# Patient Record
Sex: Male | Born: 1945
Health system: Southern US, Community
[De-identification: ages and names within clinical notes are randomized; demographics above are authoritative.]

## PROBLEM LIST (undated history)

## (undated) DIAGNOSIS — M109 Gout, unspecified: Secondary | ICD-10-CM

## (undated) DIAGNOSIS — E079 Disorder of thyroid, unspecified: Secondary | ICD-10-CM

## (undated) DIAGNOSIS — E785 Hyperlipidemia, unspecified: Secondary | ICD-10-CM

## (undated) DIAGNOSIS — I10 Essential (primary) hypertension: Secondary | ICD-10-CM

## (undated) HISTORY — DX: Gout, unspecified: M10.9

## (undated) HISTORY — DX: Hyperlipidemia, unspecified: E78.5

## (undated) HISTORY — DX: Essential (primary) hypertension: I10

## (undated) HISTORY — DX: Disorder of thyroid, unspecified: E07.9

## (undated) HISTORY — PX: JOINT REPLACEMENT: SHX530

## (undated) HISTORY — PX: BACK SURGERY: SHX140

---

## 1999-07-30 ENCOUNTER — Encounter: Payer: Self-pay | Admitting: Neurosurgery

## 1999-07-30 ENCOUNTER — Ambulatory Visit (HOSPITAL_COMMUNITY): Admission: RE | Admit: 1999-07-30 | Discharge: 1999-07-30 | Payer: Self-pay | Admitting: Neurosurgery

## 1999-08-13 ENCOUNTER — Encounter: Payer: Self-pay | Admitting: Neurosurgery

## 1999-08-13 ENCOUNTER — Ambulatory Visit (HOSPITAL_COMMUNITY): Admission: RE | Admit: 1999-08-13 | Discharge: 1999-08-13 | Payer: Self-pay | Admitting: Neurosurgery

## 1999-08-27 ENCOUNTER — Encounter: Payer: Self-pay | Admitting: Neurosurgery

## 1999-08-27 ENCOUNTER — Ambulatory Visit (HOSPITAL_COMMUNITY): Admission: RE | Admit: 1999-08-27 | Discharge: 1999-08-27 | Payer: Self-pay | Admitting: Neurosurgery

## 1999-10-11 ENCOUNTER — Encounter: Payer: Self-pay | Admitting: Neurosurgery

## 1999-10-15 ENCOUNTER — Inpatient Hospital Stay (HOSPITAL_COMMUNITY): Admission: RE | Admit: 1999-10-15 | Discharge: 1999-10-20 | Payer: Self-pay | Admitting: Neurosurgery

## 1999-10-15 ENCOUNTER — Encounter: Payer: Self-pay | Admitting: Neurosurgery

## 1999-12-19 ENCOUNTER — Encounter: Admission: RE | Admit: 1999-12-19 | Discharge: 1999-12-19 | Payer: Self-pay | Admitting: Neurosurgery

## 1999-12-19 ENCOUNTER — Encounter: Payer: Self-pay | Admitting: Neurosurgery

## 2000-02-27 ENCOUNTER — Encounter: Admission: RE | Admit: 2000-02-27 | Discharge: 2000-02-27 | Payer: Self-pay | Admitting: Neurosurgery

## 2000-02-27 ENCOUNTER — Encounter: Payer: Self-pay | Admitting: Neurosurgery

## 2000-05-28 ENCOUNTER — Encounter: Payer: Self-pay | Admitting: Neurosurgery

## 2000-05-28 ENCOUNTER — Encounter: Admission: RE | Admit: 2000-05-28 | Discharge: 2000-05-28 | Payer: Self-pay | Admitting: Neurosurgery

## 2004-09-12 ENCOUNTER — Ambulatory Visit: Payer: Self-pay

## 2007-06-18 DIAGNOSIS — E782 Mixed hyperlipidemia: Secondary | ICD-10-CM | POA: Insufficient documentation

## 2007-10-20 DIAGNOSIS — R6882 Decreased libido: Secondary | ICD-10-CM | POA: Insufficient documentation

## 2007-10-20 HISTORY — DX: Decreased libido: R68.82

## 2008-06-12 ENCOUNTER — Ambulatory Visit: Payer: Self-pay | Admitting: Gastroenterology

## 2008-07-10 DIAGNOSIS — F1010 Alcohol abuse, uncomplicated: Secondary | ICD-10-CM | POA: Insufficient documentation

## 2009-10-12 HISTORY — PX: LUMBAR FUSION: SHX111

## 2009-10-12 HISTORY — PX: LUMBAR LAMINECTOMY: SHX95

## 2011-12-02 ENCOUNTER — Emergency Department: Payer: Self-pay | Admitting: Emergency Medicine

## 2013-08-10 ENCOUNTER — Ambulatory Visit: Payer: Self-pay | Admitting: Orthopedic Surgery

## 2013-08-10 DIAGNOSIS — I10 Essential (primary) hypertension: Secondary | ICD-10-CM

## 2013-08-10 LAB — PROTIME-INR
INR: 1
PROTHROMBIN TIME: 12.8 s (ref 11.5–14.7)

## 2013-08-10 LAB — BASIC METABOLIC PANEL
ANION GAP: 4 — AB (ref 7–16)
BUN: 11 mg/dL (ref 7–18)
CALCIUM: 9.8 mg/dL (ref 8.5–10.1)
CHLORIDE: 95 mmol/L — AB (ref 98–107)
Co2: 31 mmol/L (ref 21–32)
Creatinine: 1.3 mg/dL (ref 0.60–1.30)
EGFR (Non-African Amer.): 57 — ABNORMAL LOW
Glucose: 103 mg/dL — ABNORMAL HIGH (ref 65–99)
Osmolality: 260 (ref 275–301)
Potassium: 4 mmol/L (ref 3.5–5.1)
Sodium: 130 mmol/L — ABNORMAL LOW (ref 136–145)

## 2013-08-10 LAB — APTT: ACTIVATED PTT: 24.8 s (ref 23.6–35.9)

## 2013-08-10 LAB — CBC
HCT: 42.7 % (ref 40.0–52.0)
HGB: 14.1 g/dL (ref 13.0–18.0)
MCH: 31.3 pg (ref 26.0–34.0)
MCHC: 33 g/dL (ref 32.0–36.0)
MCV: 95 fL (ref 80–100)
PLATELETS: 182 10*3/uL (ref 150–440)
RBC: 4.51 10*6/uL (ref 4.40–5.90)
RDW: 16.3 % — ABNORMAL HIGH (ref 11.5–14.5)
WBC: 4.8 10*3/uL (ref 3.8–10.6)

## 2013-08-10 LAB — URINALYSIS, COMPLETE
BILIRUBIN, UR: NEGATIVE
Bacteria: NONE SEEN
Blood: NEGATIVE
Glucose,UR: NEGATIVE mg/dL (ref 0–75)
Ketone: NEGATIVE
LEUKOCYTE ESTERASE: NEGATIVE
NITRITE: NEGATIVE
PH: 7 (ref 4.5–8.0)
PROTEIN: NEGATIVE
Specific Gravity: 1.015 (ref 1.003–1.030)
Squamous Epithelial: NONE SEEN
WBC UR: <1 /HPF (ref 0–5)

## 2013-08-10 LAB — MRSA PCR SCREENING

## 2013-08-12 HISTORY — PX: REPLACEMENT TOTAL KNEE: SUR1224

## 2013-08-25 ENCOUNTER — Inpatient Hospital Stay: Payer: Self-pay | Admitting: Orthopedic Surgery

## 2013-08-26 LAB — CBC WITH DIFFERENTIAL/PLATELET
Basophil #: 0 10*3/uL (ref 0.0–0.1)
Basophil %: 0.2 %
Eosinophil #: 0 10*3/uL (ref 0.0–0.7)
Eosinophil %: 0.2 %
HCT: 34.7 % — ABNORMAL LOW (ref 40.0–52.0)
HGB: 11.4 g/dL — ABNORMAL LOW (ref 13.0–18.0)
Lymphocyte #: 0.5 10*3/uL — ABNORMAL LOW (ref 1.0–3.6)
Lymphocyte %: 7.4 %
MCH: 30.9 pg (ref 26.0–34.0)
MCHC: 32.9 g/dL (ref 32.0–36.0)
MCV: 94 fL (ref 80–100)
MONO ABS: 0.7 x10 3/mm (ref 0.2–1.0)
Monocyte %: 10.4 %
NEUTROS PCT: 81.8 %
Neutrophil #: 5.3 10*3/uL (ref 1.4–6.5)
PLATELETS: 140 10*3/uL — AB (ref 150–440)
RBC: 3.69 10*6/uL — AB (ref 4.40–5.90)
RDW: 15.4 % — ABNORMAL HIGH (ref 11.5–14.5)
WBC: 6.5 10*3/uL (ref 3.8–10.6)

## 2013-08-26 LAB — BASIC METABOLIC PANEL
ANION GAP: 7 (ref 7–16)
BUN: 11 mg/dL (ref 7–18)
CHLORIDE: 98 mmol/L (ref 98–107)
CO2: 27 mmol/L (ref 21–32)
Calcium, Total: 8.2 mg/dL — ABNORMAL LOW (ref 8.5–10.1)
Creatinine: 1.61 mg/dL — ABNORMAL HIGH (ref 0.60–1.30)
GFR CALC AF AMER: 51 — AB
GFR CALC NON AF AMER: 44 — AB
Glucose: 110 mg/dL — ABNORMAL HIGH (ref 65–99)
OSMOLALITY: 265 (ref 275–301)
Potassium: 3.8 mmol/L (ref 3.5–5.1)
Sodium: 132 mmol/L — ABNORMAL LOW (ref 136–145)

## 2013-08-29 LAB — BASIC METABOLIC PANEL
ANION GAP: 6 — AB (ref 7–16)
BUN: 30 mg/dL — ABNORMAL HIGH (ref 7–18)
CALCIUM: 8.6 mg/dL (ref 8.5–10.1)
Chloride: 97 mmol/L — ABNORMAL LOW (ref 98–107)
Co2: 28 mmol/L (ref 21–32)
Creatinine: 1.97 mg/dL — ABNORMAL HIGH (ref 0.60–1.30)
EGFR (Non-African Amer.): 34 — ABNORMAL LOW
GFR CALC AF AMER: 40 — AB
GLUCOSE: 115 mg/dL — AB (ref 65–99)
OSMOLALITY: 270 (ref 275–301)
POTASSIUM: 2.9 mmol/L — AB (ref 3.5–5.1)
Sodium: 131 mmol/L — ABNORMAL LOW (ref 136–145)

## 2013-08-29 LAB — CBC WITH DIFFERENTIAL/PLATELET
Basophil #: 0 10*3/uL (ref 0.0–0.1)
Basophil %: 0.3 %
EOS PCT: 1.1 %
Eosinophil #: 0.1 10*3/uL (ref 0.0–0.7)
HCT: 28.7 % — AB (ref 40.0–52.0)
HGB: 9.6 g/dL — AB (ref 13.0–18.0)
LYMPHS ABS: 0.7 10*3/uL — AB (ref 1.0–3.6)
Lymphocyte %: 11.9 %
MCH: 31.2 pg (ref 26.0–34.0)
MCHC: 33.5 g/dL (ref 32.0–36.0)
MCV: 93 fL (ref 80–100)
Monocyte #: 0.7 x10 3/mm (ref 0.2–1.0)
Monocyte %: 12.6 %
NEUTROS ABS: 4.1 10*3/uL (ref 1.4–6.5)
Neutrophil %: 74.1 %
Platelet: 184 10*3/uL (ref 150–440)
RBC: 3.08 10*6/uL — ABNORMAL LOW (ref 4.40–5.90)
RDW: 15.1 % — ABNORMAL HIGH (ref 11.5–14.5)
WBC: 5.5 10*3/uL (ref 3.8–10.6)

## 2013-08-29 LAB — CLOSTRIDIUM DIFFICILE(ARMC)

## 2013-08-30 LAB — CREATININE, SERUM
CREATININE: 2.18 mg/dL — AB (ref 0.60–1.30)
EGFR (African American): 35 — ABNORMAL LOW
EGFR (Non-African Amer.): 30 — ABNORMAL LOW

## 2013-08-30 LAB — BASIC METABOLIC PANEL
Anion Gap: 7 (ref 7–16)
BUN: 32 mg/dL — ABNORMAL HIGH (ref 7–18)
CHLORIDE: 101 mmol/L (ref 98–107)
CO2: 25 mmol/L (ref 21–32)
Calcium, Total: 8.5 mg/dL (ref 8.5–10.1)
Creatinine: 2.3 mg/dL — ABNORMAL HIGH (ref 0.60–1.30)
EGFR (African American): 33 — ABNORMAL LOW
EGFR (Non-African Amer.): 29 — ABNORMAL LOW
Glucose: 99 mg/dL (ref 65–99)
Osmolality: 273 (ref 275–301)
POTASSIUM: 3.5 mmol/L (ref 3.5–5.1)
SODIUM: 133 mmol/L — AB (ref 136–145)

## 2013-08-30 LAB — MAGNESIUM: MAGNESIUM: 1.7 mg/dL — AB

## 2013-08-31 LAB — CBC WITH DIFFERENTIAL/PLATELET
Basophil #: 0 10*3/uL (ref 0.0–0.1)
Basophil %: 0.4 %
Eosinophil #: 0.1 10*3/uL (ref 0.0–0.7)
Eosinophil %: 1.7 %
HCT: 28 % — ABNORMAL LOW (ref 40.0–52.0)
HGB: 9.4 g/dL — ABNORMAL LOW (ref 13.0–18.0)
Lymphocyte #: 0.6 10*3/uL — ABNORMAL LOW (ref 1.0–3.6)
Lymphocyte %: 9.5 %
MCH: 31.2 pg (ref 26.0–34.0)
MCHC: 33.6 g/dL (ref 32.0–36.0)
MCV: 93 fL (ref 80–100)
MONOS PCT: 9 %
Monocyte #: 0.6 x10 3/mm (ref 0.2–1.0)
Neutrophil #: 5 10*3/uL (ref 1.4–6.5)
Neutrophil %: 79.4 %
PLATELETS: 216 10*3/uL (ref 150–440)
RBC: 3.02 10*6/uL — ABNORMAL LOW (ref 4.40–5.90)
RDW: 15.2 % — AB (ref 11.5–14.5)
WBC: 6.2 10*3/uL (ref 3.8–10.6)

## 2013-08-31 LAB — BASIC METABOLIC PANEL
ANION GAP: 6 — AB (ref 7–16)
BUN: 23 mg/dL — ABNORMAL HIGH (ref 7–18)
Calcium, Total: 8.9 mg/dL (ref 8.5–10.1)
Chloride: 100 mmol/L (ref 98–107)
Co2: 26 mmol/L (ref 21–32)
Creatinine: 1.56 mg/dL — ABNORMAL HIGH (ref 0.60–1.30)
EGFR (African American): 53 — ABNORMAL LOW
EGFR (Non-African Amer.): 46 — ABNORMAL LOW
Glucose: 110 mg/dL — ABNORMAL HIGH (ref 65–99)
OSMOLALITY: 269 (ref 275–301)
POTASSIUM: 3.5 mmol/L (ref 3.5–5.1)
Sodium: 132 mmol/L — ABNORMAL LOW (ref 136–145)

## 2014-05-24 DIAGNOSIS — N182 Chronic kidney disease, stage 2 (mild): Secondary | ICD-10-CM | POA: Diagnosis not present

## 2014-05-24 DIAGNOSIS — I10 Essential (primary) hypertension: Secondary | ICD-10-CM | POA: Diagnosis not present

## 2014-08-05 NOTE — Op Note (Signed)
PATIENT NAME:  Lonnie Huber, Lonnie Huber MR#:  [id number] DATE OF BIRTH:  1945/12/15  DATE OF PROCEDURE:  08/25/2013  PREOPERATIVE DIAGNOSIS: Right knee tricompartmental osteoarthritis.   POSTOPERATIVE DIAGNOSIS:  Right knee tricompartmental osteoarthritis.   PROCEDURE: Right total knee arthroplasty.   SURGEON: Thornton Park, MD   ASSISTANT: Earnestine Leys, MD  ESTIMATED BLOOD LOSS: 50 mL.   COMPLICATIONS: None.   ANESTHESIA: Spinal.   TOURNIQUET TIME: 125 minutes.   IMPLANTS: DePuy Sigma size 4 posterior stabilized femoral component, DePuy size 5 rotating tibial platform and a DePuy tibial posterior articulated size 4 polyethylene insert, a DePuy 41 mm polyethylene patella component.   INDICATIONS FOR SURGERY: The patient is healthy 69 year old male who has had persistent right knee pain. He has advanced degenerative changes on x-ray including severe joint space narrowing, particularly in the lateral compartment, with marginal osteophyte formation, subchondral sclerosis and subchondral cyst formation. There are degenerative changes in the tricompartmental in all 3 compartments of the knee. I reviewed the risks and benefits of surgery with the patient prior to the date of surgery in my office. He understands the risks include infection requiring the removal of the prosthesis, nerve or blood vessel injury, knee stiffness, bleeding requiring blood transfusion, fracture, dislocation, failure of the hardware, persistent pain or instability and the need for further surgery. Medical risks include deep vein thrombosis and pulmonary embolism, myocardial infarction, stroke, pneumonia, respiratory failure and death. The patient understood these risks and signed consent form in my office.   PROCEDURE NOTE: The patient was met in the preoperative area on the morning of surgery. I marked the right knee with the word "yes" within the operative field according to the hospital's right site protocol. His history and  physical was updated. The patient was then brought to the Operating Room where he underwent a spinal anesthetic by the anesthesia service. He was then positioned supine on the operative table. A Foley catheter was inserted. He was prepped and draped in a sterile fashion. A tourniquet was applied to the right upper thigh. He was prepped and draped in sterile fashion.   A timeout was performed to verify the patient's name, date of birth, medical record number, correct site of surgery and correct procedure to be performed. It was used to confirm the patient had received antibiotics and that all appropriate instruments, implants and radiographic studies were available in the room. Once all in attendance were in agreement, the case began.   A midline incision was made with a #10 blade. Full-thickness skin flaps were created overlying the patient's right knee. A medial arthrotomy was then created using a deep #10 blade. The right patella was then reflected and the knee was flexed approximately 90 degrees. This allowed for debridement of the Hoffa's fat pad, the medial and lateral meniscus and the cruciate ligaments. Osteophytes were removed using a rongeur from the femur. A starting drill was then applied to the intercondylar notch of the femur and inserted into the femoral canal. A long intramedullary distal femoral cutting guide was then positioned in place. The patient had had approximately 5 to 10 degrees of flexion contracture. A distal femoral cut of 10 cm was then planned. Once the distal femoral cutting block was pinned into position, the intramedullary rod was removed. An oscillating saw was then used to create the distal femoral cut.   Attention was turned to the tibia. The external tibial alignment guide was then applied to the lower leg. This was set up to be in  alignment with the second ray of the right foot. The slope of the cutting guide was matched to the anterior tibia. Approximately 8 mm off the  medial high side was measured using a stylus. The proximal tibial cutting block was then pinned into position. A drop-down alignment guide was used to confirm a neutral alignment with the mechanical axis of the tibia. A cross pin was then placed. An oscillating saw was used to create the proximal tibial cut. The knee was then put into full extension and the extension block was used to measure ligament balancing. The patient was slightly tight medially and further osteophytes were removed from the medial tibia and an increased medial soft tissue sleeve was created to allow for partial release the medial collateral ligament.   The attention was then turned back to the distal femur. A posterior referencing guide was then applied to the distal femur and pinned into position. This allowed for 3 degrees of external rotation. The distal femur was then measured to be a size 4. The size 4 cutting block was then applied to the distal femur and pinned into position. The anterior, posterior and chamfer cuts were then made with this cutting block. The cutting block was then removed. The box cutting guide was then pinned into position on the distal femur. The intercondylar notch box was then created with an oscillating saw. This box was then rasped. The cutting block was then removed. The flexion block was then placed into position and had excellent ligament balancing.   The attention was turned to the tibia. A size 5 tibial tray had the best coverage of the tibial plateau. This was pinned into position. The reamer guide was then gently malleted into position on top of the tibial tray. The size 5 reamer was then used to create the entry hole into the proximal tibia and the flanges were created using a tibial punch. The pins were then removed. A size 10 trial was then applied. The knee was brought into full extension and found to have excellent range of motion and ligamentous stability. This occurred in both flexion and  extension in the midrange of flexion.   The attention was then turned to the patella. The patella was measured to be 26 mm in width. The best coverage was had with a size 41 patella. The patella cutting guide was then applied and approximately 11-1/2 to 12 mm was resected from the undersurface of the patella. Three peg holes were then drilled and a 41 mm patella button trial was applied. The knee was then taken through a full range of motion. There was excellent tracking of the patella.   All trial components were then removed. The knee was copiously irrigated using pulse lavage impregnated with GU. The center drill hole of the femur used for the intramedullary guide was then applied with bone grafting from the patient. After a secondary pulse lavage, all bony surfaces were dried. Methylmethacrylate was mixed on the back table and applied to all exposed bony surfaces. The tibial tray was then put into position first along with a posterior stabilized DePuy size 4 PFC Sigma component. A size 10 polyethylene trial was then placed and the knee was brought into full extension while the methylmethacrylate cured. The size 41 patella button was then applied to the undersurface of the patella using methylmethacrylate.   After the methylmethacrylate had cured, the knee was copiously irrigated. The patient received a dose of tranexamic acid to help with postop bleeding. The  actual 10 mm size 4 polyethylene insert was then placed.  Two limbs of Autovac drain were placed into the knee joint. The patient then had the medial arthrotomy closed with interrupted #1 Ethibond. Again, the wound was copiously irrigated using pulse lavage impregnated with GU antibiotics. The subcutaneous tissue was closed in 2 layers with 0 Vicryl and 2-0 Vicryl. The skin was approximated with staples. A dry sterile dressing was applied along with a Polar Care sleeve and then a knee immobilizer. The patient was then transferred to a hospital bed in  stable condition.   I was scrubbed and present for the entire case. I spoke with the patient's wife in the postoperative consultation room following the procedure to let her know the case had gone without complication and the patient was stable in the recovery room.   ____________________________ Timoteo Gaul, MD klk:cs D: 08/30/2013 14:12:28 ET T: 08/30/2013 14:53:48 ET JOB#: [id number]  cc: Timoteo Gaul, MD, <Dictator> Timoteo Gaul MD ELECTRONICALLY SIGNED 09/13/2013 23:16

## 2014-08-05 NOTE — Consult Note (Signed)
Brief Consult Note: Diagnosis: 69 yr old male with right knee arthoplasty with acute renal failure and hypokalemia.   Patient was seen by consultant.   Consult note dictated.   Comments: 69 yr old male with acute renal failure with hypokaemia:likley  Atn with hypotension and diarrhea related: now  recommend holding hctz and cozzar ,did not get  this am also;continue fluids,replace k ,recommned holding hctz at discharge for 2-3 days and can resume after that.may have  ckd as well.  Electronic Signatures: Katha HammingKonidena, Jerrold Haskell (MD)  (Signed 18-May-15 14:05)  Authored: Brief Consult Note   Last Updated: 18-May-15 14:05 by Katha HammingKonidena, Jari Dipasquale (MD)

## 2014-08-05 NOTE — Consult Note (Signed)
PATIENT NAME:  Lonnie Huber, Lonnie Huber MR#:  [id number] DATE OF BIRTH:  1945/10/01  DATE OF CONSULTATION:  08/29/2013  REFERRING PHYSICIAN:  Juanell FairlyKevin Krasinski, MD CONSULTING PHYSICIAN:  Katha HammingSnehalatha Ladonne Sharples, MD  PRIMARY CARE PHYSICIAN: Dennison MascotLemont Morrisey, MD  REASON FOR CONSULTATION: Acute renal failure, hypokalemia.   HISTORY OF PRESENT ILLNESS: The patient is a 69 year old male admitted to ortho service for osteoarthritis of the right knee and had a right total knee arthroplasty on May 14th. The patient's routine lab work today showed hyponatremia and hypokalemia with potassium of 2.9 and sodium 131. Dr. Martha ClanKrasinski called me for acute renal failure and hyperkalemia evaluation. The patient has been on Benicar and HCTZ since admission. The patient's blood pressure was low this morning, at around 90/60, so nurse did not give the medications for the blood pressure. the patient did not have any nausea, but has been having frequent stools since yesterday. The patient denies any other complaints. No abdominal pain and no fever here. The patient is progressing well with physical therapy.   PAST MEDICAL HISTORY: Significant for hypertension.   ALLERGIES: No known drug allergies.   SOCIAL HISTORY: No smoking. Occasional drinking. No drugs.   HOME MEDICATIONS: He takes Benicar/HCTZ 12.5/40 mg p.o. daily, Lipitor 40 mg daily, Enalapril 300 mg once a day.   CURRENT MEDICATIONS: He is on Ambien 5 mg p.r.n. for sleep. He is on olmesartan 40 mg daily, atorvastatin 40 mg daily,  HCTZ 12.5 mg p.o. daily.   REVIEW OF SYSTEMS:  CONSTITUTIONAL: Denies any fever. HEENT: The patient has no ear pain. No epistaxis. No difficulty swallowing.  GASTROINTESTINAL: No nausea. No vomiting. No abdominal pain.  GENITOURINARY: No dysuria. PULMONARY: Denies any trouble breathing.  CARDIOVASCULAR: No chest pain.  NEUROLOGIC: No history of strokes or TIAs.  PSYCH: The patient has no history of depression.  PHYSICAL  EXAMINATION: VITAL SIGNS: Temperature 97.9, heart rate 90, blood pressure this morning was 90/60 and repeat was 101/60.  GENERAL: The patient is alert, awake and oriented, an elderly male not in distress, answering questions appropriately.  HEAD: Atraumatic, normocephalic.  EYES: Pupils equal and reacting to light. Extraocular movements are intact.  EARS, NOSE, THROAT: No tympanic membrane congestion. No turbinate hypertrophy. No oropharyngeal erythema.  NECK: Supple. No JVD or carotid bruits.  HEART: S1 and S2 regular. No murmurs. Peripheral pulses intact, in pedal and dorsalis pedis. LUNGS: Clear to auscultation. No wheeze. No rales.  ABDOMEN: Soft, nontender, nondistended. Bowel sounds present. EXTREMITIES: Right knee covered with cold wrap. The patient has no clubbing or edema in the legs.  NEUROLOGIC: Alert, awake, oriented. No focal neurologic deficit. Cranial nerves II through XII intact. Power 5/5 in upper and lower extremities. Sensory intact. DTRs 2+ bilateral.  PSYCH: Mood and affect within normal limits.   DIAGNOSTIC DATA: Sodium is 131, potassium 2.9, chloride 97, bicarb 28, BUN 30, creatinine 1.97, glucose 115.  WBC 5.5, hemoglobin 9.6, hematocrit 28.7, platelets 184,000.  The patient's C. diff was sent this morning. It was negative.   Most recent labs on May 15th showed creatinine 1.61.   ASSESSMENT AND PLAN: The patient is a 69 year old male with right knee surgery found to have acute renal failure and hypokalemia, likely secondary to combination of diarrhea and taking HCTZ. I would recommend holding the HCTZ and Benicar at this time and start gentle hydration with potassium at 50 mL/h. Check BMP in the morning. If the patient's renal function improves, he will probably go home. Advised him to stop HCTZ for  2 days and restart it after. The patient may have acute renal failure and chronic kidney disease stage III. This patient needs followup with primary doctor once his knee  problem resolves to follow upon his kidney disease.   TIME SPENT: About 55 minutes.  ____________________________ Katha Hamming, MD sk:sb D: 08/29/2013 14:02:18 ET T: 08/29/2013 14:43:56 ET JOB#: [id number]  cc: Katha Hamming, MD, <Dictator> Katha Hamming MD ELECTRONICALLY SIGNED 09/08/2013 12:34

## 2014-08-05 NOTE — Discharge Summary (Signed)
PATIENT NAME:  Lonnie Huber, CONSALVO MR#:  [id number] DATE OF BIRTH:  05-25-1945  DATE OF ADMISSION:  08/25/2013 DATE OF DISCHARGE:  08/31/2013  ADMITTING DIAGNOSIS: Status post right total knee arthroplasty.   DISCHARGE DIAGNOSES:   1.  Status post right total knee arthroplasty.  2.  Acute on chronic kidney disease.  3.  Hypokalemia, hypomagnesemia.  4.  Lower extremity edema postoperative.   HOSPITAL COURSE: Mr. Clemons is a 69 year old male who was admitted on May 14th after an uncomplicated right total knee arthroplasty. He is admitted to the orthopedic floor. On postoperative day #1, he was already out of bed to a chair and doing well. His pain was well controlled. He denied any shortness breath, chest pain or abdominal pain. His wife is at the bedside. His labs showed no evidence of acute blood loss anemia. His vital signs were stable. His Hemovac drain was removed. He had begun Lovenox for deep vein thrombosis prophylaxis. He had TED stockings as well as AV ones in place for also for deep vein thrombosis prophylaxis. His Foley catheter was draining urine. He had no calf tenderness. He had begun physical and occupational therapy. On postoperative day #2, the patient was alert feeling well. He continued with physical therapy. On postoperative day #3, again he was doing well. His dressing had been changed on postoperative day #2 and his Foley catheter had been removed as well. He was improving with physical therapy. On postoperative day #4, the patient was feeling well. His pain was controlled and he was making progress with physical therapy. He had increased stool noted by the nurse and he was sent for a C. difficile test which was negative. He was also noted to have an elevated creatinine and was hyponatremic and hypokalemic. I consulted the hospitalists. They held his hypertensive medication and supported him with fluids and repleted his potassium. They recommended holding his hydrochlorothiazide after  discharge for 2 to 3 days.   On postoperative day #5, the patient was in a new room on the floor. He was doing well. He had no complaints of knee pain and he was up out of bed to a chair. The nurses noted that he had mild nausea, but no vomiting. The nurse had also noted right lower extremity edema. An ultrasound was ordered and it was negative for deep vein thrombosis. The patient had a history of deep vein thrombosis after a previous spine surgery. His creatinine was elevated. His potassium had normalized. His sodium had improved but his magnesium was found to be low. The hospitalist consulted the nephrologist given the patient's persistent elevated creatinine. They recommended supportive care and avoid hypotension and nonsteroidal anti-inflammatories. By postoperative day #6, the patient's creatinine had improved. The patient again was doing well. He had no significant knee pain. The patient was cleared for discharge by nephrology and the hospitalist service as well. He was ready for discharge home.   DISCHARGE INSTRUCTIONS: The patient was discharged home with instructions to continue Lovenox for deep vein thrombosis prophylaxis. Given his kidney disease, enteric-coated aspirin was not a good choice, especially in the setting of a previous deep vein thrombosis. He will be partial weight-bearing on his right lower extremity using his walker. He will continue to elevate the lower extremity whenever possible and use his Polar Care to reduce swelling. He was also encouraged to continue using his incentive spirometry. He will be set up with home physical therapy and follow up with me in 10 days. The patient will  need to follow up with his primary care physician in the next couple weeks to re-evaluate his kidney function and to adjust and potentially restart his hypertensive medications.   ____________________________ Kathreen DevoidKevin L. Donne Baley, MD klk:cs D: 09/14/2013 18:17:45 ET T: 09/14/2013 18:41:56  ET JOB#: [id number]  cc: Kathreen DevoidKevin L. Linwood Gullikson, MD, <Dictator> Kathreen DevoidKEVIN L Delmar Arriaga MD ELECTRONICALLY SIGNED 09/15/2013 12:38

## 2014-09-20 ENCOUNTER — Ambulatory Visit (INDEPENDENT_AMBULATORY_CARE_PROVIDER_SITE_OTHER): Payer: Commercial Managed Care - HMO | Admitting: Family Medicine

## 2014-09-20 ENCOUNTER — Encounter: Payer: Self-pay | Admitting: Family Medicine

## 2014-09-20 VITALS — BP 124/82 | HR 110 | Temp 97.8°F | Resp 18 | Wt 205.8 lb

## 2014-09-20 DIAGNOSIS — G8929 Other chronic pain: Secondary | ICD-10-CM | POA: Diagnosis not present

## 2014-09-20 DIAGNOSIS — M109 Gout, unspecified: Secondary | ICD-10-CM | POA: Insufficient documentation

## 2014-09-20 DIAGNOSIS — M545 Low back pain, unspecified: Secondary | ICD-10-CM | POA: Insufficient documentation

## 2014-09-20 DIAGNOSIS — N182 Chronic kidney disease, stage 2 (mild): Secondary | ICD-10-CM | POA: Insufficient documentation

## 2014-09-20 DIAGNOSIS — M25561 Pain in right knee: Secondary | ICD-10-CM

## 2014-09-20 DIAGNOSIS — I10 Essential (primary) hypertension: Secondary | ICD-10-CM

## 2014-09-20 DIAGNOSIS — M1000 Idiopathic gout, unspecified site: Secondary | ICD-10-CM

## 2014-09-20 DIAGNOSIS — F1010 Alcohol abuse, uncomplicated: Secondary | ICD-10-CM | POA: Diagnosis not present

## 2014-09-20 DIAGNOSIS — E039 Hypothyroidism, unspecified: Secondary | ICD-10-CM | POA: Insufficient documentation

## 2014-09-20 DIAGNOSIS — K5730 Diverticulosis of large intestine without perforation or abscess without bleeding: Secondary | ICD-10-CM | POA: Insufficient documentation

## 2014-09-20 DIAGNOSIS — N529 Male erectile dysfunction, unspecified: Secondary | ICD-10-CM

## 2014-09-20 HISTORY — DX: Alcohol abuse, uncomplicated: F10.10

## 2014-09-20 HISTORY — DX: Male erectile dysfunction, unspecified: N52.9

## 2014-09-20 MED ORDER — LOSARTAN POTASSIUM-HCTZ 100-25 MG PO TABS: 1.0000 | ORAL_TABLET | Freq: Every day | ORAL | Status: DC

## 2014-09-20 NOTE — Progress Notes (Signed)
Name: Lonnie Huber   MRN: [id number]    DOB: Aug 27, 1945   Date:09/20/2014       Progress Note  Subjective  Chief Complaint  Chief Complaint  Patient presents with  . Hypertension  . Hyperlipidemia  . Chronic Kidney Disease    Hypertension This is a chronic problem. The current episode started more than 1 month ago. The problem is unchanged. The problem is controlled. Associated symptoms include anxiety. Pertinent negatives include no blurred vision, chest pain, headaches, neck pain, orthopnea, palpitations or shortness of breath. There are no associated agents to hypertension. Risk factors for coronary artery disease include dyslipidemia, male gender, sedentary lifestyle and smoking/tobacco exposure. Past treatments include angiotensin blockers and diuretics. The current treatment provides moderate improvement. There are no compliance problems.  There is no history of angina or CAD/MI.  Hyperlipidemia This is a chronic problem. The current episode started more than 1 year ago. The problem is controlled. Recent lipid tests were reviewed and are normal. Factors aggravating his hyperlipidemia include fatty foods and thiazides. Pertinent negatives include no chest pain, focal weakness, myalgias or shortness of breath. Current antihyperlipidemic treatment includes statins. The current treatment provides moderate improvement of lipids. Compliance problems include medication cost and adherence to exercise.  Risk factors for coronary artery disease include dyslipidemia, hypertension, male sex and a sedentary lifestyle.  Arthritis Presents for follow-up visit. The disease course has been stable. He complains of pain, stiffness and joint swelling. The symptoms have been improving. Affected locations include the left knee. Pertinent negatives include no diarrhea, dysuria, fever or weight loss. His past medical history is significant for osteoarthritis. His pertinent risk factors include overuse. Past  treatments include surgery and acetaminophen. The treatment provided moderate relief. Compliance with prior treatments has been variable. Prior compliance problems include insurance issues and medication issues.   CKD FOLLOWED BY NEPHROLOGIST AND DOING WELL  NOW WELL CONTROLLED   ALCOHOL  HAS CONTINUED TO DRINK MOST DAYS OF WK  GOUT No recent flare up.      Past Medical History  Diagnosis Date  . Thyroid disease   . Hypertension   . Hyperlipidemia     History  Substance Use Topics  . Smoking status: Former Smoker    Quit date: 09/20/1978  . Smokeless tobacco: Not on file  . Alcohol Use: 0.0 oz/week    0 Standard drinks or equivalent per week     Current outpatient prescriptions:  .  allopurinol (ZYLOPRIM) 300 MG tablet, Take 300 mg by mouth daily., Disp: , Rfl:  .  atorvastatin (LIPITOR) 40 MG tablet, Take 40 mg by mouth daily., Disp: , Rfl:  .  olmesartan-hydrochlorothiazide (BENICAR HCT) 40-12.5 MG per tablet, Take 1 tablet by mouth daily., Disp: , Rfl:   No Known Allergies  Review of Systems  Constitutional: Negative for fever, chills and weight loss.  HENT: Negative for congestion, hearing loss, sore throat and tinnitus.   Eyes: Negative for blurred vision, double vision and redness.  Respiratory: Negative for cough, hemoptysis and shortness of breath.   Cardiovascular: Negative for chest pain, palpitations, orthopnea, claudication and leg swelling.  Gastrointestinal: Negative for heartburn, nausea, vomiting, diarrhea, constipation and blood in stool.  Genitourinary: Negative for dysuria, urgency, frequency and hematuria.  Musculoskeletal: Positive for joint swelling, arthritis and stiffness. Negative for myalgias, back pain, joint pain, falls and neck pain.  Skin: Negative for itching.  Neurological: Negative for dizziness, tingling, tremors, focal weakness, seizures, loss of consciousness, weakness and headaches.  Endo/Heme/Allergies:  Does not bruise/bleed  easily.  Psychiatric/Behavioral: Negative for depression and substance abuse. The patient is not nervous/anxious and does not have insomnia.      Objective  Filed Vitals:   09/20/14 0912  BP: 124/82  Pulse: 110  Temp: 97.8 F (36.6 C)  TempSrc: Oral  Resp: 18  Weight: 205 lb 12.8 oz (93.35 kg)  SpO2: 98%     Physical Exam  Constitutional: He is oriented to person, place, and time and well-developed, well-nourished, and in no distress.  HENT:  Head: Normocephalic.  Eyes: EOM are normal. Pupils are equal, round, and reactive to light.  Neck: Normal range of motion. Neck supple. No thyromegaly present.  Cardiovascular: Normal rate, regular rhythm and normal heart sounds.   No murmur heard. Pulmonary/Chest: Effort normal and breath sounds normal. No respiratory distress. He has no wheezes.  Abdominal: Soft. Bowel sounds are normal.  Musculoskeletal: Normal range of motion. He exhibits no edema.       Right knee: Normal.       Legs: Lymphadenopathy:    He has no cervical adenopathy.  Neurological: He is alert and oriented to person, place, and time. No cranial nerve deficit. Gait normal. Coordination normal.  Skin: Skin is warm and dry. No rash noted.  Psychiatric: Affect and judgment normal.      Assessment & Plan  1. Benign essential HTN stable - Lipid Profile; Future - Comp Met (CMET); Future - Lipid Profile - Comp Met (CMET) - losartan-hydrochlorothiazide (HYZAAR) 100-25 MG per tablet; Take 1 tablet by mouth daily.  Dispense: 90 tablet; Refill: 1  2. Chronic LBP stable  3. Idiopathic gout, unspecified chronicity, unspecified site stable - TSH; Future - Uric acid; Future   4. Alcohol abuse recurrent

## 2014-09-20 NOTE — Patient Instructions (Signed)
F/U 4 MO 

## 2014-09-21 LAB — COMPREHENSIVE METABOLIC PANEL
A/G RATIO: 1.3 (ref 1.1–2.5)
ALBUMIN: 3.9 g/dL (ref 3.6–4.8)
ALK PHOS: 93 IU/L (ref 39–117)
ALT: 16 IU/L (ref 0–44)
AST: 30 IU/L (ref 0–40)
BILIRUBIN TOTAL: 1 mg/dL (ref 0.0–1.2)
BUN / CREAT RATIO: 10 (ref 10–22)
BUN: 12 mg/dL (ref 8–27)
CHLORIDE: 96 mmol/L — AB (ref 97–108)
CO2: 20 mmol/L (ref 18–29)
Calcium: 9.5 mg/dL (ref 8.6–10.2)
Creatinine, Ser: 1.23 mg/dL (ref 0.76–1.27)
GFR calc non Af Amer: 60 mL/min/{1.73_m2} (ref >59)
GFR, EST AFRICAN AMERICAN: 70 mL/min/{1.73_m2} (ref >59)
GLOBULIN, TOTAL: 3.1 g/dL (ref 1.5–4.5)
GLUCOSE: 91 mg/dL (ref 65–99)
POTASSIUM: 5 mmol/L (ref 3.5–5.2)
Sodium: 140 mmol/L (ref 134–144)
TOTAL PROTEIN: 7 g/dL (ref 6.0–8.5)

## 2014-09-21 LAB — LIPID PANEL
CHOL/HDL RATIO: 2.5 ratio (ref 0.0–5.0)
CHOLESTEROL TOTAL: 141 mg/dL (ref 100–199)
HDL: 56 mg/dL (ref >39)
LDL Calculated: 68 mg/dL (ref 0–99)
Triglycerides: 86 mg/dL (ref 0–149)
VLDL CHOLESTEROL CAL: 17 mg/dL (ref 5–40)

## 2014-09-21 LAB — TSH: TSH: 1.81 u[IU]/mL (ref 0.450–4.500)

## 2014-09-21 LAB — URIC ACID: URIC ACID: 3.2 mg/dL — AB (ref 3.7–8.6)

## 2014-09-22 NOTE — Progress Notes (Signed)
Pt aware of results 

## 2014-12-21 ENCOUNTER — Encounter: Payer: Self-pay | Admitting: Family Medicine

## 2014-12-21 ENCOUNTER — Telehealth: Payer: Self-pay | Admitting: Family Medicine

## 2014-12-21 ENCOUNTER — Ambulatory Visit (INDEPENDENT_AMBULATORY_CARE_PROVIDER_SITE_OTHER): Payer: Commercial Managed Care - HMO | Admitting: Family Medicine

## 2014-12-21 VITALS — BP 130/70 | HR 90 | Temp 98.6°F | Resp 18 | Ht 71.0 in | Wt 201.2 lb

## 2014-12-21 DIAGNOSIS — Z23 Encounter for immunization: Secondary | ICD-10-CM

## 2014-12-21 DIAGNOSIS — E038 Other specified hypothyroidism: Secondary | ICD-10-CM | POA: Diagnosis not present

## 2014-12-21 DIAGNOSIS — M10019 Idiopathic gout, unspecified shoulder: Secondary | ICD-10-CM | POA: Diagnosis not present

## 2014-12-21 DIAGNOSIS — N182 Chronic kidney disease, stage 2 (mild): Secondary | ICD-10-CM | POA: Diagnosis not present

## 2014-12-21 DIAGNOSIS — M1000 Idiopathic gout, unspecified site: Secondary | ICD-10-CM

## 2014-12-21 DIAGNOSIS — I10 Essential (primary) hypertension: Secondary | ICD-10-CM | POA: Diagnosis not present

## 2014-12-21 MED ORDER — ATORVASTATIN CALCIUM 40 MG PO TABS: 40.0000 mg | ORAL_TABLET | Freq: Every day | ORAL | Status: DC

## 2014-12-21 MED ORDER — LOSARTAN POTASSIUM-HCTZ 100-25 MG PO TABS: 1.0000 | ORAL_TABLET | Freq: Every day | ORAL | Status: DC

## 2014-12-21 MED ORDER — ALLOPURINOL 300 MG PO TABS: 300.0000 mg | ORAL_TABLET | Freq: Every day | ORAL | Status: DC

## 2014-12-21 NOTE — Telephone Encounter (Signed)
Pt would like to a call back.

## 2014-12-21 NOTE — Progress Notes (Signed)
Name: Lonnie Huber   MRN: [id number]    DOB: 1945/09/06   Date:12/21/2014       Progress Note  Subjective  Chief Complaint  Chief Complaint  Patient presents with  . Hypertension  . Hyperlipidemia  . Gout    HPI   Hypertension   Patient presents for follow-up of hypertension. It has been present for over greater than 10 years years.  Patient states that there is compliance with medical regimen which consists of losartan HCT C1 100-25 once daily . There is no end organ disease. Cardiac risk factors include hypertension hyperlipidemia .  Exercise regimen consist of  walking regularly.  Diet consist of low sodium usually.  Hyperlipidemia  Patient has a history of hyperlipidemia for 10+ years.  Current medical regimen consist of losartan HCT .  Compliance is good .  Diet and exercise are currently followed atorvastatin 40 mg daily at bedtime .  Risk factors for cardiovascular disease include hyperlipidemia , hypertension L sex and age over 61 .   There have been no side effects from the medication.    Gout  There's been a recent flareup of gout. He continues on his allopurinol.  Intermittent alcohol abuse  Patient states that he did drink some extra alcohol during the holiday season but has not been drinking on a regular basis anymore. There've been no domestic or relational issues as far as any alcohol usage. He is currently retired  Osteoarthritis  Patient is doing better after his knee replacement. He walks significantly during his recent [place] no significant problems.  Past Medical History  Diagnosis Date  . Thyroid disease   . Hypertension   . Hyperlipidemia   . Gout     Social History  Substance Use Topics  . Smoking status: Former Smoker    Quit date: 09/20/1978  . Smokeless tobacco: Not on file  . Alcohol Use: 0.0 oz/week    0 Standard drinks or equivalent per week     Current outpatient prescriptions:  .  Omega 3 1000 MG CAPS, Take 1 capsule  by mouth 2 (two) times daily., Disp: , Rfl:  .  allopurinol (ZYLOPRIM) 300 MG tablet, Take 1 tablet (300 mg total) by mouth daily., Disp: 30 tablet, Rfl: 2 .  atorvastatin (LIPITOR) 40 MG tablet, Take 1 tablet (40 mg total) by mouth daily., Disp: 90 tablet, Rfl: 1 .  losartan-hydrochlorothiazide (HYZAAR) 100-25 MG per tablet, Take 1 tablet by mouth daily., Disp: 90 tablet, Rfl: 1  No Known Allergies  Review of Systems  Constitutional: Negative for fever, chills and weight loss.  HENT: Negative for congestion, hearing loss, sore throat and tinnitus.   Eyes: Negative for blurred vision, double vision and redness.  Respiratory: Negative for cough, hemoptysis and shortness of breath.   Cardiovascular: Negative for chest pain, palpitations, orthopnea, claudication and leg swelling.  Gastrointestinal: Negative for heartburn, nausea, vomiting, diarrhea, constipation and blood in stool.  Genitourinary: Negative for dysuria, urgency, frequency and hematuria.       Erectile dysfunction  Musculoskeletal: Positive for joint pain. Negative for myalgias, back pain, falls and neck pain.  Skin: Negative for itching.  Neurological: Negative for dizziness, tingling, tremors, focal weakness, seizures, loss of consciousness, weakness and headaches.  Endo/Heme/Allergies: Does not bruise/bleed easily.  Psychiatric/Behavioral: Negative for depression and substance abuse. The patient is not nervous/anxious and does not have insomnia.      Objective  Filed Vitals:   12/21/14 0741  BP: 130/70  Pulse: 90  Temp: 98.6 F (37 C)  TempSrc: Oral  Resp: 18  Height: 5\' 11"  (1.803 m)  Weight: 201 lb 3.2 oz (91.264 kg)  SpO2: 94%     Physical Exam  Constitutional: He is oriented to person, place, and time and well-developed, well-nourished, and in no distress.  HENT:  Head: Normocephalic.  Eyes: EOM are normal. Pupils are equal, round, and reactive to light.  Neck: Normal range of motion. Neck supple. No  thyromegaly present.  Cardiovascular: Normal rate, regular rhythm and normal heart sounds.   No murmur heard. Pulmonary/Chest: Effort normal and breath sounds normal. No respiratory distress. He has no wheezes.  Abdominal: Soft. Bowel sounds are normal.  Musculoskeletal: He exhibits no edema.  Old surgical scars over the knees  Lymphadenopathy:    He has no cervical adenopathy.  Neurological: He is alert and oriented to person, place, and time. No cranial nerve deficit. Gait normal. Coordination normal.  Skin: Skin is warm and dry. No rash noted.  Psychiatric: Affect and judgment normal.      Assessment & Plan  1. Benign essential HTN Well-controlled - losartan-hydrochlorothiazide (HYZAAR) 100-25 MG per tablet; Take 1 tablet by mouth daily.  Dispense: 90 tablet; Refill: 1  2. Other specified hypothyroidism Stable  3. Chronic kidney disease (CKD), stage II (mild) Resolved  4. Acute idiopathic gout of shoulder, unspecified laterality Stable  5. Need for pneumococcal vaccination Given today - Pneumococcal polysaccharide vaccine 23-valent greater than or equal to 2yo subcutaneous/IM  6. Idiopathic gout, unspecified chronicity, unspecified site Stable

## 2014-12-27 ENCOUNTER — Telehealth: Payer: Self-pay | Admitting: Emergency Medicine

## 2014-12-29 MED ORDER — TADALAFIL 20 MG PO TABS: 10.0000 mg | ORAL_TABLET | ORAL | Status: DC | PRN

## 2014-12-29 NOTE — Telephone Encounter (Signed)
Script for cialis sent to pharmacy

## 2015-03-21 ENCOUNTER — Telehealth: Payer: Self-pay | Admitting: Family Medicine

## 2015-03-21 NOTE — Telephone Encounter (Signed)
ERRENOUS °

## 2015-03-21 NOTE — Telephone Encounter (Signed)
Pt needs refill on Cialis [facility]

## 2015-03-22 NOTE — Telephone Encounter (Signed)
Cialis sent over to pharmacy on 12/29/14 with 11 refills. Pt should not need any refills this soon

## 2015-04-20 ENCOUNTER — Ambulatory Visit: Payer: Commercial Managed Care - HMO | Admitting: Family Medicine

## 2015-04-23 ENCOUNTER — Ambulatory Visit: Payer: Commercial Managed Care - HMO | Admitting: Family Medicine

## 2015-04-24 ENCOUNTER — Ambulatory Visit (INDEPENDENT_AMBULATORY_CARE_PROVIDER_SITE_OTHER): Payer: Commercial Managed Care - HMO | Admitting: Family Medicine

## 2015-04-24 ENCOUNTER — Encounter: Payer: Self-pay | Admitting: Family Medicine

## 2015-04-24 VITALS — BP 130/82 | HR 110 | Temp 98.0°F | Resp 16 | Ht 71.0 in | Wt 202.4 lb

## 2015-04-24 DIAGNOSIS — G8929 Other chronic pain: Secondary | ICD-10-CM | POA: Diagnosis not present

## 2015-04-24 DIAGNOSIS — M545 Low back pain: Secondary | ICD-10-CM

## 2015-04-24 DIAGNOSIS — M1000 Idiopathic gout, unspecified site: Secondary | ICD-10-CM | POA: Diagnosis not present

## 2015-04-24 DIAGNOSIS — E039 Hypothyroidism, unspecified: Secondary | ICD-10-CM

## 2015-04-24 DIAGNOSIS — I10 Essential (primary) hypertension: Secondary | ICD-10-CM | POA: Diagnosis not present

## 2015-04-24 DIAGNOSIS — F1010 Alcohol abuse, uncomplicated: Secondary | ICD-10-CM | POA: Diagnosis not present

## 2015-04-24 DIAGNOSIS — E785 Hyperlipidemia, unspecified: Secondary | ICD-10-CM | POA: Insufficient documentation

## 2015-04-24 DIAGNOSIS — N529 Male erectile dysfunction, unspecified: Secondary | ICD-10-CM | POA: Diagnosis not present

## 2015-04-24 MED ORDER — ALLOPURINOL 300 MG PO TABS: 300.0000 mg | ORAL_TABLET | Freq: Every day | ORAL | Status: DC

## 2015-04-24 MED ORDER — ATORVASTATIN CALCIUM 40 MG PO TABS: 40.0000 mg | ORAL_TABLET | Freq: Every day | ORAL | Status: DC

## 2015-04-24 MED ORDER — LOSARTAN POTASSIUM-HCTZ 100-25 MG PO TABS: 1.0000 | ORAL_TABLET | Freq: Every day | ORAL | Status: DC

## 2015-04-24 NOTE — Progress Notes (Signed)
Name: Lonnie Huber   MRN: [id number]    DOB: 1946/01/28   Date:04/24/2015       Progress Note  Subjective  Chief Complaint  Chief Complaint  Patient presents with  . Hypertension    4 month follow up  . Hyperlipidemia  . Gout    HPI  Hypertension )  Patient presents for follow-up of hypertension. It has been present for over 5 years.  Patient states that there is compliance with medical regimen which consists of losartan HCT 100-25 . There is no end organ disease. Cardiac risk factors include hypertension hyperlipidemia and diabetes.  Exercise regimen consist of minimal .  Diet consist of some salt restriction .  Hyperlipidemia  Patient has a history of hyperlipidemia for over 5 years.  Current medical regimen consist of omega-3 fatty acids and atorvastatin 40 daily at bedtime .  Compliance is  good.  Diet and exercise are currently followed poorly .  Risk factors for cardiovascular disease include hyperlipidemia alcohol abuse and tobacco abuse in the past .   There have been no side effects from the medication.    Gout history of present illness  No recent flare of the gout. He usually has affected his large toes. Is currently on allopurinol 300 mg daily.  Patient has history of gout for over 5 years.  Chronic alcohol usage.  Patient states she still decreased his intake of alcohol. He however during celebrations and imbibes excessively having drunk several beers last p.m. while watching a football game and the birth of his latest grandchild. There's been no alcohol related problems with driving or domestic violence by his report.    Past Medical History  Diagnosis Date  . Thyroid disease   . Hypertension   . Hyperlipidemia   . Gout     Social History  Substance Use Topics  . Smoking status: Former Smoker    Quit date: 09/20/1978  . Smokeless tobacco: Not on file  . Alcohol Use: 0.0 oz/week    0 Standard drinks or equivalent per week     Current outpatient  prescriptions:  .  allopurinol (ZYLOPRIM) 300 MG tablet, Take 1 tablet (300 mg total) by mouth daily., Disp: 30 tablet, Rfl: 2 .  atorvastatin (LIPITOR) 40 MG tablet, Take 1 tablet (40 mg total) by mouth daily., Disp: 90 tablet, Rfl: 1 .  losartan-hydrochlorothiazide (HYZAAR) 100-25 MG tablet, Take 1 tablet by mouth daily., Disp: 90 tablet, Rfl: 1 .  Omega 3 1000 MG CAPS, Take 1 capsule by mouth 2 (two) times daily., Disp: , Rfl:  .  tadalafil (CIALIS) 20 MG tablet, Take 0.5-1 tablets (10-20 mg total) by mouth every other day as needed for erectile dysfunction., Disp: 6 tablet, Rfl: 11  Allergies  Allergen Reactions  . Fluzone [Flu Virus Vaccine]     Review of Systems  Constitutional: Negative for fever, chills and weight loss.  HENT: Negative for congestion, hearing loss, sore throat and tinnitus.   Eyes: Negative for blurred vision, double vision and redness.  Respiratory: Negative for cough, hemoptysis and shortness of breath.   Cardiovascular: Negative for chest pain, palpitations, orthopnea, claudication and leg swelling.  Gastrointestinal: Negative for heartburn, nausea, vomiting, diarrhea, constipation and blood in stool.  Genitourinary: Negative for dysuria, urgency, frequency and hematuria.  Musculoskeletal: Positive for joint pain. Negative for myalgias, back pain, falls and neck pain.  Skin: Negative for itching.  Neurological: Negative for dizziness, tingling, tremors, focal weakness, seizures, loss of consciousness, weakness and headaches.  Endo/Heme/Allergies: Does not bruise/bleed easily.  Psychiatric/Behavioral: Positive for substance abuse. Negative for depression. The patient is not nervous/anxious and does not have insomnia.      Objective  Filed Vitals:   04/24/15 1052  BP: 130/82  Pulse: 110  Temp: 98 F (36.7 C)  TempSrc: Oral  Resp: 16  Height: 5\' 11"  (1.803 m)  Weight: 202 lb 6.4 oz (91.808 kg)  SpO2: 98%     Physical Exam  Constitutional: He is  oriented to person, place, and time and well-developed, well-nourished, and in no distress.  HENT:  Head: Normocephalic.  Eyes: EOM are normal. Pupils are equal, round, and reactive to light.  Neck: Normal range of motion. Neck supple. No thyromegaly present.  Cardiovascular: Normal rate, regular rhythm and normal heart sounds.   No murmur heard. Pulmonary/Chest: Effort normal and breath sounds normal. No respiratory distress. He has no wheezes.  Abdominal: Soft. Bowel sounds are normal.  Musculoskeletal: He exhibits no edema.  Old surgical scar from knee replacement on the right and there is marked arthritic changes on the left.  Lymphadenopathy:    He has no cervical adenopathy.  Neurological: He is alert and oriented to person, place, and time. No cranial nerve deficit. Gait normal. Coordination normal.  Skin: Skin is warm and dry. No rash noted.  Psychiatric: Affect and judgment normal.      Assessment & Plan  1. Benign essential HTN Well-controlled - losartan-hydrochlorothiazide (HYZAAR) 100-25 MG tablet; Take 1 tablet by mouth daily.  Dispense: 90 tablet; Refill: 1 - EKG 12-Lead - Comprehensive Metabolic Panel (CMET) - TSH  2. Hypothyroidism, unspecified hypothyroidism type Labs today - TSH  3. Nondependent alcohol abuse, episodic drinking behavior Encourage continued attempted discontinuation  4. Idiopathic gout, unspecified chronicity, unspecified site Uric acid - allopurinol (ZYLOPRIM) 300 MG tablet; Take 1 tablet (300 mg total) by mouth daily.  Dispense: 30 tablet; Refill: 2  5. Alcohol abuse As above  6. Failure of erection Continue Cialis when necessary  7. Chronic LBP Symptomatically treatment  8. Hyperlipidemia Labs - atorvastatin (LIPITOR) 40 MG tablet; Take 1 tablet (40 mg total) by mouth daily.  Dispense: 90 tablet; Refill: 1 - Comprehensive Metabolic Panel (CMET) - Lipid panel

## 2015-04-25 ENCOUNTER — Other Ambulatory Visit: Payer: Self-pay | Admitting: Family Medicine

## 2015-04-25 ENCOUNTER — Telehealth: Payer: Self-pay | Admitting: Emergency Medicine

## 2015-04-25 LAB — LIPID PANEL
CHOLESTEROL TOTAL: 182 mg/dL (ref 100–199)
Chol/HDL Ratio: 3.1 ratio units (ref 0.0–5.0)
HDL: 58 mg/dL (ref >39)
LDL Calculated: 111 mg/dL — ABNORMAL HIGH (ref 0–99)
TRIGLYCERIDES: 65 mg/dL (ref 0–149)
VLDL Cholesterol Cal: 13 mg/dL (ref 5–40)

## 2015-04-25 LAB — COMPREHENSIVE METABOLIC PANEL
A/G RATIO: 1.3 (ref 1.1–2.5)
ALK PHOS: 66 IU/L (ref 39–117)
ALT: 58 IU/L — ABNORMAL HIGH (ref 0–44)
AST: 82 IU/L — AB (ref 0–40)
Albumin: 4 g/dL (ref 3.6–4.8)
BILIRUBIN TOTAL: 0.8 mg/dL (ref 0.0–1.2)
BUN/Creatinine Ratio: 7 — ABNORMAL LOW (ref 10–22)
BUN: 8 mg/dL (ref 8–27)
CO2: 22 mmol/L (ref 18–29)
Calcium: 9.4 mg/dL (ref 8.6–10.2)
Chloride: 89 mmol/L — ABNORMAL LOW (ref 96–106)
Creatinine, Ser: 1.18 mg/dL (ref 0.76–1.27)
GFR calc Af Amer: 73 mL/min/{1.73_m2} (ref >59)
GFR calc non Af Amer: 63 mL/min/{1.73_m2} (ref >59)
GLOBULIN, TOTAL: 3 g/dL (ref 1.5–4.5)
Glucose: 102 mg/dL — ABNORMAL HIGH (ref 65–99)
POTASSIUM: 3.9 mmol/L (ref 3.5–5.2)
SODIUM: 132 mmol/L — AB (ref 134–144)
Total Protein: 7 g/dL (ref 6.0–8.5)

## 2015-04-25 LAB — URIC ACID: Uric Acid: 3.1 mg/dL — ABNORMAL LOW (ref 3.7–8.6)

## 2015-04-25 LAB — TSH: TSH: 2.29 u[IU]/mL (ref 0.450–4.500)

## 2015-04-25 NOTE — Telephone Encounter (Signed)
Patient notified of labs.   

## 2015-04-29 ENCOUNTER — Other Ambulatory Visit: Payer: Self-pay | Admitting: Family Medicine

## 2015-06-04 DIAGNOSIS — H524 Presbyopia: Secondary | ICD-10-CM | POA: Diagnosis not present

## 2015-06-04 DIAGNOSIS — H5210 Myopia, unspecified eye: Secondary | ICD-10-CM | POA: Diagnosis not present

## 2015-06-04 DIAGNOSIS — H31002 Unspecified chorioretinal scars, left eye: Secondary | ICD-10-CM | POA: Diagnosis not present

## 2015-08-22 ENCOUNTER — Ambulatory Visit: Payer: Commercial Managed Care - HMO | Admitting: Family Medicine

## 2015-09-24 DIAGNOSIS — Z0100 Encounter for examination of eyes and vision without abnormal findings: Secondary | ICD-10-CM | POA: Diagnosis not present

## 2015-10-29 ENCOUNTER — Telehealth: Payer: Self-pay | Admitting: Family Medicine

## 2015-10-29 NOTE — Telephone Encounter (Signed)
Rx request received; denied all; appear to be generated from the pharmacy end; not things in his med list; chart reviewed Please ask patient to schedule an appt to see me in the next several weeks; he had several lab abnormalities that we need to follow-up on I look forward to meeting him

## 2015-10-30 NOTE — Telephone Encounter (Signed)
Left message with wife to return call.

## 2015-11-18 ENCOUNTER — Other Ambulatory Visit: Payer: Self-pay | Admitting: Family Medicine

## 2015-11-28 ENCOUNTER — Encounter: Payer: Self-pay | Admitting: Family Medicine

## 2015-11-28 ENCOUNTER — Ambulatory Visit (INDEPENDENT_AMBULATORY_CARE_PROVIDER_SITE_OTHER): Payer: Commercial Managed Care - HMO | Admitting: Family Medicine

## 2015-11-28 VITALS — BP 128/82 | HR 87 | Temp 98.7°F | Resp 16 | Ht 71.0 in | Wt 192.8 lb

## 2015-11-28 DIAGNOSIS — E785 Hyperlipidemia, unspecified: Secondary | ICD-10-CM | POA: Diagnosis not present

## 2015-11-28 DIAGNOSIS — R739 Hyperglycemia, unspecified: Secondary | ICD-10-CM | POA: Diagnosis not present

## 2015-11-28 DIAGNOSIS — I10 Essential (primary) hypertension: Secondary | ICD-10-CM | POA: Diagnosis not present

## 2015-11-28 DIAGNOSIS — M10071 Idiopathic gout, right ankle and foot: Secondary | ICD-10-CM | POA: Diagnosis not present

## 2015-11-28 LAB — POCT GLYCOSYLATED HEMOGLOBIN (HGB A1C): HEMOGLOBIN A1C: 5.7

## 2015-11-28 MED ORDER — LOSARTAN POTASSIUM-HCTZ 100-25 MG PO TABS: 1.0000 | ORAL_TABLET | Freq: Every day | ORAL | 1 refills | Status: DC

## 2015-11-28 MED ORDER — ALLOPURINOL 300 MG PO TABS: 300.0000 mg | ORAL_TABLET | Freq: Every day | ORAL | 1 refills | Status: DC

## 2015-11-28 MED ORDER — ATORVASTATIN CALCIUM 40 MG PO TABS: 40.0000 mg | ORAL_TABLET | Freq: Every day | ORAL | 1 refills | Status: DC

## 2015-11-28 NOTE — Progress Notes (Signed)
Name: Lonnie Huber   MRN: [id number]    DOB: 03/09/1946   Date:11/28/2015       Progress Note  Subjective  Chief Complaint  Chief Complaint  Patient presents with  . Hyperlipidemia    follow up, medication refills  . Hypertension  . Gout  This patient is usually followed by Dr. Thana AtesMorrisey, new to me  Hyperlipidemia  This is a chronic problem. The problem is controlled. Recent lipid tests were reviewed and are high. Pertinent negatives include no chest pain, leg pain, myalgias or shortness of breath. Current antihyperlipidemic treatment includes statins.  Hypertension  This is a chronic problem. The problem is unchanged. The problem is controlled. Pertinent negatives include no blurred vision, chest pain, headaches, palpitations or shortness of breath. Past treatments include angiotensin blockers and diuretics. There is no history of kidney disease, CAD/MI or CVA.   Gout: Last gout attack was over 2 years ago, affected the right toe. He has since been on Allopurinol to prevent another gout attack.   Past Medical History:  Diagnosis Date  . Gout   . Hyperlipidemia   . Hypertension   . Thyroid disease     Past Surgical History:  Procedure Laterality Date  . LUMBAR FUSION  10/12/2009  . LUMBAR LAMINECTOMY  10/12/2009  . REPLACEMENT TOTAL KNEE Right 08/2013    Family History  Problem Relation Age of Onset  . Coronary artery disease Mother   . Congestive Heart Failure Father     Social History   Social History  . Marital status: Married    Spouse name: N/A  . Number of children: N/A  . Years of education: N/A   Occupational History  . Not on file.   Social History Main Topics  . Smoking status: Former Smoker    Quit date: 09/20/1978  . Smokeless tobacco: Never Used  . Alcohol use 0.0 oz/week  . Drug use: No  . Sexual activity: Not on file   Other Topics Concern  . Not on file   Social History Narrative  . No narrative on file     Current Outpatient  Prescriptions:  .  allopurinol (ZYLOPRIM) 300 MG tablet, Take 1 tablet (300 mg total) by mouth daily., Disp: 30 tablet, Rfl: 2 .  atorvastatin (LIPITOR) 40 MG tablet, Take 1 tablet (40 mg total) by mouth daily., Disp: 90 tablet, Rfl: 1 .  losartan-hydrochlorothiazide (HYZAAR) 100-25 MG tablet, TAKE 1 TABLET BY MOUTH ONCE A DAY, Disp: 90 tablet, Rfl: 1 .  Omega 3 1000 MG CAPS, Take 1 capsule by mouth 2 (two) times daily., Disp: , Rfl:  .  tadalafil (CIALIS) 20 MG tablet, Take 0.5-1 tablets (10-20 mg total) by mouth every other day as needed for erectile dysfunction., Disp: 6 tablet, Rfl: 11  Allergies  Allergen Reactions  . Fluzone [Flu Virus Vaccine]      Review of Systems  Eyes: Negative for blurred vision.  Respiratory: Negative for shortness of breath.   Cardiovascular: Negative for chest pain and palpitations.  Musculoskeletal: Negative for myalgias.  Neurological: Negative for headaches.    Objective  Vitals:   11/28/15 0942  BP: 128/82  Pulse: 87  Resp: 16  Temp: 98.7 F (37.1 C)  TempSrc: Oral  SpO2: 97%  Weight: 192 lb 12.8 oz (87.5 kg)  Height: 5\' 11"  (1.803 m)    Physical Exam  Constitutional: He is oriented to person, place, and time and well-developed, well-nourished, and in no distress.  HENT:  Head: Normocephalic  and atraumatic.  Cardiovascular: Normal rate, regular rhythm, S1 normal, S2 normal and normal heart sounds.   No murmur heard. Pulmonary/Chest: Effort normal and breath sounds normal. He has no wheezes.  Abdominal: Soft. Bowel sounds are normal. There is no tenderness.  Musculoskeletal: He exhibits no edema.  Neurological: He is alert and oriented to person, place, and time.  Psychiatric: Mood, memory, affect and judgment normal.     Assessment & Plan  1. Benign essential HTN BP at goal, continue on antihypertensive therapy - losartan-hydrochlorothiazide (HYZAAR) 100-25 MG tablet; Take 1 tablet by mouth daily.  Dispense: 90 tablet; Refill:  1  2. Idiopathic gout of right foot, unspecified chronicity Obtain uric acid levels, if elevated, consider discontinuing diuretic therapy. - Uric acid - allopurinol (ZYLOPRIM) 300 MG tablet; Take 1 tablet (300 mg total) by mouth daily.  Dispense: 90 tablet; Refill: 1  3. Hyperlipidemia Elevated LDL cholesterol, continue on statin, recheck - Lipid Profile - COMPLETE METABOLIC PANEL WITH GFR - atorvastatin (LIPITOR) 40 MG tablet; Take 1 tablet (40 mg total) by mouth daily.  Dispense: 90 tablet; Refill: 1  4. Hyperglycemia  - POCT HgB A1C   Lonnie Huber Asad A. Faylene KurtzShah [facility] 11/28/2015 9:50 AM

## 2016-02-29 ENCOUNTER — Ambulatory Visit: Payer: Commercial Managed Care - HMO | Admitting: Family Medicine

## 2016-03-25 ENCOUNTER — Ambulatory Visit: Payer: Commercial Managed Care - HMO | Admitting: Family Medicine

## 2016-03-28 ENCOUNTER — Telehealth: Payer: Self-pay | Admitting: Family Medicine

## 2016-03-28 NOTE — Telephone Encounter (Signed)
Patient wanted to inform you that he has switched to [facility] and that they will be faxing refill requests for all his medications. [facility] that [facility]

## 2016-04-09 NOTE — Telephone Encounter (Signed)
Okay thank you, will refill medications when the request is received

## 2016-04-16 ENCOUNTER — Encounter: Payer: Self-pay | Admitting: Family Medicine

## 2016-04-16 ENCOUNTER — Ambulatory Visit (INDEPENDENT_AMBULATORY_CARE_PROVIDER_SITE_OTHER): Payer: Commercial Managed Care - HMO | Admitting: Family Medicine

## 2016-04-16 DIAGNOSIS — E785 Hyperlipidemia, unspecified: Secondary | ICD-10-CM | POA: Diagnosis not present

## 2016-04-16 DIAGNOSIS — M10071 Idiopathic gout, right ankle and foot: Secondary | ICD-10-CM

## 2016-04-16 DIAGNOSIS — I10 Essential (primary) hypertension: Secondary | ICD-10-CM

## 2016-04-16 MED ORDER — ALLOPURINOL 300 MG PO TABS: 300.0000 mg | ORAL_TABLET | Freq: Every day | ORAL | 1 refills | Status: DC

## 2016-04-16 MED ORDER — LOSARTAN POTASSIUM-HCTZ 100-25 MG PO TABS: 1.0000 | ORAL_TABLET | Freq: Every day | ORAL | 1 refills | Status: DC

## 2016-04-16 MED ORDER — ATORVASTATIN CALCIUM 40 MG PO TABS: 40.0000 mg | ORAL_TABLET | Freq: Every day | ORAL | 1 refills | Status: DC

## 2016-04-16 NOTE — Progress Notes (Signed)
Name: Lonnie Huber   MRN: [id number]    DOB: [birth year]   Date:04/16/2016       Progress Note  Subjective  Chief Complaint  Chief Complaint  Patient presents with  . Hypertension    3 month follow up  . Hyperlipidemia  . Gout    Hypertension  This is a chronic problem. The problem is unchanged. The problem is controlled. Pertinent negatives include no blurred vision, chest pain, headaches, palpitations or shortness of breath. Past treatments include angiotensin blockers and diuretics. There is no history of kidney disease, CAD/MI or CVA.  Hyperlipidemia  This is a chronic problem. The problem is controlled. Recent lipid tests were reviewed and are high. Pertinent negatives include no chest pain, leg pain, myalgias or shortness of breath. Current antihyperlipidemic treatment includes statins.    Past Medical History:  Diagnosis Date  . Gout   . Hyperlipidemia   . Hypertension   . Thyroid disease     Past Surgical History:  Procedure Laterality Date  . LUMBAR FUSION  10/12/2009  . LUMBAR LAMINECTOMY  10/12/2009  . REPLACEMENT TOTAL KNEE Right 08/2013    Family History  Problem Relation Age of Onset  . Coronary artery disease Mother   . Congestive Heart Failure Father     Social History   Social History  . Marital status: Married    Spouse name: N/A  . Number of children: N/A  . Years of education: N/A   Occupational History  . Not on file.   Social History Main Topics  . Smoking status: Former Smoker    Quit date: 09/20/1978  . Smokeless tobacco: Never Used  . Alcohol use 0.0 oz/week  . Drug use: No  . Sexual activity: Not on file   Other Topics Concern  . Not on file   Social History Narrative  . No narrative on file     Current Outpatient Prescriptions:  .  allopurinol (ZYLOPRIM) 300 MG tablet, Take 1 tablet (300 mg total) by mouth daily., Disp: 90 tablet, Rfl: 1 .  atorvastatin (LIPITOR) 40 MG tablet, Take 1 tablet (40 mg total) by mouth daily.,  Disp: 90 tablet, Rfl: 1 .  losartan-hydrochlorothiazide (HYZAAR) 100-25 MG tablet, Take 1 tablet by mouth daily., Disp: 90 tablet, Rfl: 1 .  Omega 3 1000 MG CAPS, Take 1 capsule by mouth 2 (two) times daily., Disp: , Rfl:  .  tadalafil (CIALIS) 20 MG tablet, Take 0.5-1 tablets (10-20 mg total) by mouth every other day as needed for erectile dysfunction., Disp: 6 tablet, Rfl: 11  Allergies  Allergen Reactions  . Fluzone [Flu Virus Vaccine]      Review of Systems  Eyes: Negative for blurred vision.  Respiratory: Negative for shortness of breath.   Cardiovascular: Negative for chest pain and palpitations.  Musculoskeletal: Negative for myalgias.  Neurological: Negative for headaches.      Objective  Vitals:   04/16/16 1012  BP: 126/82  Pulse: (!) 104  Resp: 18  Temp: 97.8 F (36.6 C)  TempSrc: Oral  SpO2: 99%  Weight: 201 lb 6.4 oz (91.4 kg)  Height: 5\' 11"  (1.803 m)    Physical Exam  Constitutional: He is oriented to person, place, and time and well-developed, well-nourished, and in no distress.  HENT:  Head: Normocephalic and atraumatic.  Cardiovascular: Normal rate, regular rhythm, S1 normal, S2 normal and normal heart sounds.   No murmur heard. Pulmonary/Chest: Effort normal and breath sounds normal. He has no wheezes.  Abdominal:  Soft. Bowel sounds are normal. There is no tenderness.  Musculoskeletal: He exhibits no edema.  Neurological: He is alert and oriented to person, place, and time.  Psychiatric: Mood, memory, affect and judgment normal.      Assessment & Plan  1. Benign essential HTN  - losartan-hydrochlorothiazide (HYZAAR) 100-25 MG tablet; Take 1 tablet by mouth daily.  Dispense: 90 tablet; Refill: 1  2. Hyperlipidemia, unspecified hyperlipidemia type  - atorvastatin (LIPITOR) 40 MG tablet; Take 1 tablet (40 mg total) by mouth daily.  Dispense: 90 tablet; Refill: 1 - Lipid Profile - COMPLETE METABOLIC PANEL WITH GFR  3. Idiopathic gout of  right foot, unspecified chronicity  - allopurinol (ZYLOPRIM) 300 MG tablet; Take 1 tablet (300 mg total) by mouth daily.  Dispense: 90 tablet; Refill: 1 - Uric acid   Neyland Pettengill Asad A. Faylene KurtzShah [facility] 04/16/2016 10:43 AM

## 2016-05-12 ENCOUNTER — Other Ambulatory Visit: Payer: Self-pay | Admitting: Family Medicine

## 2016-05-12 DIAGNOSIS — E785 Hyperlipidemia, unspecified: Secondary | ICD-10-CM | POA: Diagnosis not present

## 2016-05-12 DIAGNOSIS — M10071 Idiopathic gout, right ankle and foot: Secondary | ICD-10-CM | POA: Diagnosis not present

## 2016-05-12 LAB — LIPID PANEL
Cholesterol: 110 mg/dL (ref <200)
HDL: 34 mg/dL — ABNORMAL LOW (ref >40)
LDL CALC: 65 mg/dL (ref <100)
TRIGLYCERIDES: 57 mg/dL (ref <150)
Total CHOL/HDL Ratio: 3.2 Ratio (ref <5.0)
VLDL: 11 mg/dL (ref <30)

## 2016-05-12 LAB — COMPLETE METABOLIC PANEL WITH GFR
ALT: 16 U/L (ref 9–46)
AST: 28 U/L (ref 10–35)
Albumin: 3.8 g/dL (ref 3.6–5.1)
Alkaline Phosphatase: 48 U/L (ref 40–115)
BUN: 8 mg/dL (ref 7–25)
CHLORIDE: 97 mmol/L — AB (ref 98–110)
CO2: 30 mmol/L (ref 20–31)
CREATININE: 1.24 mg/dL (ref 0.70–1.25)
Calcium: 9.8 mg/dL (ref 8.6–10.3)
GFR, Est African American: 69 mL/min (ref >=60)
GFR, Est Non African American: 59 mL/min — ABNORMAL LOW (ref >=60)
GLUCOSE: 101 mg/dL — AB (ref 65–99)
Potassium: 3.6 mmol/L (ref 3.5–5.3)
SODIUM: 134 mmol/L — AB (ref 135–146)
Total Bilirubin: 1.1 mg/dL (ref 0.2–1.2)
Total Protein: 7 g/dL (ref 6.1–8.1)

## 2016-05-13 LAB — URIC ACID: URIC ACID, SERUM: 4.4 mg/dL (ref 4.0–8.0)

## 2016-05-19 ENCOUNTER — Ambulatory Visit (INDEPENDENT_AMBULATORY_CARE_PROVIDER_SITE_OTHER): Payer: Commercial Managed Care - HMO

## 2016-05-19 VITALS — BP 122/56 | HR 96 | Temp 97.0°F | Ht 71.0 in | Wt 207.2 lb

## 2016-05-19 DIAGNOSIS — Z0000 Encounter for general adult medical examination without abnormal findings: Secondary | ICD-10-CM

## 2016-05-19 DIAGNOSIS — Z1159 Encounter for screening for other viral diseases: Secondary | ICD-10-CM | POA: Diagnosis not present

## 2016-05-19 DIAGNOSIS — Z23 Encounter for immunization: Secondary | ICD-10-CM

## 2016-05-19 NOTE — Progress Notes (Signed)
Subjective:   Lonnie Huber is a 71 y.o. male who presents for Medicare Annual/Subsequent preventive examination.  Review of Systems:  N/A  Cardiac Risk Factors include: advanced age (>79men, >24 women);dyslipidemia;hypertension;male gender     Objective:    Vitals: BP (!) 122/56 (BP Location: Left Arm)   Pulse 96   Temp 97 F (36.1 C) (Oral)   Ht 5\' 11"  (1.803 m)   Wt 207 lb 3.2 oz (94 kg)   BMI 28.90 kg/m   Body mass index is 28.9 kg/m.  Tobacco History  Smoking Status  . Former Smoker  . Types: Cigarettes  . Quit date: 09/20/1978  Smokeless Tobacco  . Never Used     Counseling given: Not Answered   Past Medical History:  Diagnosis Date  . Gout   . Hyperlipidemia   . Hypertension   . Thyroid disease    Past Surgical History:  Procedure Laterality Date  . LUMBAR FUSION  10/12/2009  . LUMBAR LAMINECTOMY  10/12/2009  . REPLACEMENT TOTAL KNEE Right 08/2013   Family History  Problem Relation Age of Onset  . Coronary artery disease Mother   . Congestive Heart Failure Father    History  Sexual Activity  . Sexual activity: Not on file    Outpatient Encounter Prescriptions as of 05/19/2016  Medication Sig  . allopurinol (ZYLOPRIM) 300 MG tablet Take 1 tablet (300 mg total) by mouth daily.  Marland Kitchen atorvastatin (LIPITOR) 40 MG tablet Take 1 tablet (40 mg total) by mouth daily.  Marland Kitchen losartan-hydrochlorothiazide (HYZAAR) 100-25 MG tablet Take 1 tablet by mouth daily.  . Omega 3 1000 MG CAPS Take 1 capsule by mouth 2 (two) times daily.  . tadalafil (CIALIS) 20 MG tablet Take 0.5-1 tablets (10-20 mg total) by mouth every other day as needed for erectile dysfunction.   No facility-administered encounter medications on file as of 05/19/2016.     Activities of Daily Living In your present state of health, do you have any difficulty performing the following activities: 05/19/2016  Hearing? N  Vision? N  Difficulty concentrating or making decisions? N  Walking or  climbing stairs? Y  Dressing or bathing? N  Doing errands, shopping? N  Preparing Food and eating ? N  Using the Toilet? N  In the past six months, have you accidently leaked urine? Y  Do you have problems with loss of bowel control? N  Managing your Medications? N  Managing your Finances? N  Housekeeping or managing your Housekeeping? N  Some recent data might be hidden    Patient Care Team: Ellyn Hack, MD as PCP - General (Family Medicine)   Assessment:     Exercise Activities and Dietary recommendations Current Exercise Habits: Structured exercise class, Type of exercise: walking;strength training/weights;stretching, Time (Minutes): 60, Frequency (Times/Week): 7, Weekly Exercise (Minutes/Week): 420, Intensity: Mild, Exercise limited by: None identified  Goals    . Increase water intake          Starting 05/19/16, I will increase my water intake to 3-4 glasses a day.      Fall Risk Fall Risk  05/19/2016 04/24/2015 12/21/2014 12/21/2014 09/20/2014  Falls in the past year? No No No No No   Depression Screen PHQ 2/9 Scores 05/19/2016 04/24/2015  PHQ - 2 Score 0 0    Cognitive Function     6CIT Screen 05/19/2016  What Year? 0 points  What month? 0 points  What time? 0 points  Count back from 20 0  points  Months in reverse 4 points  Repeat phrase 8 points  Total Score 12    Immunization History  Administered Date(s) Administered  . Pneumococcal Conjugate-13 05/19/2016  . Pneumococcal Polysaccharide-23 12/21/2014  . Tdap 04/26/2012  . Zoster 04/26/2012   Screening Tests Health Maintenance  Topic Date Due  . COLONOSCOPY  11/12/2020  . TETANUS/TDAP  04/26/2022  . INFLUENZA VACCINE  Completed  . ZOSTAVAX  Completed  . Hepatitis C Screening  Completed  . PNA vac Low Risk Adult  Completed      Plan:  I have personally reviewed and addressed the Medicare Annual Wellness questionnaire and have noted the following in the patient's chart:  A. Medical and social  history B. Use of alcohol, tobacco or illicit drugs  C. Current medications and supplements D. Functional ability and status E.  Nutritional status F.  Physical activity G. Advance directives H. List of other physicians I.  Hospitalizations, surgeries, and ER visits in previous 12 months J.  Vitals K. Screenings such as hearing and vision if needed, cognitive and depression L. Referrals and appointments - none  In addition, I have reviewed and discussed with patient certain preventive protocols, quality metrics, and best practice recommendations. A written personalized care plan for preventive services as well as general preventive health recommendations were provided to patient.  See attached scanned questionnaire for additional information.   Signed,  Hyacinth MeekerMckenzie Soua Lenk, LPN Nurse Health Advisor   MD Recommendations: None. I, as supervising physician, have reviewed the nurse health advisor's Medicare Wellness Visit note for this patient and concur with the findings and recommendations listed above.  Signed Syed Asad A. Sherryll BurgerShah MD Attending Physician.

## 2016-05-19 NOTE — Patient Instructions (Signed)

## 2016-06-04 ENCOUNTER — Ambulatory Visit (INDEPENDENT_AMBULATORY_CARE_PROVIDER_SITE_OTHER): Payer: Medicare HMO | Admitting: Family Medicine

## 2016-06-04 ENCOUNTER — Encounter: Payer: Self-pay | Admitting: Family Medicine

## 2016-06-04 VITALS — BP 120/70 | HR 100 | Temp 97.9°F | Resp 16 | Ht 71.0 in | Wt 203.4 lb

## 2016-06-04 DIAGNOSIS — M25561 Pain in right knee: Secondary | ICD-10-CM | POA: Diagnosis not present

## 2016-06-04 DIAGNOSIS — G8929 Other chronic pain: Secondary | ICD-10-CM | POA: Diagnosis not present

## 2016-06-04 DIAGNOSIS — Z0000 Encounter for general adult medical examination without abnormal findings: Secondary | ICD-10-CM

## 2016-06-04 NOTE — Progress Notes (Signed)
Name: Lonnie Huber   MRN: [id number]    DOB: 02/23/1946   Date:06/04/2016       Progress Note  Subjective  Chief Complaint  Chief Complaint  Patient presents with  . Annual Exam    HPI  Pt. Presents for [employer] [employer] Wellness visit part 2.  He has concerns about chronic intermittent right knee pain, has history of right total knee replacement but reports that occasionally it hurts and swells up. Has not taken any medication for the knee pain  Past Medical History:  Diagnosis Date  . Gout   . Hyperlipidemia   . Hypertension   . Thyroid disease     Past Surgical History:  Procedure Laterality Date  . LUMBAR FUSION  10/12/2009  . LUMBAR LAMINECTOMY  10/12/2009  . REPLACEMENT TOTAL KNEE Right 08/2013    Family History  Problem Relation Age of Onset  . Coronary artery disease Mother   . Congestive Heart Failure Father     Social History   Social History  . Marital status: Married    Spouse name: N/A  . Number of children: N/A  . Years of education: N/A   Occupational History  . Not on file.   Social History Main Topics  . Smoking status: Former Smoker    Types: Cigarettes    Quit date: 09/20/1978  . Smokeless tobacco: Never Used  . Alcohol use 0.0 - 12.6 oz/week  . Drug use: No  . Sexual activity: Not on file   Other Topics Concern  . Not on file   Social History Narrative  . No narrative on file     Current Outpatient Prescriptions:  .  allopurinol (ZYLOPRIM) 300 MG tablet, Take 1 tablet (300 mg total) by mouth daily., Disp: 90 tablet, Rfl: 1 .  atorvastatin (LIPITOR) 40 MG tablet, Take 1 tablet (40 mg total) by mouth daily., Disp: 90 tablet, Rfl: 1 .  losartan-hydrochlorothiazide (HYZAAR) 100-25 MG tablet, Take 1 tablet by mouth daily., Disp: 90 tablet, Rfl: 1 .  Omega 3 1000 MG CAPS, Take 1 capsule by mouth 2 (two) times daily., Disp: , Rfl:  .  tadalafil (CIALIS) 20 MG tablet, Take 0.5-1 tablets (10-20 mg total) by mouth every other day as needed for  erectile dysfunction., Disp: 6 tablet, Rfl: 11  Allergies  Allergen Reactions  . Fluzone [Flu Virus Vaccine]      Review of Systems  Constitutional: Negative for chills, fever and malaise/fatigue.  HENT: Negative for congestion, ear pain and sore throat.   Eyes: Negative for blurred vision and double vision.  Respiratory: Negative for cough and shortness of breath.   Cardiovascular: Positive for leg swelling (right knee pain and occasional swelling). Negative for chest pain.  Gastrointestinal: Negative for abdominal pain, blood in stool, nausea and vomiting.  Genitourinary: Negative for dysuria and hematuria.  Musculoskeletal: Positive for joint pain. Negative for back pain (had back surgery 7 years ago) and neck pain.  Skin: Negative for rash.  Neurological: Negative for dizziness and headaches.  Psychiatric/Behavioral: Negative for depression. The patient is not nervous/anxious and does not have insomnia.     Objective  Vitals:   06/04/16 1033  BP: 120/70  Pulse: 100  Resp: 16  Temp: 97.9 F (36.6 C)  TempSrc: Oral  SpO2: 97%  Weight: 203 lb 6.4 oz (92.3 kg)  Height: 5\' 11"  (1.803 m)    Physical Exam  Constitutional: He is oriented to person, place, and time and well-developed, well-nourished, and in no distress.  HENT:  Head: Normocephalic and atraumatic.  Right Ear: External ear normal.  Left Ear: External ear normal.  Eyes: Pupils are equal, round, and reactive to light.  Cardiovascular: Normal rate and regular rhythm.   No murmur heard. Pulmonary/Chest: Effort normal and breath sounds normal. He has no wheezes.  Abdominal: Soft. Bowel sounds are normal. There is no tenderness.  Genitourinary: Rectum normal and prostate normal. Rectal exam shows no tenderness. Prostate is not tender.  Musculoskeletal: Normal range of motion. He exhibits no edema.       Right knee: He exhibits no swelling. No tenderness found.       Legs: Surgical scar on right knee from TKR    Neurological: He is alert and oriented to person, place, and time.  Psychiatric: Mood, memory, affect and judgment normal.  Nursing note and vitals reviewed.      Assessment & Plan  1. Medicare annual wellness visit, subsequent Obtain PSA for screening. Other lab work already completed - PSA  2. Chronic pain of right knee Unclear etiology, recommended that he follow up with orthopedics, he may need an x-ray and anti-inflammatory medication for pain relief.   Lonnie Huber Asad A. [facility] 06/04/2016 10:45 AM

## 2016-08-01 ENCOUNTER — Ambulatory Visit: Payer: Medicare HMO | Admitting: Family Medicine

## 2016-08-19 ENCOUNTER — Ambulatory Visit (INDEPENDENT_AMBULATORY_CARE_PROVIDER_SITE_OTHER): Payer: Medicare HMO | Admitting: Family Medicine

## 2016-08-19 ENCOUNTER — Encounter: Payer: Self-pay | Admitting: Family Medicine

## 2016-08-19 VITALS — BP 120/73 | HR 91 | Temp 97.6°F | Resp 16 | Ht 71.0 in | Wt 199.6 lb

## 2016-08-19 DIAGNOSIS — E7800 Pure hypercholesterolemia, unspecified: Secondary | ICD-10-CM

## 2016-08-19 DIAGNOSIS — Z1211 Encounter for screening for malignant neoplasm of colon: Secondary | ICD-10-CM

## 2016-08-19 DIAGNOSIS — I10 Essential (primary) hypertension: Secondary | ICD-10-CM

## 2016-08-19 LAB — LIPID PANEL
CHOL/HDL RATIO: 1.8 ratio (ref <5.0)
CHOLESTEROL: 102 mg/dL (ref <200)
HDL: 57 mg/dL (ref >40)
LDL Cholesterol: 36 mg/dL (ref <100)
TRIGLYCERIDES: 47 mg/dL (ref <150)
VLDL: 9 mg/dL (ref <30)

## 2016-08-19 MED ORDER — ATORVASTATIN CALCIUM 40 MG PO TABS: 40.0000 mg | ORAL_TABLET | Freq: Every day | ORAL | 1 refills | Status: DC

## 2016-08-19 MED ORDER — LOSARTAN POTASSIUM-HCTZ 100-25 MG PO TABS: 1.0000 | ORAL_TABLET | Freq: Every day | ORAL | 1 refills | Status: DC

## 2016-08-19 NOTE — Progress Notes (Signed)
Name: Lonnie Huber   MRN: [id number]    DOB: [birth year]   Date:08/19/2016       Progress Note  Subjective  Chief Complaint  Chief Complaint  Patient presents with  . Follow-up    Hypertension  This is a chronic problem. The problem is unchanged. The problem is controlled. Pertinent negatives include no blurred vision, chest pain, headaches, palpitations or shortness of breath. Past treatments include angiotensin blockers and diuretics. There is no history of kidney disease, CAD/MI or CVA.  Hyperlipidemia  This is a chronic problem. The problem is controlled. Recent lipid tests were reviewed and are high. Pertinent negatives include no chest pain, leg pain, myalgias or shortness of breath. Current antihyperlipidemic treatment includes statins.  Patient's last colonoscopy was in March 2010, no records available, will refer to get repeat colonoscopy. No FHx of colorectal cancer.    Past Medical History:  Diagnosis Date  . Gout   . Hyperlipidemia   . Hypertension   . Thyroid disease     Past Surgical History:  Procedure Laterality Date  . LUMBAR FUSION  10/12/2009  . LUMBAR LAMINECTOMY  10/12/2009  . REPLACEMENT TOTAL KNEE Right 08/2013    Family History  Problem Relation Age of Onset  . Coronary artery disease Mother   . Congestive Heart Failure Father     Social History   Social History  . Marital status: Married    Spouse name: N/A  . Number of children: N/A  . Years of education: N/A   Occupational History  . Not on file.   Social History Main Topics  . Smoking status: Former Smoker    Types: Cigarettes    Quit date: 09/20/1978  . Smokeless tobacco: Never Used  . Alcohol use 0.0 - 12.6 oz/week  . Drug use: No  . Sexual activity: Not on file   Other Topics Concern  . Not on file   Social History Narrative  . No narrative on file     Current Outpatient Prescriptions:  .  allopurinol (ZYLOPRIM) 300 MG tablet, Take 1 tablet (300 mg total) by mouth  daily., Disp: 90 tablet, Rfl: 1 .  atorvastatin (LIPITOR) 40 MG tablet, Take 1 tablet (40 mg total) by mouth daily., Disp: 90 tablet, Rfl: 1 .  losartan-hydrochlorothiazide (HYZAAR) 100-25 MG tablet, Take 1 tablet by mouth daily., Disp: 90 tablet, Rfl: 1 .  Omega 3 1000 MG CAPS, Take 1 capsule by mouth 2 (two) times daily., Disp: , Rfl:  .  tadalafil (CIALIS) 20 MG tablet, Take 0.5-1 tablets (10-20 mg total) by mouth every other day as needed for erectile dysfunction., Disp: 6 tablet, Rfl: 11  Allergies  Allergen Reactions  . Fluzone [Flu Virus Vaccine]      Review of Systems  Eyes: Negative for blurred vision.  Respiratory: Negative for shortness of breath.   Cardiovascular: Negative for chest pain and palpitations.  Musculoskeletal: Negative for myalgias.  Neurological: Negative for headaches.    Objective  Vitals:   08/19/16 0840  BP: 120/73  Pulse: 91  Resp: 16  Temp: 97.6 F (36.4 C)  TempSrc: Oral  SpO2: 98%  Weight: 199 lb 9.6 oz (90.5 kg)  Height: 5\' 11"  (1.803 m)    Physical Exam  Constitutional: He is oriented to person, place, and time and well-developed, well-nourished, and in no distress.  HENT:  Head: Normocephalic and atraumatic.  Cardiovascular: Normal rate, regular rhythm, S1 normal, S2 normal and normal heart sounds.   No murmur heard.  Pulmonary/Chest: Effort normal and breath sounds normal. He has no wheezes.  Abdominal: Soft. Bowel sounds are normal. There is no tenderness.  Musculoskeletal: He exhibits no edema.  Neurological: He is alert and oriented to person, place, and time.  Psychiatric: Mood, memory, affect and judgment normal.        Assessment & Plan  1. Benign essential HTN BP stable on present antihypertensive therapy - losartan-hydrochlorothiazide (HYZAAR) 100-25 MG tablet; Take 1 tablet by mouth daily.  Dispense: 90 tablet; Refill: 1  2. Pure hypercholesterolemia  - Lipid panel - atorvastatin (LIPITOR) 40 MG tablet; Take 1  tablet (40 mg total) by mouth daily.  Dispense: 90 tablet; Refill: 1  3. Screening for colon cancer  - Ambulatory referral to Gastroenterology   Kathi LudwigSyed Asad A. Faylene KurtzShah [facility] 08/19/2016 8:46 AM

## 2016-09-04 ENCOUNTER — Other Ambulatory Visit: Payer: Self-pay

## 2016-09-04 ENCOUNTER — Telehealth: Payer: Self-pay

## 2016-09-04 DIAGNOSIS — Z1211 Encounter for screening for malignant neoplasm of colon: Secondary | ICD-10-CM

## 2016-09-04 NOTE — Telephone Encounter (Signed)
Gastroenterology Pre-Procedure Review  Request Date: 10/10/16 Requesting Physician: Dr. Tobi BastosAnna  PATIENT REVIEW QUESTIONS: The patient responded to the following health history questions as indicated:    1. Are you having any GI issues? no 2. Do you have a personal history of Polyps? yes (self) 3. Do you have a family history of Colon Cancer or Polyps? self polyps 4. Diabetes Mellitus? no 5. Joint replacements in the past 12 months?no 6. Major health problems in the past 3 months?no 7. Any artificial heart valves, MVP, or defibrillator?no    MEDICATIONS & ALLERGIES:    Patient reports the following regarding taking any anticoagulation/antiplatelet therapy:   Plavix, Coumadin, Eliquis, Xarelto, Lovenox, Pradaxa, Brilinta, or Effient? no Aspirin? no  Patient confirms/reports the following medications:  Current Outpatient Prescriptions  Medication Sig Dispense Refill  . allopurinol (ZYLOPRIM) 300 MG tablet Take 1 tablet (300 mg total) by mouth daily. 90 tablet 1  . atorvastatin (LIPITOR) 40 MG tablet Take 1 tablet (40 mg total) by mouth daily. 90 tablet 1  . losartan-hydrochlorothiazide (HYZAAR) 100-25 MG tablet Take 1 tablet by mouth daily. 90 tablet 1  . Omega 3 1000 MG CAPS Take 1 capsule by mouth 2 (two) times daily.    . tadalafil (CIALIS) 20 MG tablet Take 0.5-1 tablets (10-20 mg total) by mouth every other day as needed for erectile dysfunction. 6 tablet 11   No current facility-administered medications for this visit.     Patient confirms/reports the following allergies:  Allergies  Allergen Reactions  . Fluzone [Flu Virus Vaccine]     No orders of the defined types were placed in this encounter.   AUTHORIZATION INFORMATION Primary Insurance: 1D#: Group #:  Secondary Insurance: 1D#: Group #:  SCHEDULE INFORMATION: Date:  10/10/16 Time: Location:[facility] Dr. Tobi BastosAnna

## 2016-10-10 ENCOUNTER — Ambulatory Visit
Admission: RE | Admit: 2016-10-10 | Discharge: 2016-10-10 | Disposition: A | Discharge status: Home / Self Care | Payer: Medicare HMO | Source: Ambulatory Visit | Attending: Gastroenterology | Admitting: Gastroenterology

## 2016-10-10 ENCOUNTER — Ambulatory Visit: Payer: Medicare HMO | Admitting: Anesthesiology

## 2016-10-10 ENCOUNTER — Encounter: Payer: Self-pay | Admitting: Anesthesiology

## 2016-10-10 ENCOUNTER — Encounter
Admission: RE | Disposition: A | Discharge status: Home / Self Care | Payer: Self-pay | Source: Ambulatory Visit | Attending: Gastroenterology

## 2016-10-10 DIAGNOSIS — K5790 Diverticulosis of intestine, part unspecified, without perforation or abscess without bleeding: Secondary | ICD-10-CM | POA: Diagnosis not present

## 2016-10-10 DIAGNOSIS — Z87891 Personal history of nicotine dependence: Secondary | ICD-10-CM | POA: Insufficient documentation

## 2016-10-10 DIAGNOSIS — Z96651 Presence of right artificial knee joint: Secondary | ICD-10-CM | POA: Insufficient documentation

## 2016-10-10 DIAGNOSIS — K640 First degree hemorrhoids: Secondary | ICD-10-CM | POA: Diagnosis not present

## 2016-10-10 DIAGNOSIS — I10 Essential (primary) hypertension: Secondary | ICD-10-CM | POA: Diagnosis not present

## 2016-10-10 DIAGNOSIS — Z887 Allergy status to serum and vaccine status: Secondary | ICD-10-CM | POA: Insufficient documentation

## 2016-10-10 DIAGNOSIS — Z79899 Other long term (current) drug therapy: Secondary | ICD-10-CM | POA: Insufficient documentation

## 2016-10-10 DIAGNOSIS — Z86010 Personal history of colonic polyps: Secondary | ICD-10-CM | POA: Insufficient documentation

## 2016-10-10 DIAGNOSIS — E785 Hyperlipidemia, unspecified: Secondary | ICD-10-CM | POA: Insufficient documentation

## 2016-10-10 DIAGNOSIS — Z8249 Family history of ischemic heart disease and other diseases of the circulatory system: Secondary | ICD-10-CM | POA: Insufficient documentation

## 2016-10-10 DIAGNOSIS — M109 Gout, unspecified: Secondary | ICD-10-CM | POA: Diagnosis not present

## 2016-10-10 DIAGNOSIS — Z1211 Encounter for screening for malignant neoplasm of colon: Secondary | ICD-10-CM | POA: Diagnosis not present

## 2016-10-10 DIAGNOSIS — F419 Anxiety disorder, unspecified: Secondary | ICD-10-CM | POA: Diagnosis not present

## 2016-10-10 DIAGNOSIS — Z981 Arthrodesis status: Secondary | ICD-10-CM | POA: Diagnosis not present

## 2016-10-10 DIAGNOSIS — E079 Disorder of thyroid, unspecified: Secondary | ICD-10-CM | POA: Diagnosis not present

## 2016-10-10 DIAGNOSIS — K5730 Diverticulosis of large intestine without perforation or abscess without bleeding: Secondary | ICD-10-CM | POA: Diagnosis not present

## 2016-10-10 DIAGNOSIS — K648 Other hemorrhoids: Secondary | ICD-10-CM | POA: Diagnosis not present

## 2016-10-10 HISTORY — PX: COLONOSCOPY WITH PROPOFOL: SHX5780

## 2016-10-10 SURGERY — COLONOSCOPY WITH PROPOFOL
Anesthesia: General

## 2016-10-10 MED ORDER — LIDOCAINE 2% (20 MG/ML) 5 ML SYRINGE
INTRAMUSCULAR | Status: DC | PRN
  Administered 2016-10-10: 50 mg via INTRAVENOUS

## 2016-10-10 MED ORDER — PROPOFOL 500 MG/50ML IV EMUL
INTRAVENOUS | Status: DC | PRN
  Administered 2016-10-10: 200 ug/kg/min via INTRAVENOUS

## 2016-10-10 MED ORDER — PROPOFOL 500 MG/50ML IV EMUL
INTRAVENOUS | Status: AC
  Filled 2016-10-10: qty 50

## 2016-10-10 MED ORDER — LIDOCAINE HCL (PF) 2 % IJ SOLN
INTRAMUSCULAR | Status: AC
  Filled 2016-10-10: qty 2

## 2016-10-10 MED ORDER — PROPOFOL 10 MG/ML IV BOLUS
INTRAVENOUS | Status: DC | PRN
  Administered 2016-10-10: 60 mg via INTRAVENOUS
  Administered 2016-10-10: 40 mg via INTRAVENOUS

## 2016-10-10 MED ORDER — SODIUM CHLORIDE 0.9 % IV SOLN
INTRAVENOUS | Status: DC
  Administered 2016-10-10: 1000 mL via INTRAVENOUS

## 2016-10-10 NOTE — Anesthesia Postprocedure Evaluation (Signed)
Anesthesia Post Note  Patient: Tiny L Gilkes  Procedure(s) Performed: Procedure(s) (LRB): COLONOSCOPY WITH PROPOFOL (N/A)  Patient location during evaluation: Endoscopy Anesthesia Type: General Level of consciousness: awake and alert Pain management: pain level controlled Vital Signs Assessment: post-procedure vital signs reviewed and stable Respiratory status: spontaneous breathing, nonlabored ventilation, respiratory function stable and patient connected to nasal cannula oxygen Cardiovascular status: blood pressure returned to baseline and stable Postop Assessment: no signs of nausea or vomiting Anesthetic complications: no     Last Vitals:  Vitals:   10/10/16 0930 10/10/16 0940  BP: 113/77 117/75  Pulse: 63 (!) 58  Resp: 17 20  Temp:      Last Pain:  Vitals:   10/10/16 0807  TempSrc: Tympanic                 Orie Cuttino S

## 2016-10-10 NOTE — Anesthesia Post-op Follow-up Note (Cosign Needed)
Anesthesia QCDR form completed.        

## 2016-10-10 NOTE — Anesthesia Preprocedure Evaluation (Addendum)
Anesthesia Evaluation  Patient identified by MRN, date of birth, ID band Patient awake    Reviewed: Allergy & Precautions, NPO status , Patient's Chart, lab work & pertinent test results, reviewed documented beta blocker date and time   Airway Mallampati: II  TM Distance: >3 FB     Dental  (+) Upper Dentures, Lower Dentures   Pulmonary former smoker,           Cardiovascular hypertension, Pt. on medications      Neuro/Psych Anxiety    GI/Hepatic   Endo/Other    Renal/GU Renal disease     Musculoskeletal   Abdominal   Peds  Hematology   Anesthesia Other Findings Gout. ETOH hx.  Reproductive/Obstetrics                            Anesthesia Physical Anesthesia Plan  ASA: III  Anesthesia Plan: General   Post-op Pain Management:    Induction: Intravenous  PONV Risk Score and Plan:   Airway Management Planned:   Additional Equipment:   Intra-op Plan:   Post-operative Plan:   Informed Consent: I have reviewed the patients History and Physical, chart, labs and discussed the procedure including the risks, benefits and alternatives for the proposed anesthesia with the patient or authorized representative who has indicated his/her understanding and acceptance.     Plan Discussed with: CRNA  Anesthesia Plan Comments:         Anesthesia Quick Evaluation

## 2016-10-10 NOTE — Transfer of Care (Signed)
Immediate Anesthesia Transfer of Care Note  Patient: Lonnie Huber  Procedure(s) Performed: Procedure(s): COLONOSCOPY WITH PROPOFOL (N/A)  Patient Location: Endoscopy Unit  Anesthesia Type:General  Level of Consciousness: awake  Airway & Oxygen Therapy: Patient connected to nasal cannula oxygen  Post-op Assessment: Post -op Vital signs reviewed and stable  Post vital signs: stable  Last Vitals:  Vitals:   10/10/16 0807 10/10/16 0910  BP: 123/82 (!) 72/60  Pulse:  84  Resp: (!) 78 18  Temp: (!) 36 C 36.1 C    Last Pain:  Vitals:   10/10/16 0807  TempSrc: Tympanic         Complications: No apparent anesthesia complications

## 2016-10-10 NOTE — Op Note (Signed)
[facility] Gastroenterology Patient Name: Jamespaul Secrist Procedure Date: 10/10/2016 8:45 AM MRN: [id number] Account #: [id number] Date of Birth: 1945-07-04 Admit Type: Outpatient Age: 71 Room: [facility] ENDO ROOM 4 Gender: Male Note Status: Finalized Procedure:            Colonoscopy Indications:          High risk colon cancer surveillance: Personal history                        of colonic polyps Providers:            Wyline Mood MD, MD Referring MD:         Shelbie Ammons. Sherryll Burger (Referring MD) Medicines:            Monitored Anesthesia Care Complications:        No immediate complications. Procedure:            Pre-Anesthesia Assessment:                       - Prior to the procedure, a History and Physical was                        performed, and patient medications, allergies and                        sensitivities were reviewed. The patient's tolerance of                        previous anesthesia was reviewed.                       - The risks and benefits of the procedure and the                        sedation options and risks were discussed with the                        patient. All questions were answered and informed                        consent was obtained.                       - ASA Grade Assessment: III - A patient with severe                        systemic disease.                       After obtaining informed consent, the colonoscope was                        passed under direct vision. Throughout the procedure,                        the patient's blood pressure, pulse, and oxygen                        saturations were monitored continuously. The                        Colonoscope  was introduced through the anus and                        advanced to the the cecum, identified by the                        appendiceal orifice, IC valve and transillumination.                        The colonoscopy was performed with ease. The patient                         tolerated the procedure well. The quality of the bowel                        preparation was good. Findings:      The perianal and digital rectal examinations were normal.      Multiple medium-mouthed diverticula were found in the entire colon.      Non-bleeding internal hemorrhoids were found during retroflexion. The       hemorrhoids were small and Grade I (internal hemorrhoids that do not       prolapse).      The exam was otherwise without abnormality.      The terminal ileum appeared normal. Impression:           - Diverticulosis in the entire examined colon.                       - Non-bleeding internal hemorrhoids.                       - The examination was otherwise normal.                       - No specimens collected. Recommendation:       - Discharge patient to home (with escort).                       - Resume previous diet.                       - Continue present medications.                       - Repeat colonoscopy in 5 years for surveillance. Procedure Code(s):    --- Professional ---                       Z6109, Colorectal cancer screening; colonoscopy on                        individual at high risk Diagnosis Code(s):    --- Professional ---                       Z86.010, Personal history of colonic polyps                       K64.0, First degree hemorrhoids                       K57.30, Diverticulosis of large intestine without  perforation or abscess without bleeding CPT copyright 2016 [employer] Medical Association. All rights reserved. The codes documented in this report are preliminary and upon coder review may  be revised to meet current compliance requirements. Wyline MoodKiran Michille Mcelrath, MD Wyline MoodKiran Danee Soller MD, MD 10/10/2016 9:07:58 AM This report has been signed electronically. Number of Addenda: 0 Note Initiated On: 10/10/2016 8:45 AM Scope Withdrawal Time: 0 hours 11 minutes 3 seconds  Total Procedure Duration: 0 hours 14 minutes 32 seconds        [facility]

## 2016-10-10 NOTE — OR Nursing (Signed)
Got in Terminal Ileum

## 2016-10-10 NOTE — H&P (Signed)
  Lonnie MoodKiran Alegria Dominique MD [address] [place], [place] [address] Phone: [phone] Fax : [phone]  Primary Care Physician:  Ellyn HackShah, Lonnie Asad A, MD Primary Gastroenterologist:  Dr. Wyline MoodKiran [place] Huber   Pre-Procedure History & Physical: HPI:  Lonnie Huber is Huber 71 y.o. male is here for an colonoscopy.   Past Medical History:  Diagnosis Date  . Gout   . Hyperlipidemia   . Hypertension   . Thyroid disease     Past Surgical History:  Procedure Laterality Date  . LUMBAR FUSION  10/12/2009  . LUMBAR LAMINECTOMY  10/12/2009  . REPLACEMENT TOTAL KNEE Right 08/2013    Prior to Admission medications   Medication Sig Start Date End Date Taking? Authorizing Provider  allopurinol (ZYLOPRIM) 300 MG tablet Take 1 tablet (300 mg total) by mouth daily. 04/16/16  Yes Ellyn HackShah, Lonnie Asad A, MD  atorvastatin (LIPITOR) 40 MG tablet Take 1 tablet (40 mg total) by mouth daily. 08/19/16  Yes Ellyn HackShah, Lonnie Asad A, MD  losartan-hydrochlorothiazide (HYZAAR) 100-25 MG tablet Take 1 tablet by mouth daily. 08/19/16  Yes Ellyn HackShah, Lonnie Asad A, MD  Omega 3 1000 MG CAPS Take 1 capsule by mouth 2 (two) times daily.   Yes [provider]  tadalafil (CIALIS) 20 MG tablet Take 0.5-1 tablets (10-20 mg total) by mouth every other day as needed for erectile dysfunction. 12/29/14   Dennison MascotMorrisey, [place], MD    Allergies as of 09/04/2016 - Review Complete 08/19/2016  Allergen Reaction Noted  . Fluzone [flu virus vaccine]  12/21/2014    Family History  Problem Relation Age of Onset  . Coronary artery disease Mother   . Congestive Heart Failure Father     Social History   Social History  . Marital status: Married    Spouse name: N/Huber  . Number of children: N/Huber  . Years of education: N/Huber   Occupational History  . Not on file.   Social History Main Topics  . Smoking status: Former Smoker    Types: Cigarettes    Quit date: 09/20/1978  . Smokeless tobacco: Never Used  . Alcohol use 0.0 - 12.6 oz/week  . Drug use: No    . Sexual activity: Not on file   Other Topics Concern  . Not on file   Social History Narrative  . No narrative on file    Review of Systems: See HPI, otherwise negative ROS  Physical Exam: BP 123/82   Temp (!) 96.8 F (36 C) (Tympanic)   Resp (!) 78   Ht 5\' 11"  (1.803 m)   Wt 195 lb (88.5 kg)   SpO2 100%   BMI 27.20 kg/m  General:   Alert,  pleasant and cooperative in NAD Head:  Normocephalic and atraumatic. Neck:  Supple; no masses or thyromegaly. Lungs:  Clear throughout to auscultation.    Heart:  Regular rate and rhythm. Abdomen:  Soft, nontender and nondistended. Normal bowel sounds, without guarding, and without rebound.   Neurologic:  Alert and  oriented x4;  grossly normal neurologically.  Impression/Plan: Lonnie Huber is here for an colonoscopy to be performed for surveillance due to prior history of colon polyps   Risks, benefits, limitations, and alternatives regarding  colonoscopy have been reviewed with the patient.  Questions have been answered.  All parties agreeable.   Lonnie MoodKiran Habib Kise, MD  10/10/2016, 8:44 AM

## 2016-10-13 ENCOUNTER — Encounter: Payer: Self-pay | Admitting: Gastroenterology

## 2016-10-30 ENCOUNTER — Telehealth: Payer: Self-pay | Admitting: Family Medicine

## 2016-10-30 DIAGNOSIS — M10071 Idiopathic gout, right ankle and foot: Secondary | ICD-10-CM

## 2016-10-30 NOTE — Telephone Encounter (Signed)
Pt is not scheduled to come back in until 02/19/17. However he has run out of allopurinol. Asking that [facility]. Also, pt is requesting refill on cialis asking that this also go [facility]

## 2016-10-31 MED ORDER — ALLOPURINOL 300 MG PO TABS: 300.0000 mg | ORAL_TABLET | Freq: Every day | ORAL | 0 refills | Status: DC

## 2016-10-31 NOTE — Telephone Encounter (Signed)
Medication has been refilled and [facility] to [facility] 

## 2016-11-02 ENCOUNTER — Other Ambulatory Visit: Payer: Self-pay | Admitting: Family Medicine

## 2017-01-28 ENCOUNTER — Other Ambulatory Visit: Payer: Self-pay | Admitting: Family Medicine

## 2017-01-28 DIAGNOSIS — M10071 Idiopathic gout, right ankle and foot: Secondary | ICD-10-CM

## 2017-02-04 DIAGNOSIS — H524 Presbyopia: Secondary | ICD-10-CM | POA: Diagnosis not present

## 2017-02-10 ENCOUNTER — Telehealth: Payer: Self-pay | Admitting: Family Medicine

## 2017-02-10 NOTE — Telephone Encounter (Signed)
Copied from CRM #[id number]. Topic: Inquiry >> Feb 10, 2017  9:55 AM Yvonna Alanisobinson, Andra M wrote: Reason for CRM: Patient called requesting a refill of his Gout Medication ASAP. Patient is out of this medication. Patient stated that his pharmacy tried to contact the office, but our phones were down.

## 2017-02-10 NOTE — Telephone Encounter (Signed)
[facility]. Spoke with the pt and he is complety out therefore [facility]

## 2017-02-19 ENCOUNTER — Ambulatory Visit: Payer: Medicare HMO | Admitting: Family Medicine

## 2017-02-23 ENCOUNTER — Ambulatory Visit: Payer: Medicare HMO | Admitting: Family Medicine

## 2017-02-25 ENCOUNTER — Ambulatory Visit: Payer: Medicare HMO | Admitting: Family Medicine

## 2017-02-26 ENCOUNTER — Ambulatory Visit: Payer: Medicare HMO | Admitting: Family Medicine

## 2017-02-26 ENCOUNTER — Encounter: Payer: Self-pay | Admitting: Family Medicine

## 2017-02-26 VITALS — BP 126/78 | HR 98 | Temp 97.9°F | Resp 16 | Wt 194.5 lb

## 2017-02-26 DIAGNOSIS — I10 Essential (primary) hypertension: Secondary | ICD-10-CM | POA: Diagnosis not present

## 2017-02-26 DIAGNOSIS — M1A0710 Idiopathic chronic gout, right ankle and foot, without tophus (tophi): Secondary | ICD-10-CM | POA: Diagnosis not present

## 2017-02-26 DIAGNOSIS — E7800 Pure hypercholesterolemia, unspecified: Secondary | ICD-10-CM | POA: Diagnosis not present

## 2017-02-26 MED ORDER — LOSARTAN POTASSIUM-HCTZ 100-25 MG PO TABS: 1.0000 | ORAL_TABLET | Freq: Every day | ORAL | 1 refills | Status: DC

## 2017-02-26 MED ORDER — ATORVASTATIN CALCIUM 40 MG PO TABS: 40.0000 mg | ORAL_TABLET | Freq: Every day | ORAL | 1 refills | Status: DC

## 2017-02-26 NOTE — Progress Notes (Signed)
Name: Lonnie Huber   MRN: [id number]    DOB: 06/23/1945   Date:02/26/2017       Progress Note  Subjective  Chief Complaint  Chief Complaint  Patient presents with  . Medication Refill    BP meds and lipitor  . Hypertension    f/u  . Hyperlipidemia    f/u    Hypertension  This is a chronic problem. The problem is unchanged. The problem is controlled. Pertinent negatives include no blurred vision, chest pain, headaches, palpitations or shortness of breath. Past treatments include angiotensin blockers and diuretics. There is no history of kidney disease, CAD/MI or CVA.  Hyperlipidemia  This is a chronic problem. The problem is controlled. Recent lipid tests were reviewed and are normal. Pertinent negatives include no chest pain, leg pain, myalgias or shortness of breath. Current antihyperlipidemic treatment includes statins.     Past Medical History:  Diagnosis Date  . Gout   . Hyperlipidemia   . Hypertension   . Thyroid disease     Past Surgical History:  Procedure Laterality Date  . COLONOSCOPY WITH PROPOFOL N/A 10/10/2016   Procedure: COLONOSCOPY WITH PROPOFOL;  Surgeon: Wyline MoodAnna, Kiran, MD;  [facility];  Service: Endoscopy;  Laterality: N/A;  . LUMBAR FUSION  10/12/2009  . LUMBAR LAMINECTOMY  10/12/2009  . REPLACEMENT TOTAL KNEE Right 08/2013    Family History  Problem Relation Age of Onset  . Coronary artery disease Mother   . Congestive Heart Failure Father     Social History   Socioeconomic History  . Marital status: Married    Spouse name: Not on file  . Number of children: Not on file  . Years of education: Not on file  . Highest education level: Not on file  Social Needs  . Financial resource strain: Not on file  . Food insecurity - worry: Not on file  . Food insecurity - inability: Not on file  . Transportation needs - medical: Not on file  . Transportation needs - non-medical: Not on file  Occupational History  . Not on file  Tobacco  Use  . Smoking status: Former Smoker    Types: Cigarettes    Last attempt to quit: 09/20/1978    Years since quitting: 38.4  . Smokeless tobacco: Never Used  Substance and Sexual Activity  . Alcohol use: Yes    Alcohol/week: 0.0 - 12.6 oz  . Drug use: No  . Sexual activity: Yes  Other Topics Concern  . Not on file  Social History Narrative  . Not on file     Current Outpatient Medications:  .  allopurinol (ZYLOPRIM) 300 MG tablet, TAKE 1 TABLET BY MOUTH EVERY DAY, Disp: 90 tablet, Rfl: 0 .  atorvastatin (LIPITOR) 40 MG tablet, Take 1 tablet (40 mg total) by mouth daily., Disp: 90 tablet, Rfl: 1 .  CIALIS 20 MG tablet, TAKE 1/2 TO 1 TABLET BY MOUTH EVERY OTHER DAY AS NEEDED FOR ERECTILE DYSFUNCTION., Disp: 6 tablet, Rfl: 0 .  losartan-hydrochlorothiazide (HYZAAR) 100-25 MG tablet, Take 1 tablet by mouth daily., Disp: 90 tablet, Rfl: 1 .  Omega 3 1000 MG CAPS, Take 1 capsule by mouth 2 (two) times daily., Disp: , Rfl:   Allergies  Allergen Reactions  . Fluzone [Flu Virus Vaccine]      Review of Systems  Eyes: Negative for blurred vision.  Respiratory: Negative for shortness of breath.   Cardiovascular: Negative for chest pain and palpitations.  Musculoskeletal: Negative for myalgias.  Neurological:  Negative for headaches.     Objective  Vitals:   02/26/17 1101  BP: 126/78  Pulse: 98  Resp: 16  Temp: 97.9 F (36.6 C)  TempSrc: Oral  SpO2: 98%  Weight: 194 lb 8 oz (88.2 kg)    Physical Exam  Constitutional: He is oriented to person, place, and time and well-developed, well-nourished, and in no distress.  HENT:  Head: Normocephalic and atraumatic.  Cardiovascular: Normal rate, regular rhythm, S1 normal, S2 normal and normal heart sounds.  No murmur heard. Pulmonary/Chest: Effort normal and breath sounds normal. He has no wheezes.  Abdominal: Soft. Bowel sounds are normal. There is no tenderness.  Musculoskeletal: He exhibits no edema.  Neurological: He is alert  and oriented to person, place, and time.  Psychiatric: Mood, memory, affect and judgment normal.       Assessment & Plan  1. Benign essential HTN BP stable on present antihypertensive treatment - losartan-hydrochlorothiazide (HYZAAR) 100-25 MG tablet; Take 1 tablet daily by mouth.  Dispense: 90 tablet; Refill: 1  2. Pure hypercholesterolemia  - atorvastatin (LIPITOR) 40 MG tablet; Take 1 tablet (40 mg total) daily by mouth.  Dispense: 90 tablet; Refill: 1 - Lipid panel  3. Idiopathic chronic gout of right foot without tophus Stable last uric acid level at goal, continue on allopurinol   Lonnie Huber Asad A. Faylene KurtzShah [facility] 02/26/2017 11:26 AM

## 2017-05-22 ENCOUNTER — Other Ambulatory Visit: Payer: Self-pay | Admitting: Family Medicine

## 2017-05-22 DIAGNOSIS — M10071 Idiopathic gout, right ankle and foot: Secondary | ICD-10-CM

## 2017-06-16 ENCOUNTER — Other Ambulatory Visit: Payer: Self-pay | Admitting: Family Medicine

## 2017-06-16 DIAGNOSIS — M10071 Idiopathic gout, right ankle and foot: Secondary | ICD-10-CM

## 2017-06-16 NOTE — Telephone Encounter (Signed)
Copied from CRM [phone]. Topic: Quick Communication - Rx Refill/Question >> Jun 16, 2017  4:19 PM Lonnie Huber, Lonnie Huber, [place] wrote: Medication: allopurinol 300 mg   Has the patient contacted their pharmacy? yes   (Agent: If no, request that the patient contact the pharmacy for the refill.)   Preferred [facility] (with phone number [facility] street [facility]  [address]    Agent: Please be advised that RX refills may take up to 3 business days. We ask that you follow-up with your pharmacy.

## 2017-06-17 NOTE — Telephone Encounter (Signed)
Request for refill for allopurinol please.

## 2017-06-18 MED ORDER — ALLOPURINOL 300 MG PO TABS: 300.0000 mg | ORAL_TABLET | Freq: Every day | ORAL | 0 refills | Status: DC

## 2017-06-18 NOTE — Telephone Encounter (Signed)
voice mailbox full will create CRM

## 2017-06-18 NOTE — Telephone Encounter (Signed)
Please let pt know that I'm sending in a refill, but we'd like to see him in the office The HCTZ that he takes can worsen gout, so we need to talk about stopping that He also needs labs I look forward to meeting him

## 2017-07-03 ENCOUNTER — Ambulatory Visit (INDEPENDENT_AMBULATORY_CARE_PROVIDER_SITE_OTHER): Payer: Medicare HMO | Admitting: Family Medicine

## 2017-07-03 ENCOUNTER — Encounter: Payer: Self-pay | Admitting: Family Medicine

## 2017-07-03 DIAGNOSIS — Z5181 Encounter for therapeutic drug level monitoring: Secondary | ICD-10-CM | POA: Diagnosis not present

## 2017-07-03 DIAGNOSIS — F1010 Alcohol abuse, uncomplicated: Secondary | ICD-10-CM | POA: Diagnosis not present

## 2017-07-03 DIAGNOSIS — E7800 Pure hypercholesterolemia, unspecified: Secondary | ICD-10-CM | POA: Diagnosis not present

## 2017-07-03 DIAGNOSIS — M1A0710 Idiopathic chronic gout, right ankle and foot, without tophus (tophi): Secondary | ICD-10-CM

## 2017-07-03 DIAGNOSIS — I10 Essential (primary) hypertension: Secondary | ICD-10-CM

## 2017-07-03 DIAGNOSIS — N182 Chronic kidney disease, stage 2 (mild): Secondary | ICD-10-CM | POA: Diagnosis not present

## 2017-07-03 DIAGNOSIS — R739 Hyperglycemia, unspecified: Secondary | ICD-10-CM | POA: Diagnosis not present

## 2017-07-03 NOTE — Assessment & Plan Note (Signed)
Check glucose, fasting, along with A1c

## 2017-07-03 NOTE — Assessment & Plan Note (Signed)
Well-controlled; continue medicine; limit salt, try DASH guidelines; see AVS

## 2017-07-03 NOTE — Assessment & Plan Note (Signed)
Check lipids; continue statin 

## 2017-07-03 NOTE — Assessment & Plan Note (Signed)
Check uric acid; try to limit gravy

## 2017-07-03 NOTE — Assessment & Plan Note (Signed)
Check labs today.

## 2017-07-03 NOTE — Assessment & Plan Note (Signed)
Avoid NSAIDs; stay hydrated 

## 2017-07-03 NOTE — Patient Instructions (Addendum)
Try to follow the DASH guidelines (DASH stands for Dietary Approaches to Stop Hypertension). Try to limit the sodium in your diet to no more than 1,500mg  of sodium per day. Certainly try to not exceed 2,000 mg per day at the very most. Do not add salt when cooking or at the table.  Check the sodium amount on labels when shopping, and choose items lower in sodium when given a choice. Avoid or limit foods that already contain a lot of sodium. Eat a diet rich in fruits and vegetables and whole grains, and try to lose weight if overweight or obese  Try to limit saturated fats in your diet (bologna, hot dogs, barbeque, cheeseburgers, hamburgers, steak, bacon, sausage, cheese, etc.) and get more fresh fruits, vegetables, and whole grains   DASH Eating Plan DASH stands for "Dietary Approaches to Stop Hypertension." The DASH eating plan is a healthy eating plan that has been shown to reduce high blood pressure (hypertension). It may also reduce your risk for type 2 diabetes, heart disease, and stroke. The DASH eating plan may also help with weight loss. What are tips for following this plan? General guidelines  Avoid eating more than 2,300 mg (milligrams) of salt (sodium) a day. If you have hypertension, you may need to reduce your sodium intake to 1,500 mg a day.  Limit alcohol intake to no more than 1 drink a day for nonpregnant women and 2 drinks a day for men. One drink equals 12 oz of beer, 5 oz of wine, or 1 oz of hard liquor.  Work with your health care provider to maintain a healthy body weight or to lose weight. Ask what an ideal weight is for you.  Get at least 30 minutes of exercise that causes your heart to beat faster (aerobic exercise) most days of the week. Activities may include walking, swimming, or biking.  Work with your health care provider or diet and nutrition specialist (dietitian) to adjust your eating plan to your individual calorie needs. Reading food labels  Check food labels  for the amount of sodium per serving. Choose foods with less than 5 percent of the Daily Value of sodium. Generally, foods with less than 300 mg of sodium per serving fit into this eating plan.  To find whole grains, look for the word "whole" as the first word in the ingredient list. Shopping  Buy products labeled as "low-sodium" or "no salt added."  Buy fresh foods. Avoid canned foods and premade or frozen meals. Cooking  Avoid adding salt when cooking. Use salt-free seasonings or herbs instead of table salt or sea salt. Check with your health care provider or pharmacist before using salt substitutes.  Do not fry foods. Cook foods using healthy methods such as baking, boiling, grilling, and broiling instead.  Cook with heart-healthy oils, such as olive, canola, soybean, or sunflower oil. Meal planning   Eat a balanced diet that includes: ? 5 or more servings of fruits and vegetables each day. At each meal, try to fill half of your plate with fruits and vegetables. ? Up to 6-8 servings of whole grains each day. ? Less than 6 oz of lean meat, poultry, or fish each day. A 3-oz serving of meat is about the same size as a deck of cards. One egg equals 1 oz. ? 2 servings of low-fat dairy each day. ? A serving of nuts, seeds, or beans 5 times each week. ? Heart-healthy fats. Healthy fats called Omega-3 fatty acids are found  in foods such as flaxseeds and coldwater fish, like sardines, salmon, and mackerel.  Limit how much you eat of the following: ? Canned or prepackaged foods. ? Food that is high in trans fat, such as fried foods. ? Food that is high in saturated fat, such as fatty meat. ? Sweets, desserts, sugary drinks, and other foods with added sugar. ? Full-fat dairy products.  Do not salt foods before eating.  Try to eat at least 2 vegetarian meals each week.  Eat more home-cooked food and less restaurant, buffet, and fast food.  When eating at a restaurant, ask that your food  be prepared with less salt or no salt, if possible. What foods are recommended? The items listed may not be a complete list. Talk with your dietitian about what dietary choices are best for you. Grains Whole-grain or whole-wheat bread. Whole-grain or whole-wheat pasta. Brown rice. [place]. Bulgur. Whole-grain and low-sodium cereals. Pita bread. Low-fat, low-sodium crackers. Whole-wheat flour tortillas. Vegetables Fresh or frozen vegetables (raw, steamed, roasted, or grilled). Low-sodium or reduced-sodium tomato and vegetable juice. Low-sodium or reduced-sodium tomato sauce and tomato paste. Low-sodium or reduced-sodium canned vegetables. Fruits All fresh, dried, or frozen fruit. Canned fruit in natural juice (without added sugar). Meat and other protein foods Skinless chicken or [place]. Ground chicken or [place]. Pork with fat trimmed off. Fish and seafood. Egg whites. Dried beans, peas, or lentils. Unsalted nuts, nut butters, and seeds. Unsalted canned beans. Lean cuts of beef with fat trimmed off. Low-sodium, lean deli meat. Dairy Low-fat (1%) or fat-free (skim) milk. Fat-free, low-fat, or reduced-fat cheeses. Nonfat, low-sodium ricotta or cottage cheese. Low-fat or nonfat yogurt. Low-fat, low-sodium cheese. Fats and oils Soft margarine without trans fats. Vegetable oil. Low-fat, reduced-fat, or light mayonnaise and salad dressings (reduced-sodium). Canola, safflower, olive, soybean, and sunflower oils. Avocado. Seasoning and other foods Herbs. Spices. Seasoning mixes without salt. Unsalted popcorn and pretzels. Fat-free sweets. What foods are not recommended? The items listed may not be a complete list. Talk with your dietitian about what dietary choices are best for you. Grains Baked goods made with fat, such as croissants, muffins, or some breads. Dry pasta or rice meal packs. Vegetables Creamed or fried vegetables. Vegetables in a cheese sauce. Regular canned vegetables (not  low-sodium or reduced-sodium). Regular canned tomato sauce and paste (not low-sodium or reduced-sodium). Regular tomato and vegetable juice (not low-sodium or reduced-sodium). Angie Fava. Olives. Fruits Canned fruit in a light or heavy syrup. Fried fruit. Fruit in cream or butter sauce. Meat and other protein foods Fatty cuts of meat. Ribs. Fried meat. Berniece Salines. Sausage. Bologna and other processed lunch meats. Salami. Fatback. Hotdogs. Bratwurst. Salted nuts and seeds. Canned beans with added salt. Canned or smoked fish. Whole eggs or egg yolks. Chicken or [place] with skin. Dairy Whole or 2% milk, cream, and half-and-half. Whole or full-fat cream cheese. Whole-fat or sweetened yogurt. Full-fat cheese. Nondairy creamers. Whipped toppings. Processed cheese and cheese spreads. Fats and oils Butter. Stick margarine. Lard. Shortening. Ghee. Bacon fat. Tropical oils, such as coconut, palm kernel, or palm oil. Seasoning and other foods Salted popcorn and pretzels. Onion salt, garlic salt, seasoned salt, table salt, and sea salt. Worcestershire sauce. Tartar sauce. Barbecue sauce. Teriyaki sauce. Soy sauce, including reduced-sodium. Steak sauce. Canned and packaged gravies. Fish sauce. Oyster sauce. Cocktail sauce. Horseradish that you find on the shelf. Ketchup. Mustard. Meat flavorings and tenderizers. Bouillon cubes. Hot sauce and Tabasco sauce. Premade or packaged marinades. Premade or packaged taco seasonings. Relishes.  Regular salad dressings. Where to find more information:  National Heart, Lung, and Blood Institute: PopSteam.iswww.nhlbi.nih.gov  American Heart Association: www.heart.org Summary  The DASH eating plan is a healthy eating plan that has been shown to reduce high blood pressure (hypertension). It may also reduce your risk for type 2 diabetes, heart disease, and stroke.  With the DASH eating plan, you should limit salt (sodium) intake to 2,300 mg a day. If you have hypertension, you may need to reduce  your sodium intake to 1,500 mg a day.  When on the DASH eating plan, aim to eat more fresh fruits and vegetables, whole grains, lean proteins, low-fat dairy, and heart-healthy fats.  Work with your health care provider or diet and nutrition specialist (dietitian) to adjust your eating plan to your individual calorie needs. This information is not intended to replace advice given to you by your health care provider. Make sure you discuss any questions you have with your health care provider. Document Released: 03/20/2011 Document Revised: 03/24/2016 Document Reviewed: 03/24/2016 Elsevier Interactive Patient Education  [employer]2018 Elsevier Inc.  If you have not heard anything from my staff in a week about any orders/referrals/studies from today, please contact us here to follow-up [phone]
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
## 2017-07-03 NOTE — Assessment & Plan Note (Signed)
We talked about this; if/when patient thinks he needs help, he says he'll get help

## 2017-07-04 LAB — COMPLETE METABOLIC PANEL WITH GFR
AG Ratio: 1.4 (calc) (ref 1.0–2.5)
ALKALINE PHOSPHATASE (APISO): 51 U/L (ref 40–115)
ALT: 27 U/L (ref 9–46)
AST: 36 U/L — AB (ref 10–35)
Albumin: 4 g/dL (ref 3.6–5.1)
BUN: 12 mg/dL (ref 7–25)
CHLORIDE: 96 mmol/L — AB (ref 98–110)
CO2: 28 mmol/L (ref 20–32)
Calcium: 9.8 mg/dL (ref 8.6–10.3)
Creat: 1.12 mg/dL (ref 0.70–1.18)
GFR, Est African American: 77 mL/min/{1.73_m2} (ref >=60)
GFR, Est Non African American: 66 mL/min/{1.73_m2} (ref >=60)
GLUCOSE: 106 mg/dL — AB (ref 65–99)
Globulin: 2.9 g/dL (calc) (ref 1.9–3.7)
Potassium: 3.3 mmol/L — ABNORMAL LOW (ref 3.5–5.3)
Sodium: 134 mmol/L — ABNORMAL LOW (ref 135–146)
Total Bilirubin: 1.2 mg/dL (ref 0.2–1.2)
Total Protein: 6.9 g/dL (ref 6.1–8.1)

## 2017-07-04 LAB — LIPID PANEL
Cholesterol: 109 mg/dL (ref <200)
HDL: 41 mg/dL (ref >40)
LDL CHOLESTEROL (CALC): 54 mg/dL
NON-HDL CHOLESTEROL (CALC): 68 mg/dL (ref <130)
Total CHOL/HDL Ratio: 2.7 (calc) (ref <5.0)
Triglycerides: 60 mg/dL (ref <150)

## 2017-07-04 LAB — CBC WITH DIFFERENTIAL/PLATELET
Basophils Absolute: 18 cells/uL (ref 0–200)
Basophils Relative: 0.3 %
EOS PCT: 2 %
Eosinophils Absolute: 120 cells/uL (ref 15–500)
HCT: 37.7 % — ABNORMAL LOW (ref 38.5–50.0)
Hemoglobin: 13.2 g/dL (ref 13.2–17.1)
Lymphs Abs: 1440 cells/uL (ref 850–3900)
MCH: 30.8 pg (ref 27.0–33.0)
MCHC: 35 g/dL (ref 32.0–36.0)
MCV: 88.1 fL (ref 80.0–100.0)
MONOS PCT: 8.1 %
MPV: 11.1 fL (ref 7.5–12.5)
NEUTROS PCT: 65.6 %
Neutro Abs: 3936 cells/uL (ref 1500–7800)
PLATELETS: 218 10*3/uL (ref 140–400)
RBC: 4.28 10*6/uL (ref 4.20–5.80)
RDW: 13.6 % (ref 11.0–15.0)
TOTAL LYMPHOCYTE: 24 %
WBC: 6 10*3/uL (ref 3.8–10.8)
WBCMIX: 486 {cells}/uL (ref 200–950)

## 2017-07-04 LAB — HEMOGLOBIN A1C
EAG (MMOL/L): 6.2 (calc)
HEMOGLOBIN A1C: 5.5 %{Hb} (ref <5.7)
MEAN PLASMA GLUCOSE: 111 (calc)

## 2017-07-04 LAB — URIC ACID: Uric Acid, Serum: 3.5 mg/dL — ABNORMAL LOW (ref 4.0–8.0)

## 2017-07-06 ENCOUNTER — Other Ambulatory Visit: Payer: Self-pay | Admitting: Family Medicine

## 2017-07-06 MED ORDER — POTASSIUM CHLORIDE ER 10 MEQ PO TBCR: 10.0000 meq | EXTENDED_RELEASE_TABLET | Freq: Two times a day (BID) | ORAL | 0 refills | Status: DC

## 2017-07-06 NOTE — Progress Notes (Signed)
Potassium Rx for a few days

## 2017-07-07 ENCOUNTER — Telehealth: Payer: Self-pay

## 2017-07-07 NOTE — Telephone Encounter (Signed)
Called pt informed him of lab results. Pt gave verbal understanding.  

## 2017-07-07 NOTE — Telephone Encounter (Signed)
-----   Message from Kerman PasseyMelinda P Lada, MD sent at 07/06/2017  5:37 PM EDT ----- Please let pt know that his uric acid is excellent; cholesterol is fabulous; his potassium is low so I'll send a few days of KCl to his pharmacy; 3 month blood sugar average is excellent; thank you

## 2017-09-21 ENCOUNTER — Other Ambulatory Visit: Payer: Self-pay

## 2017-09-21 DIAGNOSIS — M10071 Idiopathic gout, right ankle and foot: Secondary | ICD-10-CM

## 2017-09-21 MED ORDER — ALLOPURINOL 300 MG PO TABS: 300.0000 mg | ORAL_TABLET | Freq: Every day | ORAL | 1 refills | Status: DC

## 2017-09-21 NOTE — Telephone Encounter (Signed)
Last Cr and uric acid reviewed; he was on thiazide before I met him and is controlled; Rx approved

## 2017-12-29 ENCOUNTER — Ambulatory Visit (INDEPENDENT_AMBULATORY_CARE_PROVIDER_SITE_OTHER): Payer: Medicare HMO | Admitting: Family Medicine

## 2017-12-29 ENCOUNTER — Encounter: Payer: Self-pay | Admitting: Family Medicine

## 2017-12-29 VITALS — BP 122/74 | HR 87 | Temp 98.1°F | Ht 71.0 in | Wt 201.5 lb

## 2017-12-29 DIAGNOSIS — N182 Chronic kidney disease, stage 2 (mild): Secondary | ICD-10-CM

## 2017-12-29 DIAGNOSIS — E7800 Pure hypercholesterolemia, unspecified: Secondary | ICD-10-CM

## 2017-12-29 DIAGNOSIS — R634 Abnormal weight loss: Secondary | ICD-10-CM

## 2017-12-29 DIAGNOSIS — D649 Anemia, unspecified: Secondary | ICD-10-CM | POA: Diagnosis not present

## 2017-12-29 DIAGNOSIS — R739 Hyperglycemia, unspecified: Secondary | ICD-10-CM | POA: Diagnosis not present

## 2017-12-29 DIAGNOSIS — E876 Hypokalemia: Secondary | ICD-10-CM | POA: Diagnosis not present

## 2017-12-29 DIAGNOSIS — R42 Dizziness and giddiness: Secondary | ICD-10-CM

## 2017-12-29 DIAGNOSIS — M1A0710 Idiopathic chronic gout, right ankle and foot, without tophus (tophi): Secondary | ICD-10-CM

## 2017-12-29 DIAGNOSIS — F1010 Alcohol abuse, uncomplicated: Secondary | ICD-10-CM

## 2017-12-29 DIAGNOSIS — R55 Syncope and collapse: Secondary | ICD-10-CM

## 2017-12-29 DIAGNOSIS — I951 Orthostatic hypotension: Secondary | ICD-10-CM

## 2017-12-29 DIAGNOSIS — I10 Essential (primary) hypertension: Secondary | ICD-10-CM

## 2017-12-29 MED ORDER — LOSARTAN POTASSIUM 100 MG PO TABS: 100.0000 mg | ORAL_TABLET | Freq: Every day | ORAL | 0 refills | Status: DC

## 2017-12-29 MED ORDER — LOSARTAN POTASSIUM 50 MG PO TABS: 50.0000 mg | ORAL_TABLET | Freq: Every day | ORAL | 0 refills | Status: DC

## 2017-12-29 NOTE — Assessment & Plan Note (Addendum)
Stop HCTZ as soon as the new medicine (plain losartan) arrives from mail order; he did not want to get locally, but agreed to cut the HCTZ by 50% until new plain ARB comes in and he'll take that and no HCTZ

## 2017-12-29 NOTE — Progress Notes (Signed)
BP 122/74   Pulse 87   Temp 98.1 F (36.7 C) (Oral)   Ht 5\' 11"  (1.803 m)   Wt 201 lb 8 oz (91.4 kg)   SpO2 97%   BMI 28.10 kg/m    Subjective:    Patient ID: Lonnie Huber, male    DOB: 12-09-1945, 72 y.o.   MRN: [id number]  HPI: Lonnie Huber is a 72 y.o. male  Chief Complaint  Patient presents with  . panic attacks    Pt states he feels like he is going to fall out.  Doesnt know how to explain and I asked about several symptoms    HPI Patient is here for an acute visit He is having several symptoms; thinks he is having panic attacks; years ago, he was diagnosed with panic attacks, anxiety; he never took anything for it; the older he gets, the more frustrated he gets Right now, he tries to get up and doesn't know if he is going to pass out or fall; he does not check BP  I asked about weight loss; he had surgery on his back and was walking and doing well, then gained some back  He has gout, but is on HCTZ from previous provider  No alcohol for 4-5 weeks at all; at one point, he was drinking a serving of wine and would drink too much Last labs were reviewed; Hct slightly low at 37.7 in March; normal A1c, low Na+, K+, and Cl-; mildly elevated AST 36  Depression screen [facility] 2/9 12/29/2017 07/03/2017 02/26/2017 08/19/2016 05/19/2016  Decreased Interest 0 0 0 0 0  Down, Depressed, Hopeless 1 0 0 0 0  PHQ - 2 Score 1 0 0 0 0  Altered sleeping 0 - - - -  Tired, decreased energy 1 - - - -  Change in appetite 0 - - - -  Feeling bad or failure about yourself  0 - - - -  Trouble concentrating 0 - - - -  Moving slowly or fidgety/restless 0 - - - -  Suicidal thoughts 0 - - - -  PHQ-9 Score 2 - - - -    Relevant past medical, surgical, family and social history reviewed Past Medical History:  Diagnosis Date  . Gout   . Hyperlipidemia   . Hypertension   . Thyroid disease    Past Surgical History:  Procedure Laterality Date  . COLONOSCOPY WITH PROPOFOL N/A 10/10/2016   Procedure: COLONOSCOPY WITH PROPOFOL;  Surgeon: Wyline Mood, MD;  Location: [facility] ENDOSCOPY;  Service: Endoscopy;  Laterality: N/A;  . LUMBAR FUSION  10/12/2009  . LUMBAR LAMINECTOMY  10/12/2009  . REPLACEMENT TOTAL KNEE Right 08/2013   Family History  Problem Relation Age of Onset  . Coronary artery disease Mother   . Congestive Heart Failure Father    Social History   Tobacco Use  . Smoking status: Former Smoker    Types: Cigarettes    Last attempt to quit: 09/20/1978    Years since quitting: 39.3  . Smokeless tobacco: Never Used  Substance Use Topics  . Alcohol use: Yes    Alcohol/week: 0.0 - 21.0 standard drinks  . Drug use: No    Interim medical history since last visit reviewed. Allergies and medications reviewed  Review of Systems  Constitutional: Negative for unexpected weight change.  Cardiovascular: Positive for leg swelling (when I'm full of fluid, they puff up; just when drinking a lot of beer and soda). Negative for chest pain and  palpitations.  Neurological: Positive for dizziness (when standing).   Per HPI unless specifically indicated above     Objective:    BP 122/74   Pulse 87   Temp 98.1 F (36.7 C) (Oral)   Ht 5\' 11"  (1.803 m)   Wt 201 lb 8 oz (91.4 kg)   SpO2 97%   BMI 28.10 kg/m   Wt Readings from Last 3 Encounters:  12/29/17 201 lb 8 oz (91.4 kg)  07/03/17 205 lb 4.8 oz (93.1 kg)  02/26/17 194 lb 8 oz (88.2 kg)    Physical Exam  Constitutional: He appears well-developed and well-nourished. No distress.  HENT:  Head: Normocephalic and atraumatic.  Eyes: EOM are normal. No scleral icterus.  Neck: No thyromegaly present.  Cardiovascular: Normal rate and regular rhythm.  Pulmonary/Chest: Effort normal and breath sounds normal.  Abdominal: Soft. Bowel sounds are normal. He exhibits no distension.  Musculoskeletal: He exhibits no edema.  Neurological: Coordination normal.  Skin: Skin is warm and dry. No pallor.  Psychiatric: He has a normal  mood and affect. His behavior is normal. Judgment and thought content normal.   Results for orders placed or performed in visit on 07/03/17  Uric acid  Result Value Ref Range   Uric Acid, Serum 3.5 (L) 4.0 - 8.0 mg/dL  Lipid panel  Result Value Ref Range   Cholesterol 109 <200 mg/dL   HDL 41 >46>40 mg/dL   Triglycerides 60 <962<150 mg/dL   LDL Cholesterol (Calc) 54 mg/dL (calc)   Total CHOL/HDL Ratio 2.7 <5.0 (calc)   Non-HDL Cholesterol (Calc) 68 <952<130 mg/dL (calc)  COMPLETE METABOLIC PANEL WITH GFR  Result Value Ref Range   Glucose, Bld 106 (H) 65 - 99 mg/dL   BUN 12 7 - 25 mg/dL   Creat 8.411.12 3.240.70 - 4.011.18 mg/dL   GFR, Est Non African American 66 > OR = 60 mL/min/1.3673m2   GFR, Est African American 77 > OR = 60 mL/min/1.7173m2   BUN/Creatinine Ratio NOT APPLICABLE 6 - 22 (calc)   Sodium 134 (L) 135 - 146 mmol/L   Potassium 3.3 (L) 3.5 - 5.3 mmol/L   Chloride 96 (L) 98 - 110 mmol/L   CO2 28 20 - 32 mmol/L   Calcium 9.8 8.6 - 10.3 mg/dL   Total Protein 6.9 6.1 - 8.1 g/dL   Albumin 4.0 3.6 - 5.1 g/dL   Globulin 2.9 1.9 - 3.7 g/dL (calc)   AG Ratio 1.4 1.0 - 2.5 (calc)   Total Bilirubin 1.2 0.2 - 1.2 mg/dL   Alkaline phosphatase (APISO) 51 40 - 115 U/L   AST 36 (H) 10 - 35 U/L   ALT 27 9 - 46 U/L  Hemoglobin A1c  Result Value Ref Range   Hgb A1c MFr Bld 5.5 <5.7 % of total Hgb   Mean Plasma Glucose 111 (calc)   eAG (mmol/L) 6.2 (calc)  CBC with Differential/Platelet  Result Value Ref Range   WBC 6.0 3.8 - 10.8 Thousand/uL   RBC 4.28 4.20 - 5.80 Million/uL   Hemoglobin 13.2 13.2 - 17.1 g/dL   HCT 02.737.7 (L) 25.338.5 - 66.450.0 %   MCV 88.1 80.0 - 100.0 fL   MCH 30.8 27.0 - 33.0 pg   MCHC 35.0 32.0 - 36.0 g/dL   RDW 40.313.6 47.411.0 - 25.915.0 %   Platelets 218 140 - 400 Thousand/uL   MPV 11.1 7.5 - 12.5 fL   Neutro Abs 3,936 1,500 - 7,800 cells/uL   Lymphs Abs 1,440 850 -  3,900 cells/uL   WBC mixed population 486 200 - 950 cells/uL   Eosinophils Absolute 120 15 - 500 cells/uL   Basophils Absolute  18 0 - 200 cells/uL   Neutrophils Relative % 65.6 %   Total Lymphocyte 24.0 %   Monocytes Relative 8.1 %   Eosinophils Relative 2.0 %   Basophils Relative 0.3 %      Assessment & Plan:   Problem List Items Addressed This Visit      Cardiovascular and Mediastinum   Benign essential HTN (Chronic)    Patient will decrease his BP pill by 50% for now; when he gets the new medicine (plain losartan), he take just the 50 mg losartan and we'll stop the HCTZ component completely; stay hydrated; close f/u in 2 days      Relevant Medications   losartan (COZAAR) 50 MG tablet     Genitourinary   Chronic kidney disease (CKD), stage II (mild)    Stop HCTZ        Other   Hyperlipidemia (Chronic)    Check lipids      Relevant Medications   losartan (COZAAR) 50 MG tablet   Other Relevant Orders   Lipid panel   Hyperglycemia    Check glucose and A1c      Relevant Orders   Hemoglobin A1c   Gout    Stop HCTZ as soon as the new medicine (plain losartan) arrives from mail order; he did not want to get locally, but agreed to cut the HCTZ by 50% until new plain ARB comes in and he'll take that and no HCTZ      Alcohol abuse    Encouraged abstinence; offered alcohol counseling and he politely declined; explained that the alcohol intake is strongly correlated with gout      Relevant Orders   COMPLETE METABOLIC PANEL WITH GFR    Other Visit Diagnoses    Postural dizziness with presyncope    -  Primary   Relevant Medications   losartan (COZAAR) 50 MG tablet   Other Relevant Orders   EKG 12-Lead   CBC with Differential/Platelet   Hypokalemia       Relevant Orders   Magnesium   Weight loss       Relevant Orders   TSH   Orthostatic hypotension       cut ARB-thiazide combo by 50%; patient does not want new med to local pharmacy; hydrate, close f/u   Relevant Medications   losartan (COZAAR) 50 MG tablet       Follow up plan: Return in about 2 days (around 12/31/2017) for blood  pressure recheck with CMA.  An after-visit summary was printed and given to the patient at check-out.  Please see the patient instructions which may contain other information and recommendations beyond what is mentioned above in the assessment and plan.  Meds ordered this encounter  Medications  . DISCONTD: losartan (COZAAR) 100 MG tablet    Sig: Take 1 tablet (100 mg total) by mouth daily. Do NOT take with the other losartan-hctz    Dispense:  90 tablet    Refill:  0    Stop the HCTZ component; please send right away  . losartan (COZAAR) 50 MG tablet    Sig: Take 1 tablet (50 mg total) by mouth daily. For blood pressure; STOP the one with HCTZ in it    Dispense:  90 tablet    Refill:  0    Use THIS one instead; do NOT  fill the 100 mg strength just sent today    Orders Placed This Encounter  Procedures  . Lipid panel  . Hemoglobin A1c  . COMPLETE METABOLIC PANEL WITH GFR  . CBC with Differential/Platelet  . Magnesium  . TSH  . EKG 12-Lead    EKG: NSR, no ST-T wave changes

## 2017-12-29 NOTE — Assessment & Plan Note (Signed)
Encouraged abstinence; offered alcohol counseling and he politely declined; explained that the alcohol intake is strongly correlated with gout

## 2017-12-29 NOTE — Assessment & Plan Note (Signed)
Check glucose and A1c 

## 2017-12-29 NOTE — Patient Instructions (Addendum)
Until you get the new losartan (plain) 50 mg, here's what I want you to do with the current losartan-HCTZ:  Take just half of a pill of the 100/25 losartan-HCTZ once a day (cut that in half)  That is just until you get the new plain losartan from the mail order  Once you get new medicine (plain losartan 50 mg) from mail order, start that and STOP the half pill of losartan-HCTZ 100/25  Do NOT take both at the same time  Return in 2 days for a recheck with the certified medical assistant (orthostatic vitals)  Hydrate really well  Avoid all alcohol  If you feel worse or something worrisome happens, call 911 or get to the ER

## 2017-12-29 NOTE — Assessment & Plan Note (Addendum)
Patient will decrease his BP pill by 50% for now; when he gets the new medicine (plain losartan), he take just the 50 mg losartan and we'll stop the HCTZ component completely; stay hydrated; close f/u in 2 days

## 2017-12-29 NOTE — Assessment & Plan Note (Signed)
Check lipids 

## 2017-12-29 NOTE — Assessment & Plan Note (Signed)
Stop HCTZ

## 2017-12-30 ENCOUNTER — Telehealth: Payer: Self-pay

## 2017-12-30 DIAGNOSIS — D649 Anemia, unspecified: Secondary | ICD-10-CM

## 2017-12-30 LAB — CBC WITH DIFFERENTIAL/PLATELET
BASOS ABS: 27 {cells}/uL (ref 0–200)
Basophils Relative: 0.3 %
EOS PCT: 1.4 %
Eosinophils Absolute: 127 cells/uL (ref 15–500)
HEMATOCRIT: 35.2 % — AB (ref 38.5–50.0)
HEMOGLOBIN: 12.4 g/dL — AB (ref 13.2–17.1)
LYMPHS ABS: 1674 {cells}/uL (ref 850–3900)
MCH: 30.4 pg (ref 27.0–33.0)
MCHC: 35.2 g/dL (ref 32.0–36.0)
MCV: 86.3 fL (ref 80.0–100.0)
MPV: 11.2 fL (ref 7.5–12.5)
Monocytes Relative: 7.1 %
NEUTROS ABS: 6625 {cells}/uL (ref 1500–7800)
Neutrophils Relative %: 72.8 %
Platelets: 253 10*3/uL (ref 140–400)
RBC: 4.08 10*6/uL — ABNORMAL LOW (ref 4.20–5.80)
RDW: 12.8 % (ref 11.0–15.0)
Total Lymphocyte: 18.4 %
WBC mixed population: 646 cells/uL (ref 200–950)
WBC: 9.1 10*3/uL (ref 3.8–10.8)

## 2017-12-30 LAB — IRON,TIBC AND FERRITIN PANEL
%SAT: 40 % (calc) (ref 20–48)
Ferritin: 949 ng/mL — ABNORMAL HIGH (ref 24–380)
Iron: 100 ug/dL (ref 50–180)
TIBC: 251 ug/dL (ref 250–425)

## 2017-12-30 LAB — COMPLETE METABOLIC PANEL WITH GFR
AG RATIO: 1.3 (calc) (ref 1.0–2.5)
ALBUMIN MSPROF: 3.8 g/dL (ref 3.6–5.1)
ALT: 15 U/L (ref 9–46)
AST: 21 U/L (ref 10–35)
Alkaline phosphatase (APISO): 52 U/L (ref 40–115)
BILIRUBIN TOTAL: 0.6 mg/dL (ref 0.2–1.2)
BUN: 8 mg/dL (ref 7–25)
CALCIUM: 9.1 mg/dL (ref 8.6–10.3)
CHLORIDE: 96 mmol/L — AB (ref 98–110)
CO2: 29 mmol/L (ref 20–32)
Creat: 1.03 mg/dL (ref 0.70–1.18)
GFR, EST AFRICAN AMERICAN: 85 mL/min/{1.73_m2} (ref >=60)
GFR, Est Non African American: 73 mL/min/{1.73_m2} (ref >=60)
Globulin: 2.9 g/dL (calc) (ref 1.9–3.7)
Glucose, Bld: 94 mg/dL (ref 65–139)
POTASSIUM: 3.5 mmol/L (ref 3.5–5.3)
Sodium: 133 mmol/L — ABNORMAL LOW (ref 135–146)
TOTAL PROTEIN: 6.7 g/dL (ref 6.1–8.1)

## 2017-12-30 LAB — LIPID PANEL
Cholesterol: 105 mg/dL (ref <200)
HDL: 34 mg/dL — ABNORMAL LOW (ref >40)
LDL Cholesterol (Calc): 58 mg/dL (calc)
Non-HDL Cholesterol (Calc): 71 mg/dL (calc) (ref <130)
Total CHOL/HDL Ratio: 3.1 (calc) (ref <5.0)
Triglycerides: 53 mg/dL (ref <150)

## 2017-12-30 LAB — HEMOGLOBIN A1C
HEMOGLOBIN A1C: 5.6 %{Hb} (ref <5.7)
Mean Plasma Glucose: 114 (calc)
eAG (mmol/L): 6.3 (calc)

## 2017-12-30 LAB — TEST AUTHORIZATION

## 2017-12-30 LAB — MAGNESIUM: Magnesium: 1.2 mg/dL — ABNORMAL LOW (ref 1.5–2.5)

## 2017-12-30 LAB — TSH: TSH: 1.9 m[IU]/L (ref 0.40–4.50)

## 2017-12-30 NOTE — Telephone Encounter (Signed)
-----   Message from Kerman PasseyMelinda P Lada, MD sent at 12/30/2017 12:51 PM EDT ----- Please let pt know that his magnesium is quite low Start magnesium oxide 400 mg and take one three times a day for one week, then just one a day His HDL is low, so try to walk more when he's feeling steadier and not having low blood pressures His 3 month blood sugar average is fine His sodium and chloride levels are low, so I am glad we are stopping the HCTZ; it's been low for a while, which can also reflect excessive drinking His potassium is normal, but at the lower end of normal, so try to eat more foods with potassium in them He has mild anemia, so ADD ON B12, folic acid, ferritin, TIBC, iron; dx: anemia

## 2017-12-31 ENCOUNTER — Ambulatory Visit: Payer: Medicare HMO

## 2017-12-31 ENCOUNTER — Telehealth: Payer: Self-pay

## 2017-12-31 ENCOUNTER — Encounter: Payer: Self-pay | Admitting: Family Medicine

## 2017-12-31 VITALS — BP 98/62 | HR 79

## 2017-12-31 DIAGNOSIS — R7989 Other specified abnormal findings of blood chemistry: Secondary | ICD-10-CM | POA: Insufficient documentation

## 2017-12-31 DIAGNOSIS — I10 Essential (primary) hypertension: Secondary | ICD-10-CM

## 2017-12-31 NOTE — Progress Notes (Signed)
Pt here for blood pressure check, 2 day follow-up since cutting the Losartan/HCTZ in half.  He is waiting on new med losartan to come from mail order.  Pt denies any side effects and blood pressure today is 98/62 and pulse 79.  Consulted with Dr. Sherie DonLada and we will discontinue all current blood pressure meds and recheck bp in 1 day.  Pt was told to please hydrate.  Dr. Sherie DonLada actually consulted with patient and decided to do orthostatics.  Will continue current regimen as described above.  If blood pressure is still low tomorrow repeat orthostatics and consult with Irving BurtonEmily NP.

## 2017-12-31 NOTE — Telephone Encounter (Signed)
-----   Message from Kerman PasseyMelinda P Lada, MD sent at 12/31/2017  2:53 PM EDT ----- Please let the pt know that his ferritin level is really high; please REFER to hematologist for evaluation

## 2017-12-31 NOTE — Patient Instructions (Addendum)
Blood pressure is still low, so please stop blood pressure med Losartan/HCTZ and do not take Losartan when it comes in either.  Follow up in 1 day for recheck blood pressure make sure you are hydrating very well with water.

## 2018-01-01 ENCOUNTER — Ambulatory Visit: Payer: Medicare HMO

## 2018-01-01 VITALS — BP 130/72

## 2018-01-01 DIAGNOSIS — I10 Essential (primary) hypertension: Secondary | ICD-10-CM

## 2018-01-01 NOTE — Progress Notes (Signed)
Patient here for repeat blood pressure check today.  He was here yesterday and Dr. Sherie DonLada discontinued all blood pressure medication and told him to follow up today and to be well hydrated.  Blood pressure today is 130/72 and pulse 82.  I consulted with Irving BurtonEmily NP and we told him continue to not take medication and if he had any side effects to go the ED

## 2018-01-03 NOTE — Progress Notes (Signed)
[facility]  Telephone:([phone] Fax:(336) [phone]  ID: RUFFIN LADA OB: January 01, 1946  MR#: [id number]  ION#:[id number]  Patient Care Team: Kerman Passey, MD as PCP - General (Family Medicine)  CHIEF COMPLAINT: Elevated ferritin.  INTERVAL HISTORY: Patient is a 72 year old male who was noted to have a significantly elevated ferritin on routine blood work.  He currently feels well and is asymptomatic.  He has no neurologic complaints.  He denies any recent fevers or illnesses.  He has a good appetite and denies weight loss.  He has no chest pain or shortness of breath.  He denies any nausea, vomiting, constipation, or diarrhea.  He has no urinary complaints.  Patient feels at his baseline offers no specific complaints today.  REVIEW OF SYSTEMS:   Review of Systems  Constitutional: Negative.  Negative for fever, malaise/fatigue and weight loss.  Respiratory: Negative.  Negative for cough, hemoptysis and shortness of breath.   Cardiovascular: Negative.  Negative for chest pain and leg swelling.  Gastrointestinal: Negative.  Negative for abdominal pain.  Genitourinary: Negative.  Negative for dysuria and hematuria.  Musculoskeletal: Negative.  Negative for back pain.  Skin: Negative.  Negative for rash.  Neurological: Negative.  Negative for dizziness, focal weakness, weakness and headaches.  Psychiatric/Behavioral: Negative.  The patient is not nervous/anxious.     As per HPI. Otherwise, a complete review of systems is negative.  PAST MEDICAL HISTORY: Past Medical History:  Diagnosis Date  . Gout   . Hyperlipidemia   . Hypertension   . Thyroid disease     PAST SURGICAL HISTORY: Past Surgical History:  Procedure Laterality Date  . COLONOSCOPY WITH PROPOFOL N/A 10/10/2016   Procedure: COLONOSCOPY WITH PROPOFOL;  Surgeon: Wyline Mood, MD;  Location: [facility] ENDOSCOPY;  Service: Endoscopy;  Laterality: N/A;  . LUMBAR FUSION  10/12/2009  . LUMBAR LAMINECTOMY   10/12/2009  . REPLACEMENT TOTAL KNEE Right 08/2013    FAMILY HISTORY: Family History  Problem Relation Age of Onset  . Coronary artery disease Mother   . Congestive Heart Failure Father   . Coronary artery disease Brother   . COPD Sister   . Breast cancer Sister 52    ADVANCED DIRECTIVES (Y/N):  N  HEALTH MAINTENANCE: Social History   Tobacco Use  . Smoking status: Former Smoker    Types: Cigarettes    Last attempt to quit: 09/20/1978    Years since quitting: 39.3  . Smokeless tobacco: Never Used  Substance Use Topics  . Alcohol use: Yes    Alcohol/week: 0.0 - 21.0 standard drinks    Comment: " i drink when I want to drink' stated pt  . Drug use: No     Colonoscopy:  PAP:  Bone density:  Lipid panel:  Allergies  Allergen Reactions  . Fluzone [Flu Virus Vaccine]     No current outpatient medications on file.   No current facility-administered medications for this visit.     OBJECTIVE: Vitals:   01/06/18 1352  BP: (!) 146/87  Pulse: 73  Resp: 18  Temp: (!) 97.2 F (36.2 C)     Body mass index is 27.75 kg/m.    ECOG FS:0 - Asymptomatic  General: Well-developed, well-nourished, no acute distress. Eyes: Pink conjunctiva, anicteric sclera. HEENT: Normocephalic, moist mucous membranes, clear oropharnyx. Lungs: Clear to auscultation bilaterally. Heart: Regular rate and rhythm. No rubs, murmurs, or gallops. Abdomen: Soft, nontender, nondistended. No organomegaly noted, normoactive bowel sounds. Musculoskeletal: No edema, cyanosis, or clubbing. Neuro: Alert, answering  all questions appropriately. Cranial nerves grossly intact. Skin: No rashes or petechiae noted. Psych: Normal affect. Lymphatics: No cervical, calvicular, axillary or inguinal LAD.   LAB RESULTS:  Lab Results  Component Value Date   NA 133 (L) 12/29/2017   K 3.5 12/29/2017   CL 96 (L) 12/29/2017   CO2 29 12/29/2017   GLUCOSE 94 12/29/2017   BUN 8 12/29/2017   CREATININE 1.03  12/29/2017   CALCIUM 9.1 12/29/2017   PROT 6.7 12/29/2017   ALBUMIN 3.8 05/12/2016   AST 21 12/29/2017   ALT 15 12/29/2017   ALKPHOS 48 05/12/2016   BILITOT 0.6 12/29/2017   GFRNONAA 73 12/29/2017   GFRAA 85 12/29/2017    Lab Results  Component Value Date   WBC 6.4 01/06/2018   NEUTROABS 4.2 01/06/2018   HGB 12.4 (L) 01/06/2018   HCT 37.1 (L) 01/06/2018   MCV 93.5 01/06/2018   PLT 254 01/06/2018   Lab Results  Component Value Date   IRON 110 01/06/2018   TIBC 264 01/06/2018   IRONPCTSAT 42 (H) 01/06/2018   Lab Results  Component Value Date   FERRITIN 152 01/06/2018     STUDIES: No results found.  ASSESSMENT: Elevated ferritin  PLAN:    1. Elevated ferritin: Resolved.  Patient's ferritin level is now within normal limits at 152.  Unclear etiology of patient's increase, but may be secondary to acute phase reactant.  He does have a mildly increased iron saturation and will repeat this lab at next clinic visit.  Hemochromatosis testing is pending at time of dictation.  No intervention is needed.  Return to clinic in 3 weeks with repeat laboratory work and further evaluation.  If his ferritin remains within normal limits at that time, patient likely can be discharged from clinic. 2.  Hypomagnesia: Unclear etiology.  Will recommend oral magnesium supplementation.  I spent a total of 45 minutes face-to-face with the patient of which greater than 50% of the visit was spent in counseling and coordination of care as detailed above.   Patient expressed understanding and was in agreement with this plan. He also understands that He can call clinic at any time with any questions, concerns, or complaints.   Cancer Staging No matching staging information was found for the patient.  Jeralyn Ruthsimothy J Finnegan, MD   01/10/2018 8:07 AM

## 2018-01-06 ENCOUNTER — Inpatient Hospital Stay: Payer: Medicare HMO | Attending: Oncology | Admitting: Oncology

## 2018-01-06 ENCOUNTER — Other Ambulatory Visit: Payer: Self-pay

## 2018-01-06 ENCOUNTER — Inpatient Hospital Stay: Payer: Medicare HMO

## 2018-01-06 ENCOUNTER — Encounter: Payer: Self-pay | Admitting: Oncology

## 2018-01-06 VITALS — BP 146/87 | HR 73 | Temp 97.2°F | Resp 18 | Ht 70.87 in | Wt 198.2 lb

## 2018-01-06 DIAGNOSIS — Z87891 Personal history of nicotine dependence: Secondary | ICD-10-CM | POA: Insufficient documentation

## 2018-01-06 DIAGNOSIS — R7989 Other specified abnormal findings of blood chemistry: Secondary | ICD-10-CM

## 2018-01-06 LAB — CBC WITH DIFFERENTIAL/PLATELET
BASOS PCT: 1 %
Basophils Absolute: 0.1 10*3/uL (ref 0–0.1)
Eosinophils Absolute: 0.2 10*3/uL (ref 0–0.7)
Eosinophils Relative: 3 %
HEMATOCRIT: 37.1 % — AB (ref 40.0–52.0)
HEMOGLOBIN: 12.4 g/dL — AB (ref 13.0–18.0)
Lymphocytes Relative: 23 %
Lymphs Abs: 1.5 10*3/uL (ref 1.0–3.6)
MCH: 31.2 pg (ref 26.0–34.0)
MCHC: 33.4 g/dL (ref 32.0–36.0)
MCV: 93.5 fL (ref 80.0–100.0)
MONOS PCT: 9 %
Monocytes Absolute: 0.6 10*3/uL (ref 0.2–1.0)
NEUTROS ABS: 4.2 10*3/uL (ref 1.4–6.5)
NEUTROS PCT: 64 %
Platelets: 254 10*3/uL (ref 150–440)
RBC: 3.97 MIL/uL — AB (ref 4.40–5.90)
RDW: 14.9 % — ABNORMAL HIGH (ref 11.5–14.5)
WBC: 6.4 10*3/uL (ref 3.8–10.6)

## 2018-01-06 LAB — FERRITIN: Ferritin: 152 ng/mL (ref 24–336)

## 2018-01-06 LAB — MAGNESIUM: MAGNESIUM: 1.3 mg/dL — AB (ref 1.7–2.4)

## 2018-01-06 LAB — IRON AND TIBC
Iron: 110 ug/dL (ref 45–182)
Saturation Ratios: 42 % — ABNORMAL HIGH (ref 17.9–39.5)
TIBC: 264 ug/dL (ref 250–450)
UIBC: 154 ug/dL

## 2018-01-06 NOTE — Progress Notes (Signed)
Here for new pt evaluation.  

## 2018-01-07 LAB — AFP TUMOR MARKER: AFP, Serum, Tumor Marker: 1.1 ng/mL (ref 0.0–8.3)

## 2018-01-11 LAB — HEMOCHROMATOSIS DNA-PCR(C282Y,H63D)

## 2018-01-22 ENCOUNTER — Ambulatory Visit
Admission: RE | Admit: 2018-01-22 | Discharge: 2018-01-22 | Disposition: A | Discharge status: Home / Self Care | Payer: Medicare HMO | Source: Ambulatory Visit | Attending: Family Medicine | Admitting: Family Medicine

## 2018-01-22 ENCOUNTER — Encounter: Payer: Self-pay | Admitting: Family Medicine

## 2018-01-22 ENCOUNTER — Ambulatory Visit (INDEPENDENT_AMBULATORY_CARE_PROVIDER_SITE_OTHER): Payer: Medicare HMO | Admitting: Family Medicine

## 2018-01-22 VITALS — BP 136/80 | HR 72 | Temp 98.0°F | Ht 71.0 in | Wt 208.3 lb

## 2018-01-22 DIAGNOSIS — G939 Disorder of brain, unspecified: Secondary | ICD-10-CM | POA: Insufficient documentation

## 2018-01-22 DIAGNOSIS — I10 Essential (primary) hypertension: Secondary | ICD-10-CM

## 2018-01-22 DIAGNOSIS — M1A0710 Idiopathic chronic gout, right ankle and foot, without tophus (tophi): Secondary | ICD-10-CM

## 2018-01-22 DIAGNOSIS — F1010 Alcohol abuse, uncomplicated: Secondary | ICD-10-CM

## 2018-01-22 DIAGNOSIS — G3281 Cerebellar ataxia in diseases classified elsewhere: Secondary | ICD-10-CM

## 2018-01-22 DIAGNOSIS — R790 Abnormal level of blood mineral: Secondary | ICD-10-CM

## 2018-01-22 DIAGNOSIS — R2681 Unsteadiness on feet: Secondary | ICD-10-CM | POA: Diagnosis not present

## 2018-01-22 DIAGNOSIS — N182 Chronic kidney disease, stage 2 (mild): Secondary | ICD-10-CM | POA: Diagnosis not present

## 2018-01-22 DIAGNOSIS — R7989 Other specified abnormal findings of blood chemistry: Secondary | ICD-10-CM

## 2018-01-22 DIAGNOSIS — R531 Weakness: Secondary | ICD-10-CM | POA: Diagnosis not present

## 2018-01-22 MED ORDER — LOSARTAN POTASSIUM 50 MG PO TABS: 50.0000 mg | ORAL_TABLET | Freq: Every day | ORAL | Status: DC

## 2018-01-22 MED ORDER — ALLOPURINOL 100 MG PO TABS: 100.0000 mg | ORAL_TABLET | Freq: Every day | ORAL | 6 refills | Status: DC

## 2018-01-22 MED ORDER — MAGNESIUM OXIDE 400 MG PO TABS: ORAL_TABLET | ORAL | Status: DC

## 2018-01-22 MED ORDER — OMEGA 3 1000 MG PO CAPS: 1.0000 | ORAL_CAPSULE | Freq: Two times a day (BID) | ORAL | Status: DC

## 2018-01-22 NOTE — Assessment & Plan Note (Signed)
Taper back up on the allopurinol, 100 mg daily x 2 weeks, then 200 mg daily x 2 weeks, then 300 mg daily

## 2018-01-22 NOTE — Assessment & Plan Note (Signed)
Patient reports no EtOH in 6-7 weeks

## 2018-01-22 NOTE — Assessment & Plan Note (Signed)
Start back on 50 mg losartan; return in one week

## 2018-01-22 NOTE — Assessment & Plan Note (Signed)
Start back on losartan

## 2018-01-22 NOTE — Patient Instructions (Addendum)
Do start back on allopurinol as follows: 100 mg daily for 2 weeks (one of the 100 mg pills sent to local pharmacy) 200 mg daily for 2 weeks (two of the 100 mg pills sent to local pharmacy) 300 mg daily (those are the pills you have at home)  Start back on losartan 50 mg daily  DO NOT TAKE HCTZ  Start magnesium oxide and take one pill three times a day for three days (Friday, Saturday, and Sunday), then just once a day  We'll have you see the neurologist  Don't drive if you feel unsteady or drowsy or unable to operate a car  We'll get a brain MRI

## 2018-01-22 NOTE — Assessment & Plan Note (Signed)
Managed by hematologist 

## 2018-01-22 NOTE — Progress Notes (Signed)
BP 136/80   Pulse 72   Temp 98 F (36.7 C)   Ht 5\' 11"  (1.803 m)   Wt 208 lb 4.8 oz (94.5 kg)   SpO2 99%   BMI 29.05 kg/m    Subjective:    Patient ID: Lonnie Huber, male    DOB: [birth year], 72 y.o.   MRN: [id number]  HPI: Lonnie Huber is a 72 y.o. male  Chief Complaint  Patient presents with  . Follow-up    HPI Patient is here for f/u He was seen on Sept 17th; sounded like he was having panic attacks, then sounded more like postural hypotension; still feels staggery; not drinking, last alcohol was 6-7 weeks ago; brother has had 2-3 mini-strokes and cousin had 2-3 min-strokes; patient says the unsteady gait, staggering has been going on a good long while; brother had really high blood pressure; years ago, he took aspirin, but they took him off; no hx of stomach ulcer  He came back to see the CMA for a few blood pressure checks after adjusting (reducing) his BP medicine; HCTZ was stopped; last visit, BP was 130/72  He went to see Dr. Orlie Dakin for high serum ferritin, Sept. 25th; they took six tubes of blood; goes back to see him soon; ferritin was 949 when I checked it and only 152 when Dr. Orlie Dakin checked it; go back soon; no belly pain; magnesium was really low  High cholesterol; someone told him to stop this (it was not me); removed from med list by someone at the cancer center; has the cholesterol medicine at the house and can start back; no problems with the medicine; he will start back on the fish oil too  Weight gain due to drinking a bunch of ginger ale; he is also drinking plenty of water; no SHOB; no leg edema; no orthopnea  Mild anemia over at hematologist's office; he goes back the 16th  Depression screen [facility] 2/9 01/22/2018 12/29/2017 07/03/2017 02/26/2017 08/19/2016  Decreased Interest 0 0 0 0 0  Down, Depressed, Hopeless 0 1 0 0 0  PHQ - 2 Score 0 1 0 0 0  Altered sleeping 0 0 - - -  Tired, decreased energy 0 1 - - -  Change in appetite 0 0 - - -  Feeling  bad or failure about yourself  0 0 - - -  Trouble concentrating 0 0 - - -  Moving slowly or fidgety/restless 0 0 - - -  Suicidal thoughts 0 0 - - -  PHQ-9 Score 0 2 - - -   Fall Risk  01/22/2018 12/29/2017 07/03/2017 02/26/2017 08/19/2016  Falls in the past year? No Yes No No No  Number falls in past yr: - 2 or more - - -  Injury with Fall? - No - - -    Relevant past medical, surgical, family and social history reviewed Past Medical History:  Diagnosis Date  . Gout   . Hyperlipidemia   . Hypertension   . Thyroid disease    Past Surgical History:  Procedure Laterality Date  . COLONOSCOPY WITH PROPOFOL N/A 10/10/2016   Procedure: COLONOSCOPY WITH PROPOFOL;  Surgeon: Wyline Mood, MD;  Location: [facility] ENDOSCOPY;  Service: Endoscopy;  Laterality: N/A;  . LUMBAR FUSION  10/12/2009  . LUMBAR LAMINECTOMY  10/12/2009  . REPLACEMENT TOTAL KNEE Right 08/2013   Family History  Problem Relation Age of Onset  . Coronary artery disease Mother   . Congestive Heart Failure Father   .  Coronary artery disease Brother   . COPD Sister   . Breast cancer Sister 35   Social History   Tobacco Use  . Smoking status: Former Smoker    Types: Cigarettes    Last attempt to quit: 09/20/1978    Years since quitting: 39.3  . Smokeless tobacco: Never Used  Substance Use Topics  . Alcohol use: Yes    Alcohol/week: 0.0 - 21.0 standard drinks    Comment: " i drink when I want to drink' stated pt  . Drug use: No     [facility] Visit from 01/22/2018 in [facility]  AUDIT-C Score  0      Interim medical history since last visit reviewed. Allergies and medications reviewed  Review of Systems Per HPI unless specifically indicated above     Objective:    BP 136/80   Pulse 72   Temp 98 F (36.7 C)   Ht 5\' 11"  (1.803 m)   Wt 208 lb 4.8 oz (94.5 kg)   SpO2 99%   BMI 29.05 kg/m   Wt Readings from Last 3 Encounters:  01/22/18 208 lb 4.8 oz (94.5 kg)  01/06/18 198 lb 3.2 oz (89.9  kg)  12/29/17 201 lb 8 oz (91.4 kg)    Physical Exam  Constitutional: He is oriented to person, place, and time. He appears well-developed and well-nourished. No distress.  HENT:  Head: Normocephalic and atraumatic.  Eyes: EOM are normal. No scleral icterus.  Neck: No thyromegaly present.  Cardiovascular: Normal rate and regular rhythm.  Pulmonary/Chest: Effort normal and breath sounds normal.  Abdominal: Soft. Bowel sounds are normal. He exhibits no distension.  Musculoskeletal: He exhibits no edema.  Neurological: He is alert and oriented to person, place, and time. He has normal strength. He displays no atrophy and no tremor. No cranial nerve deficit. He exhibits normal muscle tone. He displays a negative Romberg sign. He displays no seizure activity. Gait (ataxic; rather wide-spaced stance) abnormal. Coordination normal.  Skin: Skin is warm and dry. No pallor.  Psychiatric: He has a normal mood and affect. His behavior is normal. Judgment and thought content normal.    Results for orders placed or performed in visit on 01/06/18  Hemochromatosis DNA-PCR(c282y,h63d)  Result Value Ref Range   DNA Mutation Analysis Comment   Iron and TIBC  Result Value Ref Range   Iron 110 45 - 182 ug/dL   TIBC 161 096 - 045 ug/dL   Saturation Ratios 42 (H) 17.9 - 39.5 %   UIBC 154 ug/dL  Ferritin  Result Value Ref Range   Ferritin 152 24 - 336 ng/mL  CBC with Differential/Platelet  Result Value Ref Range   WBC 6.4 3.8 - 10.6 K/uL   RBC 3.97 (L) 4.40 - 5.90 MIL/uL   Hemoglobin 12.4 (L) 13.0 - 18.0 g/dL   HCT 40.9 (L) 81.1 - 91.4 %   MCV 93.5 80.0 - 100.0 fL   MCH 31.2 26.0 - 34.0 pg   MCHC 33.4 32.0 - 36.0 g/dL   RDW 78.2 (H) 95.6 - 21.3 %   Platelets 254 150 - 440 K/uL   Neutrophils Relative % 64 %   Neutro Abs 4.2 1.4 - 6.5 K/uL   Lymphocytes Relative 23 %   Lymphs Abs 1.5 1.0 - 3.6 K/uL   Monocytes Relative 9 %   Monocytes Absolute 0.6 0.2 - 1.0 K/uL   Eosinophils Relative 3 %    Eosinophils Absolute 0.2 0 - 0.7 K/uL  Basophils Relative 1 %   Basophils Absolute 0.1 0 - 0.1 K/uL  AFP tumor marker  Result Value Ref Range   AFP, Serum, Tumor Marker 1.1 0.0 - 8.3 ng/mL  Magnesium  Result Value Ref Range   Magnesium 1.3 (L) 1.7 - 2.4 mg/dL      Assessment & Plan:   Problem List Items Addressed This Visit      Cardiovascular and Mediastinum   Benign essential HTN (Chronic)    Start back on 50 mg losartan; return in one week      Relevant Medications   atorvastatin (LIPITOR) 40 MG tablet   losartan (COZAAR) 50 MG tablet     Genitourinary   Chronic kidney disease (CKD), stage II (mild)    Start back on losartan      Relevant Orders   Basic metabolic panel     Other   High serum ferritin    Managed by hematologist      Gout    Taper back up on the allopurinol, 100 mg daily x 2 weeks, then 200 mg daily x 2 weeks, then 300 mg daily      Alcohol abuse    Patient reports no EtOH in 6-7 weeks       Other Visit Diagnoses    Unsteady gait    -  Primary   stat brain MRI; refer to neurologist; call 911 if new symptoms   Relevant Orders   Ambulatory referral to Neurology   Cerebellar ataxia in diseases classified elsewhere [facility])       refer to neuro; stat brain MRI   Relevant Orders   MR Brain Wo Contrast   Low blood magnesium       level was low at hematologist's office; patient is not taking any Mg; orders given for him to start OTC and will see if hematologist recs IV   Relevant Orders   Magnesium       Follow up plan: Return in about 1 week (around 01/29/2018) for follow-up visit with Dr. Sherie Don or Sharyon Cable, DNP.  An after-visit summary was printed and given to the patient at check-out.  Please see the patient instructions which may contain other information and recommendations beyond what is mentioned above in the assessment and plan.  Meds ordered this encounter  Medications  . allopurinol (ZYLOPRIM) 100 MG tablet    Sig: Take  1 tablet (100 mg total) by mouth daily. For 2 weeks, then two by mouth daily    Dispense:  46 tablet    Refill:  6  . losartan (COZAAR) 50 MG tablet    Sig: Take 1 tablet (50 mg total) by mouth daily. For blood pressure; STOP the one with HCTZ in it    Use THIS one instead; do NOT fill the 100 mg strength just sent today  . DISCONTD: magnesium oxide (MAG-OX) 400 MG tablet    Sig: One by mouth three days a day Friday (10/11) and Saturday(10/12) and Sunday (10/13), then one twice a day  . Omega 3 1000 MG CAPS    Sig: Take 1 capsule (1,000 mg total) by mouth 2 (two) times daily.  . magnesium oxide (MAG-OX) 400 MG tablet    Sig: One by mouth three days a day Friday (10/11) and Saturday(10/12) and Sunday (10/13), then one a day    Orders Placed This Encounter  Procedures  . MR Brain Wo Contrast  . Basic metabolic panel  . Magnesium  . Ambulatory referral to  Neurology

## 2018-01-23 LAB — BASIC METABOLIC PANEL
BUN: 11 mg/dL (ref 7–25)
CALCIUM: 8.7 mg/dL (ref 8.6–10.3)
CO2: 28 mmol/L (ref 20–32)
CREATININE: 1.16 mg/dL (ref 0.70–1.18)
Chloride: 106 mmol/L (ref 98–110)
GLUCOSE: 91 mg/dL (ref 65–99)
Potassium: 3.7 mmol/L (ref 3.5–5.3)
SODIUM: 141 mmol/L (ref 135–146)

## 2018-01-23 LAB — MAGNESIUM: Magnesium: 1.3 mg/dL — ABNORMAL LOW (ref 1.5–2.5)

## 2018-01-24 NOTE — Progress Notes (Signed)
[facility]  Telephone:(336) [phone] Fax:(336) [phone]  ID: Lonnie Huber OB: 1945/09/06  MR#: [id number]  QVZ#:[id number]  Patient Care Team: Kerman Passey, MD as PCP - General (Family Medicine)  CHIEF COMPLAINT: Elevated ferritin, hypomagnesia.  INTERVAL HISTORY: Patient returns to clinic today for repeat laboratory work and further evaluation.  He continues to feel well and remains asymptomatic. He has no neurologic complaints.  He denies any recent fevers or illnesses.  He has a good appetite and denies weight loss.  He has no chest pain or shortness of breath.  He denies any nausea, vomiting, constipation, or diarrhea.  He has no urinary complaints.  Patient is at his baseline offers no specific complaints today.  REVIEW OF SYSTEMS:   Review of Systems  Constitutional: Negative.  Negative for fever, malaise/fatigue and weight loss.  Respiratory: Negative.  Negative for cough, hemoptysis and shortness of breath.   Cardiovascular: Negative.  Negative for chest pain and leg swelling.  Gastrointestinal: Negative.  Negative for abdominal pain.  Genitourinary: Negative.  Negative for dysuria and hematuria.  Musculoskeletal: Negative.  Negative for back pain.  Skin: Negative.  Negative for rash.  Neurological: Negative.  Negative for dizziness, focal weakness, weakness and headaches.  Psychiatric/Behavioral: Negative.  The patient is not nervous/anxious.     As per HPI. Otherwise, a complete review of systems is negative.  PAST MEDICAL HISTORY: Past Medical History:  Diagnosis Date  . Gout   . Hyperlipidemia   . Hypertension   . Thyroid disease     PAST SURGICAL HISTORY: Past Surgical History:  Procedure Laterality Date  . COLONOSCOPY WITH PROPOFOL N/A 10/10/2016   Procedure: COLONOSCOPY WITH PROPOFOL;  Surgeon: Wyline Mood, MD;  Location[facility] ENDOSCOPY;  Service: Endoscopy;  Laterality: N/A;  . LUMBAR FUSION  10/12/2009  . LUMBAR LAMINECTOMY   10/12/2009  . REPLACEMENT TOTAL KNEE Right 08/2013    FAMILY HISTORY: Family History  Problem Relation Age of Onset  . Coronary artery disease Mother   . Congestive Heart Failure Father   . Coronary artery disease Brother   . COPD Sister   . Breast cancer Sister 77    ADVANCED DIRECTIVES (Y/N):  N  HEALTH MAINTENANCE: Social History   Tobacco Use  . Smoking status: Former Smoker    Types: Cigarettes    Last attempt to quit: 09/20/1978    Years since quitting: 39.3  . Smokeless tobacco: Never Used  Substance Use Topics  . Alcohol use: Yes    Alcohol/week: 0.0 - 21.0 standard drinks    Comment: " i drink when I want to drink' stated pt  . Drug use: No     Colonoscopy:  PAP:  Bone density:  Lipid panel:  Allergies  Allergen Reactions  . Fluzone [Flu Virus Vaccine]     Current Outpatient Medications  Medication Sig Dispense Refill  . allopurinol (ZYLOPRIM) 100 MG tablet Take 1 tablet (100 mg total) by mouth daily. For 2 weeks, then two by mouth daily 46 tablet 6  . atorvastatin (LIPITOR) 40 MG tablet Take 1 tablet (40 mg total) by mouth daily.    Marland Kitchen losartan (COZAAR) 50 MG tablet Take 1 tablet (50 mg total) by mouth daily. For blood pressure; STOP the one with HCTZ in it    . magnesium oxide (MAG-OX) 400 MG tablet One by mouth three days a day Friday (10/11) and Saturday(10/12) and Sunday (10/13), then one a day    . Omega 3 1000 MG CAPS Take  1 capsule (1,000 mg total) by mouth 2 (two) times daily.     No current facility-administered medications for this visit.     OBJECTIVE: Vitals:   01/27/18 1042  BP: (!) 165/98  Pulse: 85  Resp: 18  Temp: (!) 95.8 F (35.4 C)     Body mass index is 28.87 kg/m.    ECOG FS:0 - Asymptomatic  General: Well-developed, well-nourished, no acute distress. Eyes: Pink conjunctiva, anicteric sclera. HEENT: Normocephalic, moist mucous membranes. Lungs: Clear to auscultation bilaterally. Heart: Regular rate and rhythm. No rubs,  murmurs, or gallops. Abdomen: Soft, nontender, nondistended. No organomegaly noted, normoactive bowel sounds. Musculoskeletal: No edema, cyanosis, or clubbing. Neuro: Alert, answering all questions appropriately. Cranial nerves grossly intact. Skin: No rashes or petechiae noted. Psych: Normal affect.  LAB RESULTS:  Lab Results  Component Value Date   NA 141 01/22/2018   K 3.7 01/22/2018   CL 106 01/22/2018   CO2 28 01/22/2018   GLUCOSE 91 01/22/2018   BUN 11 01/22/2018   CREATININE 1.16 01/22/2018   CALCIUM 8.7 01/22/2018   PROT 6.7 12/29/2017   ALBUMIN 3.8 05/12/2016   AST 21 12/29/2017   ALT 15 12/29/2017   ALKPHOS 48 05/12/2016   BILITOT 0.6 12/29/2017   GFRNONAA 73 12/29/2017   GFRAA 85 12/29/2017    Lab Results  Component Value Date   WBC 6.0 01/27/2018   NEUTROABS 3.9 01/27/2018   HGB 11.6 (L) 01/27/2018   HCT 36.7 (L) 01/27/2018   MCV 92.7 01/27/2018   PLT 203 01/27/2018   Lab Results  Component Value Date   IRON 78 01/27/2018   TIBC 240 (L) 01/27/2018   IRONPCTSAT 33 01/27/2018   Lab Results  Component Value Date   FERRITIN 146 01/27/2018     STUDIES: Mr Brain Wo Contrast  Result Date: 01/22/2018 CLINICAL DATA:  72 year old male with weakness, instability and loss of balance when walking for 1 month. EXAM: MRI HEAD WITHOUT CONTRAST TECHNIQUE: Multiplanar, multiecho pulse sequences of the brain and surrounding structures were obtained without intravenous contrast. COMPARISON:  Report of noncontrast head CT 05/11/2000 (no images available). FINDINGS: Brain: Cerebral volume is within normal limits for age. No restricted diffusion to suggest acute infarction. No midline shift, mass effect, evidence of mass lesion, ventriculomegaly, extra-axial collection or acute intracranial hemorrhage. Cervicomedullary junction and pituitary are within normal limits. No cortical encephalomalacia or chronic cerebral blood products. The cerebellum, brainstem, and deep gray  matter nuclei appear normal. Patchy and scattered cerebral white matter T2 and FLAIR hyperintensity is mild to moderate for age and in a nonspecific configuration. Vascular: Major intracranial vascular flow voids are preserved. The cavernous ICAs are tortuous. Skull and upper cervical spine: Negative visible cervical spine. Normal bone marrow signal. Sinuses/Orbits: Normal orbits soft tissues. Paranasal sinuses are clear. Other: Grossly normal visible internal auditory structures. Mastoid air cells are well pneumatized. Scalp and face soft tissues appear negative. IMPRESSION: 1.  No acute intracranial abnormality. 2. Mild to moderate for age nonspecific white matter signal changes, most commonly due to chronic small vessel disease. Electronically Signed   By: Odessa Fleming M.D.   On: 01/22/2018 15:18    ASSESSMENT: Elevated ferritin, hypomagnesia.  PLAN:    1. Elevated ferritin: Resolved.  Patient's ferritin level continues to be within normal limits at 146 today. Unclear etiology of patient's increase, but may be secondary to acute phase reactant.  Iron saturation and the remainder of his iron panel is also within normal limits.  He is  negative for the hemochromatosis gene mutation.  No intervention is needed at this time.  No further follow-up has been scheduled.  Please refer patient back if there are any questions or concerns. 2.  Hypomagnesia: Patient's magnesium is 1.3 today.  He received 4 g IV mag.  Recommended continuing oral magnesium supplementation.  Continue follow-up with primary care.   I spent a total of 30 minutes face-to-face with the patient of which greater than 50% of the visit was spent in counseling and coordination of care as detailed above.   Patient expressed understanding and was in agreement with this plan. He also understands that He can call clinic at any time with any questions, concerns, or complaints.    Jeralyn Ruths, MD   01/28/2018 4:46 PM

## 2018-01-27 ENCOUNTER — Inpatient Hospital Stay: Payer: Medicare HMO

## 2018-01-27 ENCOUNTER — Other Ambulatory Visit: Payer: Self-pay

## 2018-01-27 ENCOUNTER — Inpatient Hospital Stay: Payer: Medicare HMO | Attending: Oncology | Admitting: Oncology

## 2018-01-27 VITALS — BP 165/98 | HR 85 | Temp 95.8°F | Resp 18 | Wt 207.0 lb

## 2018-01-27 DIAGNOSIS — R7989 Other specified abnormal findings of blood chemistry: Secondary | ICD-10-CM | POA: Diagnosis not present

## 2018-01-27 DIAGNOSIS — Z79899 Other long term (current) drug therapy: Secondary | ICD-10-CM | POA: Diagnosis not present

## 2018-01-27 DIAGNOSIS — I10 Essential (primary) hypertension: Secondary | ICD-10-CM | POA: Insufficient documentation

## 2018-01-27 DIAGNOSIS — Z87891 Personal history of nicotine dependence: Secondary | ICD-10-CM | POA: Insufficient documentation

## 2018-01-27 LAB — CBC WITH DIFFERENTIAL/PLATELET
Abs Immature Granulocytes: 0.01 10*3/uL (ref 0.00–0.07)
BASOS PCT: 0 %
Basophils Absolute: 0 10*3/uL (ref 0.0–0.1)
EOS ABS: 0.1 10*3/uL (ref 0.0–0.5)
EOS PCT: 2 %
HEMATOCRIT: 36.7 % — AB (ref 39.0–52.0)
Hemoglobin: 11.6 g/dL — ABNORMAL LOW (ref 13.0–17.0)
Immature Granulocytes: 0 %
LYMPHS ABS: 1.6 10*3/uL (ref 0.7–4.0)
Lymphocytes Relative: 26 %
MCH: 29.3 pg (ref 26.0–34.0)
MCHC: 31.6 g/dL (ref 30.0–36.0)
MCV: 92.7 fL (ref 80.0–100.0)
MONOS PCT: 7 %
Monocytes Absolute: 0.4 10*3/uL (ref 0.1–1.0)
NEUTROS PCT: 65 %
Neutro Abs: 3.9 10*3/uL (ref 1.7–7.7)
PLATELETS: 203 10*3/uL (ref 150–400)
RBC: 3.96 MIL/uL — ABNORMAL LOW (ref 4.22–5.81)
RDW: 13.1 % (ref 11.5–15.5)
WBC: 6 10*3/uL (ref 4.0–10.5)
nRBC: 0 % (ref 0.0–0.2)

## 2018-01-27 LAB — IRON AND TIBC
Iron: 78 ug/dL (ref 45–182)
SATURATION RATIOS: 33 % (ref 17.9–39.5)
TIBC: 240 ug/dL — ABNORMAL LOW (ref 250–450)
UIBC: 162 ug/dL

## 2018-01-27 LAB — FERRITIN: Ferritin: 146 ng/mL (ref 24–336)

## 2018-01-27 MED ORDER — SODIUM CHLORIDE 0.9 % IV SOLN
INTRAVENOUS | Status: DC
  Administered 2018-01-27: 12:00:00 via INTRAVENOUS
  Filled 2018-01-27: qty 250

## 2018-01-27 MED ORDER — MAGNESIUM SULFATE 4 GM/100ML IV SOLN
4.0000 g | Freq: Once | INTRAVENOUS | Status: AC
  Administered 2018-01-27: 4 g via INTRAVENOUS
  Filled 2018-01-27: qty 100

## 2018-01-27 MED ORDER — SODIUM CHLORIDE 0.9 % IV SOLN: 4.0000 g | Freq: Once | INTRAVENOUS | Status: DC

## 2018-01-27 NOTE — Progress Notes (Signed)
Here for follow up. Per pt felt " light headed " this am....has felt this way for a couple of weeks he stated. Per pt not drinking enough water. In no acute distress now.

## 2018-01-29 ENCOUNTER — Encounter: Payer: Self-pay | Admitting: Nurse Practitioner

## 2018-01-29 ENCOUNTER — Ambulatory Visit (INDEPENDENT_AMBULATORY_CARE_PROVIDER_SITE_OTHER): Payer: Medicare HMO | Admitting: Nurse Practitioner

## 2018-01-29 VITALS — BP 130/80 | HR 83 | Temp 98.1°F | Resp 16 | Ht 71.0 in | Wt 207.6 lb

## 2018-01-29 DIAGNOSIS — Z87898 Personal history of other specified conditions: Secondary | ICD-10-CM

## 2018-01-29 DIAGNOSIS — R2681 Unsteadiness on feet: Secondary | ICD-10-CM | POA: Diagnosis not present

## 2018-01-29 DIAGNOSIS — Z9181 History of falling: Secondary | ICD-10-CM

## 2018-01-29 MED ORDER — VITAMIN B-1 100 MG PO TABS: 100.0000 mg | ORAL_TABLET | Freq: Every day | ORAL | 0 refills | Status: DC

## 2018-01-29 NOTE — Progress Notes (Signed)
Name: Lonnie Huber   MRN: [id number]    DOB: [birth year]   Date:01/29/2018       Progress Note  Subjective  Chief Complaint  Chief Complaint  Patient presents with  . Gait Problem    HPI  Patient was seen last week for staggering gait that has been ongoing for awhile. Had a history of alcohol abuse but had not drank in a couple of months.  MRI completed on 01/22/2018 IMPRESSION: 1.  No acute intracranial abnormality. 2. Mild to moderate for age nonspecific white matter signal changes, most commonly due to chronic small vessel disease.  Daily alcohol intake ongoing for years states never counted how much he was drinking on a daily basis; stopped 2 months ago and hasn't re-started yet  He has a bath tub shower has to step up into it, no grab bar in shower, states he got stuck in the shower one time because he was unable to lift his legs to get back out- states he reached over to the commode and had to pull himself out.    Patient Active Problem List   Diagnosis Date Noted  . High serum ferritin 12/31/2017  . Medication monitoring encounter 07/03/2017  . Hyperglycemia 11/28/2015  . Hyperlipidemia 04/24/2015  . Chronic kidney disease (CKD), stage II (mild) 09/20/2014  . Chronic LBP 09/20/2014  . Diverticulosis of colon 09/20/2014  . Failure of erection 09/20/2014  . Gout 09/20/2014  . Chronic pain of right knee 09/20/2014  . Alcohol abuse 09/20/2014  . Nondependent alcohol abuse, episodic drinking behavior 07/10/2008  . Decreased libido 10/20/2007  . Benign essential HTN 06/18/2007  . Combined fat and carbohydrate induced hyperlipemia 06/18/2007    Past Medical History:  Diagnosis Date  . Gout   . Hyperlipidemia   . Hypertension   . Thyroid disease     Past Surgical History:  Procedure Laterality Date  . COLONOSCOPY WITH PROPOFOL N/A 10/10/2016   Procedure: COLONOSCOPY WITH PROPOFOL;  Surgeon: Wyline Mood, MD;  Location: [name] ENDOSCOPY;  Service: Endoscopy;   Laterality: N/A;  . LUMBAR FUSION  10/12/2009  . LUMBAR LAMINECTOMY  10/12/2009  . REPLACEMENT TOTAL KNEE Right 08/2013    Social History   Tobacco Use  . Smoking status: Former Smoker    Types: Cigarettes    Last attempt to quit: 09/20/1978    Years since quitting: 39.3  . Smokeless tobacco: Never Used  Substance Use Topics  . Alcohol use: Yes    Alcohol/week: 0.0 - 21.0 standard drinks    Comment: " i drink when I want to drink' stated pt     Current Outpatient Medications:  .  allopurinol (ZYLOPRIM) 100 MG tablet, Take 1 tablet (100 mg total) by mouth daily. For 2 weeks, then two by mouth daily, Disp: 46 tablet, Rfl: 6 .  atorvastatin (LIPITOR) 40 MG tablet, Take 1 tablet (40 mg total) by mouth daily., Disp: , Rfl:  .  losartan (COZAAR) 50 MG tablet, Take 1 tablet (50 mg total) by mouth daily. For blood pressure; STOP the one with HCTZ in it, Disp: , Rfl:  .  magnesium oxide (MAG-OX) 400 MG tablet, One by mouth three days a day Friday (10/11) and Saturday(10/12) and Sunday (10/13), then one a day, Disp: , Rfl:  .  Omega 3 1000 MG CAPS, Take 1 capsule (1,000 mg total) by mouth 2 (two) times daily., Disp: , Rfl:   Allergies  Allergen Reactions  . Fluzone [Flu Virus Vaccine]  ROS    No other specific complaints in a complete review of systems (except as listed in HPI above).  Objective  Vitals:   01/29/18 1023 01/29/18 1024  BP:  130/80  Pulse:  83  Resp:  16  Temp:  98.1 F (36.7 C)  TempSrc:  Oral  SpO2:  98%  Weight:  207 lb 9.6 oz (94.2 kg)  Height: 5\' 11"  (1.803 m) 5\' 11"  (1.803 m)     Body mass index is 28.95 kg/m.  Nursing Note and Vital Signs reviewed.  Physical Exam     No results found for this or any previous visit (from the past 48 hour(s)).  Assessment & Plan 1. Gait instability Denies falls in the last 8 months, seen by PCP with negative MRI; received magnesium infusion with heme/onc, discussed with Dr. Sherie Don- consider thiamine  replacement due to long standing history of heavy alcohol use. Connected care to see about assisting patient with getting grab bar in shower and walk-in shower due to increase risk for falls and getting stuck in shower before due to difficulty getting out. Sending to PT to help with balance. Follow up in one month or sooner if needed.  - Ambulatory referral to Physical Therapy - Ambulatory referral to [facility] - thiamine (VITAMIN B-1) 100 MG tablet; Take 1 tablet (100 mg total) by mouth daily.  Dispense: 30 tablet; Refill: 0   2. History of heavy alcohol consumption - thiamine (VITAMIN B-1) 100 MG tablet; Take 1 tablet (100 mg total) by mouth daily.  Dispense: 30 tablet; Refill: 0  3. At high risk for falls - Ambulatory referral to Physical Therapy - Ambulatory referral to [facility]

## 2018-01-29 NOTE — Patient Instructions (Addendum)
-   You will receive a phone call about setting up appointment for physical therapy- to help you with balance.  - Please take thiamine daily, follow up in one month - You will receive a phone call from our connected care team to see how we can help reduce your risk for falls by improving your access to your shower.   Heavy drinking is considered: More than 4 drinks on any day or 14 per week; since you are over 70 years of age we typically cut this in half so a maximum of 2 drinks on any day and 7 drinks a week.

## 2018-02-09 ENCOUNTER — Encounter: Payer: Self-pay | Admitting: Physical Therapy

## 2018-02-09 ENCOUNTER — Ambulatory Visit: Payer: Medicare HMO | Attending: Nurse Practitioner | Admitting: Physical Therapy

## 2018-02-09 ENCOUNTER — Other Ambulatory Visit: Payer: Self-pay

## 2018-02-09 DIAGNOSIS — R2689 Other abnormalities of gait and mobility: Secondary | ICD-10-CM | POA: Insufficient documentation

## 2018-02-09 DIAGNOSIS — M6281 Muscle weakness (generalized): Secondary | ICD-10-CM | POA: Diagnosis not present

## 2018-02-09 DIAGNOSIS — R262 Difficulty in walking, not elsewhere classified: Secondary | ICD-10-CM | POA: Diagnosis not present

## 2018-02-09 NOTE — Therapy (Signed)
[facility] MAIN [facility] SERVICES [address] [place], [place], [address] Phone: [phone]   Fax:  [phone]  Physical Therapy Evaluation  Patient Details  Name: Lonnie Huber MRN: [id number] Date of Birth: May 24, 1945 Referring Provider (PT): Cheryle Horsfall, NP   Encounter Date: 02/09/2018  PT End of Session - 02/09/18 6962    Visit Number  1    Number of Visits  17    Date for PT Re-Evaluation  04/06/18    PT Start Time  0806    PT Stop Time  0900    PT Time Calculation (min)  54 min    Equipment Utilized During Treatment  Gait belt    Activity Tolerance  Patient tolerated treatment well    Behavior During Therapy  [facility] for tasks assessed/performed       Past Medical History:  Diagnosis Date  . Gout   . Hyperlipidemia   . Hypertension   . Thyroid disease     Past Surgical History:  Procedure Laterality Date  . COLONOSCOPY WITH PROPOFOL N/A 10/10/2016   Procedure: COLONOSCOPY WITH PROPOFOL;  Surgeon: Wyline Mood, MD;  Location: [facility] ENDOSCOPY;  Service: Endoscopy;  Laterality: N/A;  . LUMBAR FUSION  10/12/2009  . LUMBAR LAMINECTOMY  10/12/2009  . REPLACEMENT TOTAL KNEE Right 08/2013    There were no vitals filed for this visit.   Subjective Assessment - 02/09/18 0815    Subjective  Patient feels unstable and is stumbling more.     Pertinent History  MD reports staggering gait that has been ongoing for awhile, also patient has a history of alcohol abuse.     How long can you sit comfortably?  unlimited    How long can you stand comfortably?  unlimited    How long can you walk comfortably?  10 mins    Patient Stated Goals  to not have a fear of falling    Currently in Pain?  No/denies    Pain Score  0-No pain         [facility] PT Assessment - 02/09/18 0816      Assessment   Medical Diagnosis  gait instabiity    Referring Provider (PT)  Cheryle Horsfall, NP    Onset Date/Surgical Date  01/29/18    Hand  Dominance  Right    Prior Therapy  no      Restrictions   Weight Bearing Restrictions  No      Balance Screen   Has the patient fallen in the past 6 months  No    Has the patient had a decrease in activity level because of a fear of falling?   Yes    Is the patient reluctant to leave their home because of a fear of falling?   No      Home Public house manager residence    Living Arrangements  Spouse/significant other    Available Help at Discharge  Family    Type of Home  House    Home Access  Stairs to enter    Entrance Stairs-Number of Steps  --   4   Home Layout  One level    Home Equipment  Walker - 2 wheels;Gilmer Mor - single point      Prior Function   Level of Independence  Independent    Leisure  TV,       Cognition   Overall Cognitive Status  Within Functional Limits for  tasks assessed        PAIN: reports no pain; intermittent  right knee stiffness and swelling ; intermittent back stiffness   POSTURE: WNL   PROM/AROM: BUE and BLE WFL   Right knee flex limited 120 deg   STRENGTH:  Graded on a 0-5 scale Muscle Group Left Right  Shoulder flex                        Hip Flex 4/5 4/5  Hip Abd 3+/5 3+/5  Hip Add -3/5 -3/5  Hip Ext 3/5 3/5      Knee Flex 5/5 5/5  Knee Ext 5/5 5/5  Ankle DF 4/5 4/5  Ankle PF 4/5 4/5  Poor quality PF BLE with accessory motions including hip flex and knee flex to complete the motion    SENSATION: WNL BUE and BLE  FUNCTIONAL MOBILITY: independent sit to stand with use of UE for balance   BALANCE: Static Standing Balance  Normal Able to maintain standing balance against maximal resistance   Good Able to maintain standing balance against moderate resistance   Good-/Fair+ Able to maintain standing balance against minimal resistance x  Fair Able to stand unsupported without UE support and without LOB for 1-2 min   Fair- Requires Min A and UE support to maintain standing without loss of balance   Poor+  Requires mod A and UE support to maintain standing without loss of balance   Poor Requires max A and UE support to maintain standing balance without loss    Standing Dynamic Balance  Normal Stand independently unsupported, able to weight shift and cross midline maximally   Good Stand independently unsupported, able to weight shift and cross midline moderately x  Good-/Fair+ Stand independently unsupported, able to weight shift across midline minimally   Fair Stand independently unsupported, weight shift, and reach ipsilaterally, loss of balance when crossing midline   Poor+ Able to stand with Min A and reach ipsilaterally, unable to weight shift   Poor Able to stand with Mod A and minimally reach ipsilaterally, unable to cross midline.     GAIT: Patient ambulates without AD with slow gait speed and deviations in path with head turns and changes in speed   OUTCOME MEASURES: TEST Outcome Interpretation  5 times sit<>stand 21.22 secec >84 yo, >15 sec indicates increased risk for falls  10 meter walk test    .92             m/s <1.0 m/s indicates increased risk for falls; limited community ambulator  Timed up and Go   13.15              sec <14 sec indicates increased risk for falls  6 minute walk test      700          Feet 1000 feet is community Financial controller 48/56 <36/56 (100% risk for falls), 37-45 (80% risk for falls); 46-51 (>50% risk for falls); 52-55 (lower risk <25% of falls)       Treatment: Single leg heel raise x 20 BLE Leg press 100 lbs x 15 x 2  No reports of pain, needs cues to not flex knee           Objective measurements completed on examination: See above findings.              PT Education - 02/09/18 1610    Education Details  POC  Person(s) Educated  Patient    Methods  Explanation    Comprehension  Verbalized understanding       PT Short Term Goals - 02/09/18 0946      PT SHORT TERM GOAL #1   Title  Patient will be  independent in home exercise program to improve strength/mobility for better functional independence with ADLs.    Time  4    Period  Weeks    Status  New    Target Date  03/09/18        PT Long Term Goals - 02/09/18 0920      PT LONG TERM GOAL #1   Title  Patient will increase Berg Balance score by > 6 points to demonstrate decreased fall risk during functional activities.    Baseline  48/56    Time  8    Period  Weeks    Status  New    Target Date  04/06/18      PT LONG TERM GOAL #2   Title  Patient will reduce timed up and go to <11 seconds to reduce fall risk and demonstrate improved transfer/gait ability.    Time  8    Period  Weeks    Status  New    Target Date  04/06/18      PT LONG TERM GOAL #3   Title  Patient will increase felxibilty to B hip flexors muscles indicated wiht negative ely's test to improve overall fittness and ease of mobility.     Baseline  + ELys B LE    Time  8    Period  Weeks    Status  New    Target Date  04/06/18      PT LONG TERM GOAL #4   Title  Patient will improve BLE ankle PF strength to be able to perform single leg heel raise x 20 without deviations to improve quality of gait and dynamic standing balance.     Time  8    Period  Weeks    Status  New             Plan - 02/09/18 1013    Clinical Impression Statement  Patient is 72 year old male who has gait instabiity. He has decreased Berg balance test 48/56, decreased TUG , decreased ankle PF strength, decreased BLE hip flexor stability with + Elys bilaterally, decreased gait speed and decreased 6 MW. Patinet will benefit from skilled PT to improve balance, safety and  decrease his fear of falling.     Clinical Presentation  Stable    Clinical Decision Making  Low    Rehab Potential  Good    PT Frequency  2x / week    PT Duration  8 weeks    PT Treatment/Interventions  Manual techniques;Patient/family education;Neuromuscular re-education;Balance training;Therapeutic  activities;Therapeutic exercise;Moist Heat;Cryotherapy;Ultrasound    PT Next Visit Plan  stretching hip flexors, dynamic standing balance training, add to HEP for ankle strength    PT Home Exercise Plan  single leg heel raises    Consulted and Agree with Plan of Care  Patient       Patient will benefit from skilled therapeutic intervention in order to improve the following deficits and impairments:  Decreased balance, Decreased endurance, Difficulty walking, Decreased range of motion, Impaired flexibility, Decreased activity tolerance, Decreased strength  Visit Diagnosis: Other abnormalities of gait and mobility  Muscle weakness (generalized)  Difficulty in walking, not elsewhere classified     Problem  List Patient Active Problem List   Diagnosis Date Noted  . High serum ferritin 12/31/2017  . Medication monitoring encounter 07/03/2017  . Hyperglycemia 11/28/2015  . Hyperlipidemia 04/24/2015  . Chronic kidney disease (CKD), stage II (mild) 09/20/2014  . Chronic LBP 09/20/2014  . Diverticulosis of colon 09/20/2014  . Failure of erection 09/20/2014  . Gout 09/20/2014  . Chronic pain of right knee 09/20/2014  . Alcohol abuse 09/20/2014  . Nondependent alcohol abuse, episodic drinking behavior 07/10/2008  . Decreased libido 10/20/2007  . Benign essential HTN 06/18/2007  . Combined fat and carbohydrate induced hyperlipemia 06/18/2007    Lonnie Huber, [place] DPT 02/09/2018, 11:00 AM  [facility] MAIN [facility] SERVICES [address] [place], [place], [address] Phone: [phone]   Fax:  [phone]  Name: Lonnie Huber MRN: [id number] Date of Birth: [birth year]

## 2018-02-16 ENCOUNTER — Encounter: Payer: Self-pay | Admitting: Physical Therapy

## 2018-02-16 ENCOUNTER — Ambulatory Visit: Payer: Medicare HMO | Attending: Nurse Practitioner | Admitting: Physical Therapy

## 2018-02-16 DIAGNOSIS — R262 Difficulty in walking, not elsewhere classified: Secondary | ICD-10-CM | POA: Insufficient documentation

## 2018-02-16 DIAGNOSIS — M6281 Muscle weakness (generalized): Secondary | ICD-10-CM | POA: Diagnosis not present

## 2018-02-16 DIAGNOSIS — R2689 Other abnormalities of gait and mobility: Secondary | ICD-10-CM | POA: Diagnosis not present

## 2018-02-16 NOTE — Therapy (Signed)
[facility] [facility] MAIN [facility] SERVICES [address] [place], [place], [address] Phone: [phone]   Fax:  [phone]  Physical Therapy Treatment  Patient Details  Name: Lonnie Huber MRN: [id number] Date of Birth: 01-19-1946 Referring Provider (PT): Cheryle Horsfall, NP   Encounter Date: 02/16/2018  PT End of Session - 02/16/18 0813    Visit Number  2    Number of Visits  17    Date for PT Re-Evaluation  04/06/18    PT Start Time  0807    PT Stop Time  0845    PT Time Calculation (min)  38 min    Equipment Utilized During Treatment  Gait belt    Activity Tolerance  Patient tolerated treatment well    Behavior During Therapy  [facility] for tasks assessed/performed       Past Medical History:  Diagnosis Date  . Gout   . Hyperlipidemia   . Hypertension   . Thyroid disease     Past Surgical History:  Procedure Laterality Date  . COLONOSCOPY WITH PROPOFOL N/A 10/10/2016   Procedure: COLONOSCOPY WITH PROPOFOL;  Surgeon: Wyline Mood, MD;  Location: [facility] ENDOSCOPY;  Service: Endoscopy;  Laterality: N/A;  . LUMBAR FUSION  10/12/2009  . LUMBAR LAMINECTOMY  10/12/2009  . REPLACEMENT TOTAL KNEE Right 08/2013    There were no vitals filed for this visit.  Subjective Assessment - 02/16/18 0811    Subjective  Patient feels unstable and is stumbling more.     Pertinent History  MD reports staggering gait that has been ongoing for awhile, also patient has a history of alcohol abuse.     How long can you sit comfortably?  unlimited    How long can you stand comfortably?  unlimited    How long can you walk comfortably?  10 mins    Patient Stated Goals  to not have a fear of falling    Currently in Pain?  Yes    Pain Score  2     Pain Location  Shoulder    Pain Orientation  Left    Pain Descriptors / Indicators  Aching    Pain Type  Chronic pain    Pain Onset  In the past 7 days    Pain Frequency  Intermittent    Multiple Pain Sites  --   left  hip left 2/10      Ther-ex  [employer] fitness x 5 mins no resistance  TM walking elevation 2 x 5 mins speed 1. 0 miles / hour Hooklying marching cues  for increased hip flexor recruitment and TA  Contraction 2 x 10 bilateral; Supine R hip SLR  with gentle manual resistance 2 x 10; Hooklying clams with manual  resistance 2 x 10; Hookliying adductor squeeze  2 x 10;  Hooklying bridges  2 x 10; Hooklying resisted trunk rotation at knees for oblique activation 3s hold 2 x 10 each direction; hooklying abd/ER with gTB x 20 Sit to stand from  mat table  2 x 10 sidelying hip abd x 15 x 2 BLE                        PT Education - 02/16/18 0813    Education Details  HEP    Person(s) Educated  Patient    Methods  Explanation    Comprehension  Verbalized understanding;Returned demonstration;Need further instruction       PT Short Term Goals -  02/09/18 0946      PT SHORT TERM GOAL #1   Title  Patient will be independent in home exercise program to improve strength/mobility for better functional independence with ADLs.    Time  4    Period  Weeks    Status  New    Target Date  03/09/18        PT Long Term Goals - 02/09/18 0920      PT LONG TERM GOAL #1   Title  Patient will increase Berg Balance score by > 6 points to demonstrate decreased fall risk during functional activities.    Baseline  48/56    Time  8    Period  Weeks    Status  New    Target Date  04/06/18      PT LONG TERM GOAL #2   Title  Patient will reduce timed up and go to <11 seconds to reduce fall risk and demonstrate improved transfer/gait ability.    Time  8    Period  Weeks    Status  New    Target Date  04/06/18      PT LONG TERM GOAL #3   Title  Patient will increase felxibilty to B hip flexors muscles indicated wiht negative ely's test to improve overall fittness and ease of mobility.     Baseline  + ELys B LE    Time  8    Period  Weeks    Status  New    Target Date  04/06/18       PT LONG TERM GOAL #4   Title  Patient will improve BLE ankle PF strength to be able to perform single leg heel raise x 20 without deviations to improve quality of gait and dynamic standing balance.     Time  8    Period  Weeks    Status  New            Plan - 02/16/18 0814    Clinical Impression Statement  Patient limited in session today due to fatigue and exhaustion from pain and fatigue and weakness. Unable to perform standing exercises  today due to pain and weakness, but was able to perform supine and seated exercises with no increased pain .  Patient will continue to benefit from skilled physical therapy to improve pain and mobility.    Rehab Potential  Good    PT Frequency  2x / week    PT Duration  8 weeks    PT Treatment/Interventions  Manual techniques;Patient/family education;Neuromuscular re-education;Balance training;Therapeutic activities;Therapeutic exercise;Moist Heat;Cryotherapy;Ultrasound    PT Next Visit Plan  stretching hip flexors, dynamic standing balance training, add to HEP for ankle strength    PT Home Exercise Plan  single leg heel raises    Consulted and Agree with Plan of Care  Patient       Patient will benefit from skilled therapeutic intervention in order to improve the following deficits and impairments:  Decreased balance, Decreased endurance, Difficulty walking, Decreased range of motion, Impaired flexibility, Decreased activity tolerance, Decreased strength  Visit Diagnosis: Other abnormalities of gait and mobility  Muscle weakness (generalized)  Difficulty in walking, not elsewhere classified     Problem List Patient Active Problem List   Diagnosis Date Noted  . High serum ferritin 12/31/2017  . Medication monitoring encounter 07/03/2017  . Hyperglycemia 11/28/2015  . Hyperlipidemia 04/24/2015  . Chronic kidney disease (CKD), stage II (mild) 09/20/2014  . Chronic LBP 09/20/2014  .  Diverticulosis of colon 09/20/2014  . Failure of  erection 09/20/2014  . Gout 09/20/2014  . Chronic pain of right knee 09/20/2014  . Alcohol abuse 09/20/2014  . Nondependent alcohol abuse, episodic drinking behavior 07/10/2008  . Decreased libido 10/20/2007  . Benign essential HTN 06/18/2007  . Combined fat and carbohydrate induced hyperlipemia 06/18/2007    Ezekiel Ina, [place] DPT 02/16/2018, 8:16 AM  [place] [facility] MAIN [facility] SERVICES [address] [place], [place], [address] Phone: [phone]   Fax:  [phone]  Name: Lonnie Huber MRN: [id number] Date of Birth: 1945/10/25

## 2018-02-18 ENCOUNTER — Ambulatory Visit: Payer: Medicare HMO | Admitting: Physical Therapy

## 2018-02-23 ENCOUNTER — Encounter: Payer: Self-pay | Admitting: Physical Therapy

## 2018-02-23 ENCOUNTER — Ambulatory Visit: Payer: Medicare HMO | Admitting: Physical Therapy

## 2018-02-23 DIAGNOSIS — M6281 Muscle weakness (generalized): Secondary | ICD-10-CM | POA: Diagnosis not present

## 2018-02-23 DIAGNOSIS — R2689 Other abnormalities of gait and mobility: Secondary | ICD-10-CM

## 2018-02-23 DIAGNOSIS — R262 Difficulty in walking, not elsewhere classified: Secondary | ICD-10-CM | POA: Diagnosis not present

## 2018-02-23 NOTE — Therapy (Signed)
[facility] MAIN [facility] SERVICES [address] [place], [place], [address] Phone: [phone]   Fax:  [phone]  Physical Therapy Treatment  Patient Details  Name: Lonnie Huber MRN: [id number] Date of Birth: 1945-11-27 Referring Provider (PT): Cheryle Horsfall, NP   Encounter Date: 02/23/2018  PT End of Session - 02/23/18 0821    Visit Number  3    Number of Visits  17    Date for PT Re-Evaluation  04/06/18    PT Start Time  0815    PT Stop Time  0855    PT Time Calculation (min)  40 min    Equipment Utilized During Treatment  Gait belt    Activity Tolerance  Patient tolerated treatment well    Behavior During Therapy  [facility] for tasks assessed/performed       Past Medical History:  Diagnosis Date  . Gout   . Hyperlipidemia   . Hypertension   . Thyroid disease     Past Surgical History:  Procedure Laterality Date  . COLONOSCOPY WITH PROPOFOL N/A 10/10/2016   Procedure: COLONOSCOPY WITH PROPOFOL;  Surgeon: Wyline Mood, MD;  Location: [facility] ENDOSCOPY;  Service: Endoscopy;  Laterality: N/A;  . LUMBAR FUSION  10/12/2009  . LUMBAR LAMINECTOMY  10/12/2009  . REPLACEMENT TOTAL KNEE Right 08/2013    There were no vitals filed for this visit.  Subjective Assessment - 02/23/18 0821    Subjective  Patient feels unstable and is stumbling more.     Pertinent History  MD reports staggering gait that has been ongoing for awhile, also patient has a history of alcohol abuse.     How long can you sit comfortably?  unlimited    How long can you stand comfortably?  unlimited    How long can you walk comfortably?  10 mins    Patient Stated Goals  to not have a fear of falling    Currently in Pain?  No/denies    Pain Score  0-No pain    Pain Onset  In the past 7 days    Multiple Pain Sites  No       Ther-ex Octane fitness L 4 x 5 mins  Sit to stand from regular chair with Airex pad on seat 2 x 10  Standing mini squats 2 x 10 in // bars  without UE support; Standing hip abduction 2# ankle weights (AW) 2 x 10 bilateral; Standing hip extension 2#  2 x 10 bilateral; Standing hip flexion marching 2# AW 2 x 10 bilateral; Standing HS curls 2#  2 x 10 bilateral; LAQ with 2# ankle weights with 3 second holds x10 each LE  STS from chair without UE support 2x10  Heel raises  x20 with no UE support  Matrix fwd / bwd 22. 5 lbs x 5 reps     Pt educated throughout session about proper posture and technique with exercises. Improved exercise technique, movement at target joints, use of target muscles after min to mod verbal, visual, tactile cues.                         PT Education - 02/23/18 0821    Education Details  HEP    Person(s) Educated  Patient    Methods  Explanation    Comprehension  Verbalized understanding;Returned demonstration;Need further instruction       PT Short Term Goals - 02/09/18 0946      PT  SHORT TERM GOAL #1   Title  Patient will be independent in home exercise program to improve strength/mobility for better functional independence with ADLs.    Time  4    Period  Weeks    Status  New    Target Date  03/09/18        PT Long Term Goals - 02/09/18 0920      PT LONG TERM GOAL #1   Title  Patient will increase Berg Balance score by > 6 points to demonstrate decreased fall risk during functional activities.    Baseline  48/56    Time  8    Period  Weeks    Status  New    Target Date  04/06/18      PT LONG TERM GOAL #2   Title  Patient will reduce timed up and go to <11 seconds to reduce fall risk and demonstrate improved transfer/gait ability.    Time  8    Period  Weeks    Status  New    Target Date  04/06/18      PT LONG TERM GOAL #3   Title  Patient will increase felxibilty to B hip flexors muscles indicated wiht negative ely's test to improve overall fittness and ease of mobility.     Baseline  + ELys B LE    Time  8    Period  Weeks    Status  New    Target Date   04/06/18      PT LONG TERM GOAL #4   Title  Patient will improve BLE ankle PF strength to be able to perform single leg heel raise x 20 without deviations to improve quality of gait and dynamic standing balance.     Time  8    Period  Weeks    Status  New            Plan - 02/23/18 1610    Clinical Impression Statement  Patient demonstrates understanding of HEP with min corrections needed. Patient challenged closed chain and open chain exercises with multiple repetitions due to fatigue. Weak LE combined with weak core musculature results in fair postural control and moderate balance deficits.    Rehab Potential  Good    PT Frequency  2x / week    PT Duration  8 weeks    PT Treatment/Interventions  Manual techniques;Patient/family education;Neuromuscular re-education;Balance training;Therapeutic activities;Therapeutic exercise;Moist Heat;Cryotherapy;Ultrasound    PT Next Visit Plan  stretching hip flexors, dynamic standing balance training, add to HEP for ankle strength    PT Home Exercise Plan  single leg heel raises    Consulted and Agree with Plan of Care  Patient       Patient will benefit from skilled therapeutic intervention in order to improve the following deficits and impairments:  Decreased balance, Decreased endurance, Difficulty walking, Decreased range of motion, Impaired flexibility, Decreased activity tolerance, Decreased strength  Visit Diagnosis: Other abnormalities of gait and mobility  Muscle weakness (generalized)  Difficulty in walking, not elsewhere classified     Problem List Patient Active Problem List   Diagnosis Date Noted  . High serum ferritin 12/31/2017  . Medication monitoring encounter 07/03/2017  . Hyperglycemia 11/28/2015  . Hyperlipidemia 04/24/2015  . Chronic kidney disease (CKD), stage II (mild) 09/20/2014  . Chronic LBP 09/20/2014  . Diverticulosis of colon 09/20/2014  . Failure of erection 09/20/2014  . Gout 09/20/2014  .  Chronic pain of right knee 09/20/2014  .  Alcohol abuse 09/20/2014  . Nondependent alcohol abuse, episodic drinking behavior 07/10/2008  . Decreased libido 10/20/2007  . Benign essential HTN 06/18/2007  . Combined fat and carbohydrate induced hyperlipemia 06/18/2007    Ezekiel Ina, [place] DPT 02/23/2018, 8:23 AM  [place] [facility] MAIN [facility] SERVICES [address] [place], [place], [address] Phone: [phone]   Fax:  [phone]  Name: Lonnie Huber MRN: [id number] Date of Birth: [birth year]

## 2018-02-24 ENCOUNTER — Other Ambulatory Visit: Payer: Self-pay | Admitting: Family Medicine

## 2018-02-25 ENCOUNTER — Ambulatory Visit: Payer: Medicare HMO | Admitting: Physical Therapy

## 2018-03-01 ENCOUNTER — Other Ambulatory Visit: Payer: Self-pay | Admitting: Family Medicine

## 2018-03-02 ENCOUNTER — Ambulatory Visit: Payer: Medicare HMO | Admitting: Physical Therapy

## 2018-03-02 ENCOUNTER — Encounter: Payer: Self-pay | Admitting: Physical Therapy

## 2018-03-02 ENCOUNTER — Ambulatory Visit: Payer: Medicare HMO | Admitting: Family Medicine

## 2018-03-02 DIAGNOSIS — R262 Difficulty in walking, not elsewhere classified: Secondary | ICD-10-CM | POA: Diagnosis not present

## 2018-03-02 DIAGNOSIS — M6281 Muscle weakness (generalized): Secondary | ICD-10-CM

## 2018-03-02 DIAGNOSIS — R2689 Other abnormalities of gait and mobility: Secondary | ICD-10-CM | POA: Diagnosis not present

## 2018-03-02 NOTE — Therapy (Signed)
[place] [facility] MAIN [facility] [address] [place], [place], [address] Phone: [phone]   Fax:  [phone]  Physical Therapy Treatment  Patient Details  Name: Lonnie Huber MRN: [id number] Date of Birth: 11/02/1945 Referring Provider (PT): Cheryle HorsfallPoulose, Elizabeth E, NP   Encounter Date: 03/02/2018  PT End of Session - 03/02/18 0807    Visit Number  4    Number of Visits  17    Date for PT Re-Evaluation  04/06/18    PT Start Time  0803    PT Stop Time  0845    PT Time Calculation (min)  42 min    Equipment Utilized During Treatment  Gait belt    Activity Tolerance  Patient tolerated treatment well    Behavior During Therapy  [facility] for tasks assessed/performed       Past Medical History:  Diagnosis Date  . Gout   . Hyperlipidemia   . Hypertension   . Thyroid disease     Past Surgical History:  Procedure Laterality Date  . COLONOSCOPY WITH PROPOFOL N/A 10/10/2016   Procedure: COLONOSCOPY WITH PROPOFOL;  Surgeon: Wyline MoodAnna, Kiran, MD;  [facility];  Service: Endoscopy;  Laterality: N/A;  . LUMBAR FUSION  10/12/2009  . LUMBAR LAMINECTOMY  10/12/2009  . REPLACEMENT TOTAL KNEE Right 08/2013    There were no vitals filed for this visit.  Subjective Assessment - 03/02/18 0806    Subjective  Patient reports feeling well; no pain currently; He reports some soreness when doing the tband exercise as part of his HEP; He reports inconsistent performance of HEP    Pertinent History  MD reports staggering gait that has been ongoing for awhile, also patient has a history of alcohol abuse.     How long can you sit comfortably?  unlimited    How long can you stand comfortably?  unlimited    How long can you walk comfortably?  10 mins    Patient Stated Goals  to not have a fear of falling    Currently in Pain?  No/denies    Pain Onset  In the past 7 days    Multiple Pain Sites  No         Ther-ex Warm up on treadmill, 1.0 mph with 2  HHA x3 min with min VCS to increase step length, increase erect posture and improve foot clearance;  Leg press, BLE plate 24#90# [id number] with min Vcs for positioning and to slow down LE movement for control; Leg press, BLE heel raises 90# 2x12 with min Vcs for positioning and to keep knees straight for better calf strengthening;   Patient instructed in advanced balance exercise  Standing in parallel bars:  Standing on airex foam: -alternate toe taps to 4 inch step with 2-1 rail assist x15 reps bilaterally; -Standing one foot on airex, one foot on 4 inch step, BUE ball pass side/side x5 reps each foot on step Requires min A for safety with min VCs to slow down trunk rotation and improve better posture for better balance control;  -mini squat unsupported x10 reps with cues for reaching forward for better stance control; -Alternate march with finger tip hold on rail with min Vcs to increase hip flexion for better strength and balance challenge x10 reps bilaterally -modified tandem stance: head turns side/side, up/down x5 reps each, each foot in front; Patient required min VCs for balance stability, including to increase trunk control for less loss of balance with smaller base  of support   Instructed patient in dynamic balance exercise: Resisted walking, 12.5# forward/backward, side/side x4 way, x2 laps each direction; required close supervision to CGA for safety and cues to improve weight shift especially with eccentric control for better balance control.   Ladder drills: Forward reciprocal gait x2 laps Forward walking, big steps, (skipping square) x4 laps with cues to increase arm swing and increase speed for better dynamic balance with large steps Forward step with contralateral leg lift (SLS) 3 sec hold x2 laps, min A for safety and cues to improve core stabilization for better stance control Out-out-in-in x2 laps with min VCs for sequencing and positioning and to increase speed;                            PT Education - 03/02/18 0807    Education Details  HEP reinforced, strengthening, balance    Person(s) Educated  Patient    Methods  Explanation;Demonstration;Verbal cues    Comprehension  Verbalized understanding;Returned demonstration;Verbal cues required;Need further instruction       PT Short Term Goals - 02/09/18 0946      PT SHORT TERM GOAL #1   Title  Patient will be independent in home exercise program to improve strength/mobility for better functional independence with ADLs.    Time  4    Period  Weeks    Status  New    Target Date  03/09/18        PT Long Term Goals - 02/09/18 0920      PT LONG TERM GOAL #1   Title  Patient will increase Berg Balance score by > 6 points to demonstrate decreased fall risk during functional activities.    Baseline  48/56    Time  8    Period  Weeks    Status  New    Target Date  04/06/18      PT LONG TERM GOAL #2   Title  Patient will reduce timed up and go to <11 seconds to reduce fall risk and demonstrate improved transfer/gait ability.    Time  8    Period  Weeks    Status  New    Target Date  04/06/18      PT LONG TERM GOAL #3   Title  Patient will increase felxibilty to B hip flexors muscles indicated wiht negative ely's test to improve overall fittness and ease of mobility.     Baseline  + ELys B LE    Time  8    Period  Weeks    Status  New    Target Date  04/06/18      PT LONG TERM GOAL #4   Title  Patient will improve BLE ankle PF strength to be able to perform single leg heel raise x 20 without deviations to improve quality of gait and dynamic standing balance.     Time  8    Period  Weeks    Status  New            Plan - 03/02/18 9528    Clinical Impression Statement  Patient instructed in advanced LE strengthening exercise. He does require min VCS for correct exercise technique including to improve positioning, slow down LE movement for better motor control.  Patient instructed in advanced balance exercise. He does have difficulty keeping balance when on unstable surfaces with less rail assist. He does require cues to improve posture and upper  body positioning/weight shift when standing with narrow base of support. He would benefit from additional skilled PT intervention to improve strength, balance and gait safety.     Rehab Potential  Good    PT Frequency  2x / week    PT Duration  8 weeks    PT Treatment/Interventions  Manual techniques;Patient/family education;Neuromuscular re-education;Balance training;Therapeutic activities;Therapeutic exercise;Moist Heat;Cryotherapy;Ultrasound    PT Next Visit Plan  stretching hip flexors, dynamic standing balance training, add to HEP for ankle strength    PT Home Exercise Plan  single leg heel raises    Consulted and Agree with Plan of Care  Patient       Patient will benefit from skilled therapeutic intervention in order to improve the following deficits and impairments:  Decreased balance, Decreased endurance, Difficulty walking, Decreased range of motion, Impaired flexibility, Decreased activity tolerance, Decreased strength  Visit Diagnosis: Other abnormalities of gait and mobility  Muscle weakness (generalized)  Difficulty in walking, not elsewhere classified     Problem List Patient Active Problem List   Diagnosis Date Noted  . High serum ferritin 12/31/2017  . Medication monitoring encounter 07/03/2017  . Hyperglycemia 11/28/2015  . Hyperlipidemia 04/24/2015  . Chronic kidney disease (CKD), stage II (mild) 09/20/2014  . Chronic LBP 09/20/2014  . Diverticulosis of colon 09/20/2014  . Failure of erection 09/20/2014  . Gout 09/20/2014  . Chronic pain of right knee 09/20/2014  . Alcohol abuse 09/20/2014  . Nondependent alcohol abuse, episodic drinking behavior 07/10/2008  . Decreased libido 10/20/2007  . Benign essential HTN 06/18/2007  . Combined fat and carbohydrate induced  hyperlipemia 06/18/2007    Sherill Mangen PT, DPT 03/02/2018, 8:45 AM  [place] [facility] MAIN [facility] SERVICES [address] [place], [place], [address] Phone: [phone]   Fax:  [phone]  Name: Lonnie Huber MRN: [id number] Date of Birth: 1946-02-26

## 2018-03-03 ENCOUNTER — Encounter: Payer: Self-pay | Admitting: Family Medicine

## 2018-03-03 ENCOUNTER — Ambulatory Visit (INDEPENDENT_AMBULATORY_CARE_PROVIDER_SITE_OTHER): Payer: Medicare HMO | Admitting: Family Medicine

## 2018-03-03 VITALS — BP 128/82 | HR 99 | Temp 98.5°F | Ht 71.0 in | Wt 196.2 lb

## 2018-03-03 DIAGNOSIS — D539 Nutritional anemia, unspecified: Secondary | ICD-10-CM | POA: Diagnosis not present

## 2018-03-03 DIAGNOSIS — R634 Abnormal weight loss: Secondary | ICD-10-CM

## 2018-03-03 DIAGNOSIS — R7989 Other specified abnormal findings of blood chemistry: Secondary | ICD-10-CM | POA: Diagnosis not present

## 2018-03-03 DIAGNOSIS — R2681 Unsteadiness on feet: Secondary | ICD-10-CM

## 2018-03-03 DIAGNOSIS — F1010 Alcohol abuse, uncomplicated: Secondary | ICD-10-CM

## 2018-03-03 NOTE — Patient Instructions (Addendum)
Let's get labs today Keep working with physical therapy I'm proud of your efforts

## 2018-03-03 NOTE — Progress Notes (Signed)
BP 128/82   Pulse 99   Temp 98.5 F (36.9 C)   Ht 5\' 11"  (1.803 m)   Wt 196 lb 3.2 oz (89 kg)   SpO2 98%   BMI 27.36 kg/m    Subjective:    Patient ID: Lonnie Huber, male    DOB: 01/22/1946, 72 y.o.   MRN: [id number]  HPI: Lonnie LocketGonzola L Solanki is a 72 y.o. male  Chief Complaint  Patient presents with  . Follow-up    HPI Patient is here for f/u for gait instability Doing therapy and he is "beat up" he jokes with how much they are working him MRI was done, no new stroke Reviewed report He feels like therapy is helping No falls Reviewed recent labs Anemic; no bleeding from nose or gums, no hematuria, no blood in the stool Alcohol; he is trying to cut back; he says he has only had 2-3 beers in the last few weeks  Depression screen [facility] 2/9 03/03/2018 01/22/2018 12/29/2017 07/03/2017 02/26/2017  Decreased Interest 0 0 0 0 0  Down, Depressed, Hopeless 0 0 1 0 0  PHQ - 2 Score 0 0 1 0 0  Altered sleeping 0 0 0 - -  Tired, decreased energy 0 0 1 - -  Change in appetite 0 0 0 - -  Feeling bad or failure about yourself  0 0 0 - -  Trouble concentrating 0 0 0 - -  Moving slowly or fidgety/restless 0 0 0 - -  Suicidal thoughts 0 0 0 - -  PHQ-9 Score 0 0 2 - -   Fall Risk  03/03/2018 01/22/2018 12/29/2017 07/03/2017 02/26/2017  Falls in the past year? 0 No Yes No No  Number falls in past yr: - - 2 or more - -  Injury with Fall? - - No - -    Relevant past medical, surgical, family and social history reviewed Past Medical History:  Diagnosis Date  . Gout   . Hyperlipidemia   . Hypertension   . Thyroid disease    Past Surgical History:  Procedure Laterality Date  . COLONOSCOPY WITH PROPOFOL N/A 10/10/2016   Procedure: COLONOSCOPY WITH PROPOFOL;  Surgeon: Wyline MoodAnna, Kiran, MD;  [facility];  Service: Endoscopy;  Laterality: N/A;  . LUMBAR FUSION  10/12/2009  . LUMBAR LAMINECTOMY  10/12/2009  . REPLACEMENT TOTAL KNEE Right 08/2013   Family History  Problem  Relation Age of Onset  . Coronary artery disease Mother   . Congestive Heart Failure Father   . Coronary artery disease Brother   . COPD Sister   . Breast cancer Sister [id number]   Social History   Tobacco Use  . Smoking status: Former Smoker    Types: Cigarettes    Last attempt to quit: 09/20/1978    Years since quitting: 39.4  . Smokeless tobacco: Never Used  Substance Use Topics  . Alcohol use: Yes    Alcohol/week: 0.0 - 21.0 standard drinks    Comment: " i drink when I want to drink' stated pt  . Drug use: No     [facility]  AUDIT-C Score  1      Interim medical history since last visit reviewed. Allergies and medications reviewed  Review of Systems Per HPI unless specifically indicated above     Objective:    BP 128/82   Pulse 99   Temp 98.5 F (36.9 C)   Ht 5\' 11"  (1.803 m)  Wt 196 lb 3.2 oz (89 kg)   SpO2 98%   BMI 27.36 kg/m   Wt Readings from Last 3 Encounters:  03/03/18 196 lb 3.2 oz (89 kg)  01/29/18 207 lb 9.6 oz (94.2 kg)  01/27/18 207 lb (93.9 kg)    Physical Exam  Constitutional: He appears well-developed and well-nourished. No distress.  Eyes: No scleral icterus.  Cardiovascular: Normal rate and regular rhythm.  Pulmonary/Chest: Effort normal and breath sounds normal.  Neurological: He is alert. He displays no tremor.  Testing with monofilament intact distally both feet; vibration sense intake distally both feet; propioception testing intact bilaterally  Skin: No pallor.  Fingernail beds are pale relative to examiner; skin on the legs and feet are dry  Psychiatric: He has a normal mood and affect.    Results for orders placed or performed in visit on 01/27/18  CBC with Differential/Platelet  Result Value Ref Range   WBC 6.0 4.0 - 10.5 K/uL   RBC 3.96 (L) 4.22 - 5.81 MIL/uL   Hemoglobin 11.6 (L) 13.0 - 17.0 g/dL   HCT 16.1 (L) 09.6 - 04.5 %   MCV 92.7 80.0 - 100.0 fL   MCH 29.3 26.0 - 34.0  pg   MCHC 31.6 30.0 - 36.0 g/dL   RDW 40.9 81.1 - 91.4 %   Platelets 203 150 - 400 K/uL   nRBC 0.0 0.0 - 0.2 %   Neutrophils Relative % 65 %   Neutro Abs 3.9 1.7 - 7.7 K/uL   Lymphocytes Relative 26 %   Lymphs Abs 1.6 0.7 - 4.0 K/uL   Monocytes Relative 7 %   Monocytes Absolute 0.4 0.1 - 1.0 K/uL   Eosinophils Relative 2 %   Eosinophils Absolute 0.1 0.0 - 0.5 K/uL   Basophils Relative 0 %   Basophils Absolute 0.0 0.0 - 0.1 K/uL   Immature Granulocytes 0 %   Abs Immature Granulocytes 0.01 0.00 - 0.07 K/uL  Iron and TIBC  Result Value Ref Range   Iron 78 45 - 182 ug/dL   TIBC 782 (L) 956 - 213 ug/dL   Saturation Ratios 33 17.9 - 39.5 %   UIBC 162 ug/dL  Ferritin  Result Value Ref Range   Ferritin 146 24 - 336 ng/mL      Assessment & Plan:   Problem List Items Addressed This Visit      Other   High serum ferritin   Alcohol abuse   Relevant Orders   Vitamin B1    Other Visit Diagnoses    Unsteady gait    -  Primary   continue physical therapy   Relevant Orders   CBC with Differential/Platelet   B12 and Folate Panel   Vitamin B1   Nutritional anemia       check B12, folate   Relevant Orders   CBC with Differential/Platelet   B12 and Folate Panel   Hypomagnesemia       check Mg2+ today; hopefully better with decreased EtOH consumption   Relevant Orders   Magnesium   Weight loss       close f/u; may be from decreasing all of the empty calories in teh alcohol       Follow up plan: Return in about 4 weeks (around 03/31/2018) for follow-up visit with Dr. Sherie Don.  An after-visit summary was printed and given to the patient at check-out.  Please see the patient instructions which may contain other information and recommendations beyond what is mentioned above in  the assessment and plan.  No orders of the defined types were placed in this encounter.   Orders Placed This Encounter  Procedures  . CBC with Differential/Platelet  . B12 and Folate Panel  . Vitamin  B1  . Magnesium

## 2018-03-04 ENCOUNTER — Encounter: Payer: Self-pay | Admitting: Physical Therapy

## 2018-03-04 ENCOUNTER — Ambulatory Visit: Payer: Medicare HMO

## 2018-03-04 DIAGNOSIS — R2689 Other abnormalities of gait and mobility: Secondary | ICD-10-CM | POA: Diagnosis not present

## 2018-03-04 DIAGNOSIS — R262 Difficulty in walking, not elsewhere classified: Secondary | ICD-10-CM

## 2018-03-04 DIAGNOSIS — M6281 Muscle weakness (generalized): Secondary | ICD-10-CM | POA: Diagnosis not present

## 2018-03-04 NOTE — Therapy (Signed)
[place] [facility] MAIN [facility]REHAB SERVICES [address] [place], [place], [address] Phone: [phone]   Fax:  [phone]  Physical Therapy Treatment  Patient Details  Name: Lonnie LocketGonzola L Basque MRN: [id number] Date of Birth: 01/21/1946 Referring Provider (PT): Cheryle HorsfallPoulose, Elizabeth E, NP   Encounter Date: 03/04/2018  PT End of Session - 03/04/18 0814    Visit Number  5    Number of Visits  17    Date for PT Re-Evaluation  04/06/18    PT Start Time  0806    PT Stop Time  0847    PT Time Calculation (min)  41 min    Equipment Utilized During Treatment  Gait belt    Activity Tolerance  Patient tolerated treatment well    Behavior During Therapy  [facility] for tasks assessed/performed       Past Medical History:  Diagnosis Date  . Gout   . Hyperlipidemia   . Hypertension   . Thyroid disease     Past Surgical History:  Procedure Laterality Date  . COLONOSCOPY WITH PROPOFOL N/A 10/10/2016   Procedure: COLONOSCOPY WITH PROPOFOL;  Surgeon: Wyline MoodAnna, Kiran, MD;  [facility];  Service: Endoscopy;  Laterality: N/A;  . LUMBAR FUSION  10/12/2009  . LUMBAR LAMINECTOMY  10/12/2009  . REPLACEMENT TOTAL KNEE Right 08/2013    There were no vitals filed for this visit.  Subjective Assessment - 03/04/18 0812    Subjective  Patient reports no falls or pain since last PT session. Thinks therapy is going well and is helpful    Pertinent History  MD reports staggering gait that has been ongoing for awhile, also patient has a history of alcohol abuse.     How long can you sit comfortably?  unlimited    How long can you stand comfortably?  unlimited    How long can you walk comfortably?  10 mins    Patient Stated Goals  to not have a fear of falling    Currently in Pain?  No/denies       Ther-ex Warm up on treadmill, 1.0 mph with 2 HHA x3 min with min VCS to increase step length as well as posture   Leg press, BLE plate 27#90# [id number] with verbal cues for activity  pacing Leg press unilateral leg press 45# 2 x10 with verbal cues for form Leg press, BLE heel raises 90# 2x12 with min tactile cues for positioning and to keep knees straight for better calf strengthening;               Patient instructed in advanced balance exercise   Standing in parallel bars:   Standing on airex foam: -alternate toe taps to 4 inch step with 1 ail assist x10, no rail assist x10 reps bilaterally; -Standing one foot on airex, one foot on 4 inch step, BUE ball pass up/down to self x15 reps each foot on step Requires CGA for safety, verbal cues for form -mini squat unsupported x10 reps with cues for reaching forward for better stance control and upright posture ; -Alternate march without finger tip hold on rail with min Vcs to increase hip flexion for better strength and balance challenge x15 reps bilaterally -modified tandem stance: head turns side/side, up/down x10 reps each, each foot in front; Patient required min VCs for pacing of activity CGA/close supervision -FT EO on foam 3x30s without UE CGA/close supervision Single leg stance bilaterally 3x15sec on ea side, with finger tip support. Most challenged with standing  on LLE and maintaining erect posture, CGA for safety   Instructed patient in dynamic balance exercise: Resisted walking, 12.5# forward/backward, side/side x4 way, x2 laps each direction; required  CGA for safety and cues to improve weight shift especially with eccentric control for better balance control.      PT Short Term Goals - 02/09/18 0946      PT SHORT TERM GOAL #1   Title  Patient will be independent in home exercise program to improve strength/mobility for better functional independence with ADLs.    Time  4    Period  Weeks    Status  New    Target Date  03/09/18        PT Long Term Goals - 02/09/18 0920      PT LONG TERM GOAL #1   Title  Patient will increase Berg Balance score by > 6 points to demonstrate decreased fall risk during  functional activities.    Baseline  48/56    Time  8    Period  Weeks    Status  New    Target Date  04/06/18      PT LONG TERM GOAL #2   Title  Patient will reduce timed up and go to <11 seconds to reduce fall risk and demonstrate improved transfer/gait ability.    Time  8    Period  Weeks    Status  New    Target Date  04/06/18      PT LONG TERM GOAL #3   Title  Patient will increase felxibilty to B hip flexors muscles indicated wiht negative ely's test to improve overall fittness and ease of mobility.     Baseline  + ELys B LE    Time  8    Period  Weeks    Status  New    Target Date  04/06/18      PT LONG TERM GOAL #4   Title  Patient will improve BLE ankle PF strength to be able to perform single leg heel raise x 20 without deviations to improve quality of gait and dynamic standing balance.     Time  8    Period  Weeks    Status  New            Plan - 03/04/18 0949    Clinical Impression Statement  Patient able to progress strengthening program without complaint. Most challenged by single leg stance during balance activities. Verbal cues needed to address postural and weight shifts to maximize safety as well as exercise form. The patient would benefit from further skilled PT to continue to progress strength and balance program to improve ability to perform functional activities as well as safety.     Rehab Potential  Good    PT Frequency  2x / week    PT Duration  8 weeks    PT Treatment/Interventions  Manual techniques;Patient/family education;Neuromuscular re-education;Balance training;Therapeutic activities;Therapeutic exercise;Moist Heat;Cryotherapy;Ultrasound    PT Next Visit Plan  stretching hip flexors, dynamic standing balance training, add to HEP for ankle strength    PT Home Exercise Plan  single leg heel raises    Consulted and Agree with Plan of Care  Patient       Patient will benefit from skilled therapeutic intervention in order to improve the  following deficits and impairments:  Decreased balance, Decreased endurance, Difficulty walking, Decreased range of motion, Impaired flexibility, Decreased activity tolerance, Decreased strength  Visit Diagnosis: Other abnormalities of gait and  mobility  Muscle weakness (generalized)  Difficulty in walking, not elsewhere classified     Problem List Patient Active Problem List   Diagnosis Date Noted  . High serum ferritin 12/31/2017  . Medication monitoring encounter 07/03/2017  . Hyperglycemia 11/28/2015  . Hyperlipidemia 04/24/2015  . Chronic kidney disease (CKD), stage II (mild) 09/20/2014  . Chronic LBP 09/20/2014  . Diverticulosis of colon 09/20/2014  . Failure of erection 09/20/2014  . Gout 09/20/2014  . Chronic pain of right knee 09/20/2014  . Alcohol abuse 09/20/2014  . Nondependent alcohol abuse, episodic drinking behavior 07/10/2008  . Decreased libido 10/20/2007  . Benign essential HTN 06/18/2007  . Combined fat and carbohydrate induced hyperlipemia 06/18/2007    Olga Coaster PT, DPT 9:54 AM,03/04/18 [phone]  [facility] MAIN [facility] SERVICES [address] [place], [place], [address] Phone: [phone]   Fax:  [phone]  Name: LEAH SKORA MRN: [id number] Date of Birth: 1946-03-21

## 2018-03-05 NOTE — Progress Notes (Signed)
Lonnie Huber, please let the patient know info from previous note 03/03/18 PLUS his vitamin B12 level is in the normal range. His magnesium level is also normal, but it's at the very bottom of the normal level. Do try to get foods rich in magnesium. One more lab pending.

## 2018-03-08 LAB — CBC WITH DIFFERENTIAL/PLATELET
Basophils Absolute: 20 cells/uL (ref 0–200)
Basophils Relative: 0.4 %
EOS PCT: 0.4 %
Eosinophils Absolute: 20 cells/uL (ref 15–500)
HCT: 42.3 % (ref 38.5–50.0)
Hemoglobin: 14.2 g/dL (ref 13.2–17.1)
Lymphs Abs: 1392 cells/uL (ref 850–3900)
MCH: 29.6 pg (ref 27.0–33.0)
MCHC: 33.6 g/dL (ref 32.0–36.0)
MCV: 88.3 fL (ref 80.0–100.0)
MONOS PCT: 7.9 %
MPV: 10.6 fL (ref 7.5–12.5)
NEUTROS PCT: 64 %
Neutro Abs: 3264 cells/uL (ref 1500–7800)
PLATELETS: 209 10*3/uL (ref 140–400)
RBC: 4.79 10*6/uL (ref 4.20–5.80)
RDW: 13.4 % (ref 11.0–15.0)
TOTAL LYMPHOCYTE: 27.3 %
WBC mixed population: 403 cells/uL (ref 200–950)
WBC: 5.1 10*3/uL (ref 3.8–10.8)

## 2018-03-08 LAB — MAGNESIUM: MAGNESIUM: 1.5 mg/dL (ref 1.5–2.5)

## 2018-03-08 LAB — B12 AND FOLATE PANEL
FOLATE: 5.4 ng/mL — AB
Vitamin B-12: 431 pg/mL (ref 200–1100)

## 2018-03-08 LAB — VITAMIN B1: Vitamin B1 (Thiamine): 29 nmol/L (ref 8–30)

## 2018-03-08 NOTE — Progress Notes (Signed)
Cala BradfordKimberly, please let the patient know that his thiamine level (vitamin B1) is normal

## 2018-03-09 ENCOUNTER — Ambulatory Visit: Payer: Medicare HMO | Admitting: Physical Therapy

## 2018-03-09 ENCOUNTER — Other Ambulatory Visit: Payer: Self-pay

## 2018-03-09 ENCOUNTER — Telehealth: Payer: Self-pay | Admitting: Family Medicine

## 2018-03-09 DIAGNOSIS — R2681 Unsteadiness on feet: Secondary | ICD-10-CM

## 2018-03-09 DIAGNOSIS — Z87898 Personal history of other specified conditions: Secondary | ICD-10-CM

## 2018-03-09 MED ORDER — ATORVASTATIN CALCIUM 40 MG PO TABS: 40.0000 mg | ORAL_TABLET | Freq: Every day | ORAL | 2 refills | Status: DC

## 2018-03-09 NOTE — Telephone Encounter (Signed)
Copied from CRM [id number]. Topic: Quick Communication - Rx Refill/Question >> Mar 09, 2018  9:52 AM Fanny BienIlderton, Jessica L wrote: Medication:atorvastatin (LIPITOR) 40 MG tablet [[id number]][[id number]]  pt called and stated that he would like some sent to local pharmacy and mail order because he will be out soon. Pt seemed very confused about medications. Please advise   Has the patient contacted their pharmacy? yes Preferred Pharmacy (with phone number or street name): [facility] [place], [place] - [id number] [phone] (Phone) [phone] (Fax)   Agent: Please be advised that RX refills may take up to 3 business days. We ask that you follow-up with your pharmacy.

## 2018-03-09 NOTE — Telephone Encounter (Signed)
Reviewed last SGPT

## 2018-03-18 ENCOUNTER — Ambulatory Visit: Payer: Medicare HMO | Attending: Nurse Practitioner | Admitting: Physical Therapy

## 2018-03-18 ENCOUNTER — Encounter: Payer: Self-pay | Admitting: Physical Therapy

## 2018-03-18 DIAGNOSIS — R262 Difficulty in walking, not elsewhere classified: Secondary | ICD-10-CM | POA: Diagnosis not present

## 2018-03-18 DIAGNOSIS — M6281 Muscle weakness (generalized): Secondary | ICD-10-CM | POA: Diagnosis not present

## 2018-03-18 DIAGNOSIS — R2689 Other abnormalities of gait and mobility: Secondary | ICD-10-CM | POA: Insufficient documentation

## 2018-03-18 NOTE — Therapy (Addendum)
[place] MAIN [facility] SERVICES [address] [place], [place], [address] Phone: [phone]   Fax:  [phone]  Physical Therapy Treatment  Patient Details  Name: ANURAG SCARFO MRN: [id number] Date of Birth: 1945/06/24 Referring Provider (PT): Fredderick Severance, NP   Encounter Date: 03/18/2018  PT End of Session - 03/18/18 5009    Visit Number  6    Number of Visits  17    Date for PT Re-Evaluation  04/06/18    PT Start Time  0805    PT Stop Time  0845    PT Time Calculation (min)  40 min    Equipment Utilized During Treatment  Gait belt    Activity Tolerance  Patient tolerated treatment well    Behavior During Therapy  [facility] for tasks assessed/performed       Past Medical History:  Diagnosis Date  . Gout   . Hyperlipidemia   . Hypertension   . Thyroid disease     Past Surgical History:  Procedure Laterality Date  . COLONOSCOPY WITH PROPOFOL N/A 10/10/2016   Procedure: COLONOSCOPY WITH PROPOFOL;  Surgeon: Jonathon Bellows, MD;  Location: [facility] ENDOSCOPY;  Service: Endoscopy;  Laterality: N/A;  . LUMBAR FUSION  10/12/2009  . LUMBAR LAMINECTOMY  10/12/2009  . REPLACEMENT TOTAL KNEE Right 08/2013    There were no vitals filed for this visit.  Subjective Assessment - 03/18/18 0811    Subjective  Patient reports no falls or pain since last PT session. Thinks therapy is going well and is helpful    Pertinent History  MD reports staggering gait that has been ongoing for awhile, also patient has a history of alcohol abuse.     How long can you sit comfortably?  unlimited    How long can you stand comfortably?  unlimited    How long can you walk comfortably?  10 mins    Patient Stated Goals  to not have a fear of falling    Currently in Pain?  No/denies    Pain Score  0-No pain    Pain Onset  In the past 7 days       Ther-ex  treadmill, 1.2 mph with 2 HHA x  5 min with min VCS to increase step length as well as posture standing  hip ext on matrix 7. 5 lbs, hip abd 7. 5 lbs BLE x 10 x 2  Leg press, BLE plate 90# 3G18 with verbal cues for activity pacing Leg press unilateral leg press 45# 2 x10 with verbal cues for form Leg press, BLE heel raises 90# 2x12 with min tactile cues for positioning and to keep knees straight for better calf strengthening;    Neuromuscular:  Standing on airex foam: alternate toe taps to 4 inch step with 1 ail assist x10, no rail assist x10 reps bilaterally; Standing on foam and lunge to BOSU ball with UE assist x 10 BLE Heel raises unsupported x10 reps with cues  for better stance control and upright posture ; 1/2 foam and : head turns side/side, up/downx10 reps each,each foot in front; Single leg stance bilaterally 3x15sec on ea side, with finger tip support. challenged with  maintaining erect posture, CGA for safety  Instructed patient in dynamic balance exercise: Resisted walking, 12.5# forward/backward, side/side x4 way, x  5laps each direction; required CGAfor safety and cues to improve weight shift especially with eccentric control for better balance control.   CGA and Min to mod verbal cues  used throughout with increased in postural sway and LOB most seen with narrow base of support and while on uneven surfaces. Continues to have balance deficits typical with diagnosis. Patient performs intermediate level exercises without pain behaviors and needs verbal cuing for postural alignment and head positioning      Plan - 12/5//19 1400    Clinical Impression Statement   Patient instructed in advanced dynamic and static balance exercise. Utilized stable and uneven surfaces to further challenge dynamic balance. Patient required min Vcs for correct positioning and exercise technique. Patient had a difficult time during  narrow base of support challenges.  He exhibits better SLS ability being able to progress to foam; Patient would benefit from additional skilled PT Intervention  to improve strength, balance and gait safety    Rehab Potential  Good    PT Frequency  2x / week    PT Duration  8 weeks    PT Treatment/Interventions  Manual techniques;Patient/family education;Neuromuscular re-education;Balance training;Therapeutic activities;Therapeutic exercise;Moist Heat;Cryotherapy;Ultrasound    PT Next Visit Plan  stretching hip flexors, dynamic standing balance training, add to HEP for ankle strength    PT Home Exercise Plan  single leg heel raises    Consulted and Agree with Plan of Care  Patient       Patient will benefit from skilled therapeutic intervention in order to improve the following deficits and impairments:  Decreased balance, Decreased endurance, Difficulty walking, Decreased range of motion, Impaired flexibility, Decreased activity tolerance, Decreased strength                   PT Education - 03/18/18 0812    Education Details  HEP    Person(s) Educated  Patient    Methods  Explanation    Comprehension  Verbalized understanding       PT Short Term Goals - 03/18/18 0813      PT SHORT TERM GOAL #1   Title  Patient will be independent in home exercise program to improve strength/mobility for better functional independence with ADLs.    Time  4    Period  Weeks    Status  New    Target Date  03/18/18        PT Long Term Goals - 03/18/18 0813      PT LONG TERM GOAL #1   Title  Patient will increase Berg Balance score by > 6 points to demonstrate decreased fall risk during functional activities.    Baseline  48/56: 03/18/18 50/56    Time  8    Period  Weeks    Status  Partially Met    Target Date  04/06/18      PT LONG TERM GOAL #2   Title  Patient will reduce timed up and go to <11 seconds to reduce fall risk and demonstrate improved transfer/gait ability.    Baseline  03/18/18=    Time  8    Period  Weeks    Status  Partially Met    Target Date  04/06/18      PT LONG TERM GOAL #3   Title  Patient will  increase felxibilty to B hip flexors muscles indicated wiht negative ely's test to improve overall fittness and ease of mobility.     Baseline  + ELys B LE, + ELys    Time  8    Period  Weeks    Status  Partially Met    Target Date  04/06/18  PT LONG TERM GOAL #4   Title  Patient will improve BLE ankle PF strength to be able to perform single leg heel raise x 20 without deviations to improve quality of gait and dynamic standing balance.     Baseline  03/18/18 unable to do single leg heel raise    Time  8    Period  Weeks    Status  Partially Met    Target Date  04/06/18              Patient will benefit from skilled therapeutic intervention in order to improve the following deficits and impairments:     Visit Diagnosis: Other abnormalities of gait and mobility  Muscle weakness (generalized)  Difficulty in walking, not elsewhere classified     Problem List Patient Active Problem List   Diagnosis Date Noted  . High serum ferritin 12/31/2017  . Medication monitoring encounter 07/03/2017  . Hyperglycemia 11/28/2015  . Hyperlipidemia 04/24/2015  . Chronic kidney disease (CKD), stage II (mild) 09/20/2014  . Chronic LBP 09/20/2014  . Diverticulosis of colon 09/20/2014  . Failure of erection 09/20/2014  . Gout 09/20/2014  . Chronic pain of right knee 09/20/2014  . Alcohol abuse 09/20/2014  . Nondependent alcohol abuse, episodic drinking behavior 07/10/2008  . Decreased libido 10/20/2007  . Benign essential HTN 06/18/2007  . Combined fat and carbohydrate induced hyperlipemia 06/18/2007    Alanson Puls, [place] DPT 03/18/2018, 8:16 AM  [place] MAIN [facility] SERVICES [address] [place], [place], [address] Phone: [phone]   Fax:  [phone]  Name: HUTCH RHETT MRN: [id number] Date of Birth: Sep 29, 1945

## 2018-03-19 ENCOUNTER — Other Ambulatory Visit: Payer: Self-pay | Admitting: Family Medicine

## 2018-03-23 ENCOUNTER — Ambulatory Visit: Payer: Medicare HMO | Admitting: Physical Therapy

## 2018-03-25 ENCOUNTER — Ambulatory Visit: Payer: Medicare HMO | Admitting: Physical Therapy

## 2018-03-30 ENCOUNTER — Ambulatory Visit: Payer: Medicare HMO | Admitting: Physical Therapy

## 2018-03-31 ENCOUNTER — Encounter: Payer: Self-pay | Admitting: Family Medicine

## 2018-03-31 ENCOUNTER — Ambulatory Visit (INDEPENDENT_AMBULATORY_CARE_PROVIDER_SITE_OTHER): Payer: Medicare HMO | Admitting: Family Medicine

## 2018-03-31 VITALS — BP 150/78 | HR 99 | Temp 97.9°F | Ht 71.0 in | Wt 202.1 lb

## 2018-03-31 DIAGNOSIS — F1010 Alcohol abuse, uncomplicated: Secondary | ICD-10-CM | POA: Diagnosis not present

## 2018-03-31 DIAGNOSIS — M1A0710 Idiopathic chronic gout, right ankle and foot, without tophus (tophi): Secondary | ICD-10-CM | POA: Diagnosis not present

## 2018-03-31 DIAGNOSIS — I10 Essential (primary) hypertension: Secondary | ICD-10-CM | POA: Diagnosis not present

## 2018-03-31 DIAGNOSIS — R2681 Unsteadiness on feet: Secondary | ICD-10-CM

## 2018-03-31 MED ORDER — ALLOPURINOL 300 MG PO TABS: 150.0000 mg | ORAL_TABLET | Freq: Every day | ORAL | Status: DC

## 2018-03-31 MED ORDER — OMEGA 3 1000 MG PO CAPS: 3.0000 | ORAL_CAPSULE | Freq: Every day | ORAL | Status: AC

## 2018-03-31 MED ORDER — ALLOPURINOL 100 MG PO TABS: 100.0000 mg | ORAL_TABLET | Freq: Every day | ORAL | 0 refills | Status: DC

## 2018-03-31 NOTE — Assessment & Plan Note (Signed)
Encouraged patient to continue to cut back

## 2018-03-31 NOTE — Telephone Encounter (Signed)
Copied from CRM [id number]. Topic: General - Other >> Mar 31, 2018  2:20 PM Percival SpanishKennedy, Cheryl W wrote:  Pt said Dr Sherie DonLada told him to call and let her know what medicine he has  2 bottles of the Allopurinol 300 mg and you he a bottle and a half  Losartan has 2 full bottles one is 100 mg 50mg 

## 2018-03-31 NOTE — Assessment & Plan Note (Signed)
Reviewed last few BP readings; not sure why today's readings seem off; he will try to be sure to be more compliant; limit salt / sodium; try DASH guidelines

## 2018-03-31 NOTE — Patient Instructions (Addendum)
Call me back later today and let me know the strength of your allopurinol so we can adjust that I do [facility] Phone: [phone] Monitor your blood pressure a couple of times a week and let me know your readings  Try to follow the DASH guidelines (DASH stands for Dietary Approaches to Stop Hypertension). Try to limit the sodium in your diet to no more than 1,500mg  of sodium per day. Certainly try to not exceed 2,000 mg per day at the very most. Do not add salt when cooking or at the table.  Check the sodium amount on labels when shopping, and choose items lower in sodium when given a choice. Avoid or limit foods that already contain a lot of sodium. Eat a diet rich in fruits and vegetables and whole grains, and try to lose weight if overweight or obese Limit alcohol to no more than two drinks per 24 hours

## 2018-03-31 NOTE — Telephone Encounter (Signed)
Copied from CRM #[id number]. Topic: General - Other >> Mar 31, 2018  2:20 PM Kennedy, Cheryl W wrote:  Pt said Dr Lada told him to call and let her know what medicine he has  2 bottles of the Allopurinol 300 mg and you he a bottle and a half  Losartan has 2 full bottles one is 100 mg 50mg 

## 2018-03-31 NOTE — Telephone Encounter (Signed)
Please tell him I said thank you Have him take one-half of the allopurinol 300 mg (to equal 150 mg) daily Continue losartan 50 mg daily Thank you

## 2018-03-31 NOTE — Progress Notes (Signed)
BP (!) 150/78   Pulse 99   Temp 97.9 F (36.6 C) (Oral)   Ht 5\' 11"  (1.803 m)   Wt 202 lb 1.6 oz (91.7 kg)   SpO2 99%   BMI 28.19 kg/m    Subjective:    Patient ID: Lonnie Huber, male    DOB: Sep 11, 1945, 72 y.o.   MRN: [id number]  HPI: Lonnie Huber is a 72 y.o. male  Chief Complaint  Patient presents with  . Follow-up   Vitals:   03/31/18 1027 03/31/18 1034  BP: (!) 148/88 (!) 150/78  Pulse: 99   Temp: 97.9 F (36.6 C)   SpO2: 99%     HPI Patient is here for f/u  Blood pressure was high at the outside, systolic only came down minimally with 7 minutes of rest; he is taking losartan 50 mg daily; not sure if maybe an occasional dose; took his pills this morning; ran out of money and did not get the allopurinol; he has been off of the allopurinol a few days  BP Readings from Last 3 Encounters:  03/31/18 (!) 150/78  03/03/18 128/82  01/29/18 130/80   He was working with the therapist; he had to stop therapy because it started bothering his back  He feels like if he takes his time, that helps with his clumsiness; he says he can feel it in his head, grasping all the time; I referred him to neurology in October but patient says he has not gone yet  He says he is doing pretty good overall he says; once he gets to walking "I'm fine"  He had a brain MRI done in October: IMPRESSION: 1.  No acute intracranial abnormality. 2. Mild to moderate for age nonspecific white matter signal changes, most commonly due to chronic small vessel disease.   Electronically Signed   By: Odessa Fleming M.D.   On: 01/22/2018 15:18  I asked about his alcohol intake; "I've cut back on that"; he had a death in his family, the rescue squad was there, couldn't revive her; he says they didn't cover her up until police got there and he couldn't get down there to hold her; stress in the family   Depression screen [facility] 2/9 03/31/2018 03/03/2018 01/22/2018 12/29/2017 07/03/2017  Decreased Interest  0 0 0 0 0  Down, Depressed, Hopeless 1 0 0 1 0  PHQ - 2 Score 1 0 0 1 0  Altered sleeping 0 0 0 0 -  Tired, decreased energy 0 0 0 1 -  Change in appetite 0 0 0 0 -  Feeling bad or failure about yourself  0 0 0 0 -  Trouble concentrating 0 0 0 0 -  Moving slowly or fidgety/restless 0 0 0 0 -  Suicidal thoughts 0 0 0 0 -  PHQ-9 Score 1 0 0 2 -  Difficult doing work/chores Not difficult at all - - - -   Fall Risk  03/31/2018 03/03/2018 01/22/2018 12/29/2017 07/03/2017  Falls in the past year? 0 0 No Yes No  Number falls in past yr: 0 - - 2 or more -  Injury with Fall? 0 - - No -    Relevant past medical, surgical, family and social history reviewed Past Medical History:  Diagnosis Date  . Gout   . Hyperlipidemia   . Hypertension   . Thyroid disease    Past Surgical History:  Procedure Laterality Date  . COLONOSCOPY WITH PROPOFOL N/A 10/10/2016   Procedure:  COLONOSCOPY WITH PROPOFOL;  Surgeon: Wyline Mood, MD;  Location: [facility] ENDOSCOPY;  Service: Endoscopy;  Laterality: N/A;  . LUMBAR FUSION  10/12/2009  . LUMBAR LAMINECTOMY  10/12/2009  . REPLACEMENT TOTAL KNEE Right 08/2013   Family History  Problem Relation Age of Onset  . Coronary artery disease Mother   . Congestive Heart Failure Father   . Coronary artery disease Brother   . COPD Sister   . Breast cancer Sister 60  . Heart attack Cousin   . Diabetes Cousin    Social History   Tobacco Use  . Smoking status: Former Smoker    Types: Cigarettes    Last attempt to quit: 09/20/1978    Years since quitting: 39.5  . Smokeless tobacco: Never Used  Substance Use Topics  . Alcohol use: Yes    Alcohol/week: 0.0 - 21.0 standard drinks    Comment: " i drink when I want to drink' stated pt  . Drug use: No     Office Visit from 03/31/2018 in [facility]  AUDIT-C Score  4      Interim medical history since last visit reviewed. Allergies and medications reviewed  Review of Systems Per HPI unless  specifically indicated above     Objective:    BP (!) 150/78   Pulse 99   Temp 97.9 F (36.6 C) (Oral)   Ht 5\' 11"  (1.803 m)   Wt 202 lb 1.6 oz (91.7 kg)   SpO2 99%   BMI 28.19 kg/m   Wt Readings from Last 3 Encounters:  03/31/18 202 lb 1.6 oz (91.7 kg)  03/03/18 196 lb 3.2 oz (89 kg)  01/29/18 207 lb 9.6 oz (94.2 kg)    Physical Exam Constitutional:      General: He is not in acute distress.    Appearance: He is well-developed.  Eyes:     General: No scleral icterus. Cardiovascular:     Rate and Rhythm: Normal rate and regular rhythm.  Pulmonary:     Effort: Pulmonary effort is normal.     Breath sounds: Normal breath sounds.  Skin:    Coloration: Skin is not pale.  Neurological:     Mental Status: He is alert.       Assessment & Plan:   Problem List Items Addressed This Visit      Cardiovascular and Mediastinum   Benign essential HTN - Primary (Chronic)    Reviewed last few BP readings; not sure why today's readings seem off; he will try to be sure to be more compliant; limit salt / sodium; try DASH guidelines        Other   Nondependent alcohol abuse, episodic drinking behavior    Encouraged patient to continue to cut back      Gout    He is trying to limit alcohol intake; he does not have money to get the 100 mg allopurinol pills; he wishes to use some combination of the pills he has left over, but he's not sure if they are 50 mg pills or 300 mg pills or some other strength; he will call me when he gets home and we'll work around his financial difficulties       Other Visit Diagnoses    Gait instability       reviewed MRI report with patient; encouraged him to see neurologist (referred back in Oct); he cites financial reasons for not going; number given       Follow up plan: Return in  about 15 days (around 04/15/2018) for blood pressure recheck with CMA and uric acid and BMP.  An after-visit summary was printed and given to the patient at check-out.   Please see the patient instructions which may contain other information and recommendations beyond what is mentioned above in the assessment and plan.  Meds ordered this encounter  Medications  . DISCONTD: allopurinol (ZYLOPRIM) 100 MG tablet    Sig: Take 1 tablet (100 mg total) by mouth daily. For 2 weeks, then two by mouth daily    Dispense:  46 tablet    Refill:  0  . Omega 3 1000 MG CAPS    Sig: Take 3 capsules (3,000 mg total) by mouth daily.    No orders of the defined types were placed in this encounter.

## 2018-03-31 NOTE — Assessment & Plan Note (Signed)
He is trying to limit alcohol intake; he does not have money to get the 100 mg allopurinol pills; he wishes to use some combination of the pills he has left over, but he's not sure if they are 50 mg pills or 300 mg pills or some other strength; he will call me when he gets home and we'll work around his financial difficulties

## 2018-04-01 ENCOUNTER — Ambulatory Visit: Payer: Medicare HMO | Admitting: Physical Therapy

## 2018-04-01 NOTE — Telephone Encounter (Signed)
No voicemail box set up.

## 2018-04-02 MED ORDER — VITAMIN B-1 100 MG PO TABS: 100.0000 mg | ORAL_TABLET | Freq: Every day | ORAL | 0 refills | Status: DC

## 2018-04-02 NOTE — Telephone Encounter (Signed)
Pt.notified

## 2018-04-13 ENCOUNTER — Ambulatory Visit: Payer: Medicare HMO | Admitting: Physical Therapy

## 2018-04-15 ENCOUNTER — Ambulatory Visit: Payer: Medicare HMO

## 2018-04-20 ENCOUNTER — Telehealth: Payer: Self-pay

## 2018-04-20 ENCOUNTER — Ambulatory Visit: Payer: Medicare HMO | Admitting: Physical Therapy

## 2018-04-20 NOTE — Telephone Encounter (Signed)
04/20/2018 attempted to call pt's phone number 2x,vm not set-up. Called wife's phone number as per DPR. This number no longer belongs to her. Routed letter with resources to [employer].MA

## 2018-04-22 ENCOUNTER — Ambulatory Visit: Payer: Medicare HMO

## 2018-04-22 ENCOUNTER — Telehealth: Payer: Self-pay

## 2018-04-22 ENCOUNTER — Ambulatory Visit: Payer: Medicare HMO | Admitting: Physical Therapy

## 2018-04-22 VITALS — BP 144/88 | HR 76

## 2018-04-22 DIAGNOSIS — I10 Essential (primary) hypertension: Secondary | ICD-10-CM

## 2018-04-22 NOTE — Telephone Encounter (Signed)
Patient came in this morning for BP and Blood work check, however he was in a rush and unable to wait for blood work. He had to leave to go to [facility] by 9: 15 a.m. Ask patient to bring in BP medication bottles when he comes in the morning for his blood work and recheck of BP-04/23/2018. Called patient but his voicemail was not set up.

## 2018-04-22 NOTE — Progress Notes (Signed)
Patient is here for a blood pressure check. Patient denies chest pain, palpitations, shortness of breath or visual disturbances. At previous visit blood pressure was 150/78 with a heart rate of 99. Today during nurse visit first check blood pressure was 146/90. After resting for 10 minutes it was 144/88 and heart rate was 76. He does take any blood pressure medications.  Patient was suppose to have blood work and informed patient but he stated he was in a rush and could not wait. He said he will be back in the morning for blood work and recheck BP. Patient was informed to bring his medication bottles tomorrow and recheck BP again. Patient was confused about which medication he was taking he could not clarify if he was on Losartan or Losartan/HCTZ.

## 2018-04-22 NOTE — Telephone Encounter (Signed)
Pt just called back and said he will bring his bottles tomorrow to appt

## 2018-04-23 ENCOUNTER — Ambulatory Visit: Payer: Medicare HMO

## 2018-04-23 VITALS — BP 136/86 | HR 82

## 2018-04-23 DIAGNOSIS — M109 Gout, unspecified: Secondary | ICD-10-CM

## 2018-04-23 DIAGNOSIS — Z5181 Encounter for therapeutic drug level monitoring: Secondary | ICD-10-CM | POA: Diagnosis not present

## 2018-04-23 DIAGNOSIS — I10 Essential (primary) hypertension: Secondary | ICD-10-CM | POA: Diagnosis not present

## 2018-04-23 LAB — BASIC METABOLIC PANEL WITH GFR
BUN / CREAT RATIO: 5 (calc) — AB (ref 6–22)
BUN: 7 mg/dL (ref 7–25)
CO2: 28 mmol/L (ref 20–32)
Calcium: 9.9 mg/dL (ref 8.6–10.3)
Chloride: 101 mmol/L (ref 98–110)
Creat: 1.39 mg/dL — ABNORMAL HIGH (ref 0.70–1.18)
GFR, Est African American: 59 mL/min/{1.73_m2} — ABNORMAL LOW (ref >=60)
GFR, Est Non African American: 51 mL/min/{1.73_m2} — ABNORMAL LOW (ref >=60)
Glucose, Bld: 104 mg/dL — ABNORMAL HIGH (ref 65–99)
Potassium: 4.4 mmol/L (ref 3.5–5.3)
Sodium: 138 mmol/L (ref 135–146)

## 2018-04-23 LAB — URIC ACID: Uric Acid, Serum: 3 mg/dL — ABNORMAL LOW (ref 4.0–8.0)

## 2018-04-23 NOTE — Progress Notes (Signed)
Patient here for follow-up blood pressure check and labs.  He will get BMP and uric acid level today.  Patient is currently on Losartan 50mg , but since his Bp was high yesterday when he came in he states took the losartan/HCTZ 100-25mg  yesterday and today.  He is also currently taking Allopurinol 300mg  half a pill.  Patient denies any side effects and bp today is 136/86 and Pulse 82.  I consulted with Dr. Sherie Don, Patient is not supposed to being taking Losartan/HCTZ ever.  Bottle was marked with an X to notify patient.  Otherwise she states to continue current regimen as prescibed with current med list and follow-up with her for a visit in 1 week. Referral was also placed for Pharmacist to assist with his meds

## 2018-04-23 NOTE — Addendum Note (Signed)
Addended by: Marcos Eke C on: 04/23/2018 11:30 AM   Modules accepted: Orders

## 2018-04-23 NOTE — Patient Instructions (Signed)
Dr. Sherie Don states to never take Losartan/HCTZ and a X has been placed on it.  She would like for you to continue all other meds Losartan 50mg  qd and Allopurinol 300mg  half a pill daily and follow-up with her for an appointment in 1 week.       DASH Eating Plan DASH stands for "Dietary Approaches to Stop Hypertension." The DASH eating plan is a healthy eating plan that has been shown to reduce high blood pressure (hypertension). It may also reduce your risk for type 2 diabetes, heart disease, and stroke. The DASH eating plan may also help with weight loss. What are tips for following this plan?  General guidelines  Avoid eating more than 2,300 mg (milligrams) of salt (sodium) a day. If you have hypertension, you may need to reduce your sodium intake to 1,500 mg a day.  Limit alcohol intake to no more than 1 drink a day for nonpregnant women and 2 drinks a day for men. One drink equals 12 oz of beer, 5 oz of wine, or 1 oz of hard liquor.  Work with your health care provider to maintain a healthy body weight or to lose weight. Ask what an ideal weight is for you.  Get at least 30 minutes of exercise that causes your heart to beat faster (aerobic exercise) most days of the week. Activities may include walking, swimming, or biking.  Work with your health care provider or diet and nutrition specialist (dietitian) to adjust your eating plan to your individual calorie needs. Reading food labels   Check food labels for the amount of sodium per serving. Choose foods with less than 5 percent of the Daily Value of sodium. Generally, foods with less than 300 mg of sodium per serving fit into this eating plan.  To find whole grains, look for the word "whole" as the first word in the ingredient list. Shopping  Buy products labeled as "low-sodium" or "no salt added."  Buy fresh foods. Avoid canned foods and premade or frozen meals. Cooking  Avoid adding salt when cooking. Use salt-free seasonings  or herbs instead of table salt or sea salt. Check with your health care provider or pharmacist before using salt substitutes.  Do not fry foods. Cook foods using healthy methods such as baking, boiling, grilling, and broiling instead.  Cook with heart-healthy oils, such as olive, canola, soybean, or sunflower oil. Meal planning  Eat a balanced diet that includes: ? 5 or more servings of fruits and vegetables each day. At each meal, try to fill half of your plate with fruits and vegetables. ? Up to 6-8 servings of whole grains each day. ? Less than 6 oz of lean meat, poultry, or fish each day. A 3-oz serving of meat is about the same size as a deck of cards. One egg equals 1 oz. ? 2 servings of low-fat dairy each day. ? A serving of nuts, seeds, or beans 5 times each week. ? Heart-healthy fats. Healthy fats called Omega-3 fatty acids are found in foods such as flaxseeds and coldwater fish, like sardines, salmon, and mackerel.  Limit how much you eat of the following: ? Canned or prepackaged foods. ? Food that is high in trans fat, such as fried foods. ? Food that is high in saturated fat, such as fatty meat. ? Sweets, desserts, sugary drinks, and other foods with added sugar. ? Full-fat dairy products.  Do not salt foods before eating.  Try to eat at least 2 vegetarian meals  each week.  Eat more home-cooked food and less restaurant, buffet, and fast food.  When eating at a restaurant, ask that your food be prepared with less salt or no salt, if possible. What foods are recommended? The items listed may not be a complete list. Talk with your dietitian about what dietary choices are best for you. Grains Whole-grain or whole-wheat bread. Whole-grain or whole-wheat pasta. Brown rice. [place]row. Bulgur. Whole-grain and low-sodium cereals. Pita bread. Low-fat, low-sodium crackers. Whole-wheat flour tortillas. Vegetables Fresh or frozen vegetables (raw, steamed, roasted, or grilled).  Low-sodium or reduced-sodium tomato and vegetable juice. Low-sodium or reduced-sodium tomato sauce and tomato paste. Low-sodium or reduced-sodium canned vegetables. Fruits All fresh, dried, or frozen fruit. Canned fruit in natural juice (without added sugar). Meat and other protein foods Skinless chicken or [place]. Ground chicken or [place]. Pork with fat trimmed off. Fish and seafood. Egg whites. Dried beans, peas, or lentils. Unsalted nuts, nut butters, and seeds. Unsalted canned beans. Lean cuts of beef with fat trimmed off. Low-sodium, lean deli meat. Dairy Low-fat (1%) or fat-free (skim) milk. Fat-free, low-fat, or reduced-fat cheeses. Nonfat, low-sodium ricotta or cottage cheese. Low-fat or nonfat yogurt. Low-fat, low-sodium cheese. Fats and oils Soft margarine without trans fats. Vegetable oil. Low-fat, reduced-fat, or light mayonnaise and salad dressings (reduced-sodium). Canola, safflower, olive, soybean, and sunflower oils. Avocado. Seasoning and other foods Herbs. Spices. Seasoning mixes without salt. Unsalted popcorn and pretzels. Fat-free sweets. What foods are not recommended? The items listed may not be a complete list. Talk with your dietitian about what dietary choices are best for you. Grains Baked goods made with fat, such as croissants, muffins, or some breads. Dry pasta or rice meal packs. Vegetables Creamed or fried vegetables. Vegetables in a cheese sauce. Regular canned vegetables (not low-sodium or reduced-sodium). Regular canned tomato sauce and paste (not low-sodium or reduced-sodium). Regular tomato and vegetable juice (not low-sodium or reduced-sodium). Angie Fava. Olives. Fruits Canned fruit in a light or heavy syrup. Fried fruit. Fruit in cream or butter sauce. Meat and other protein foods Fatty cuts of meat. Ribs. Fried meat. Berniece Salines. Sausage. Bologna and other processed lunch meats. Salami. Fatback. Hotdogs. Bratwurst. Salted nuts and seeds. Canned beans with added  salt. Canned or smoked fish. Whole eggs or egg yolks. Chicken or [place] with skin. Dairy Whole or 2% milk, cream, and half-and-half. Whole or full-fat cream cheese. Whole-fat or sweetened yogurt. Full-fat cheese. Nondairy creamers. Whipped toppings. Processed cheese and cheese spreads. Fats and oils Butter. Stick margarine. Lard. Shortening. Ghee. Bacon fat. Tropical oils, such as coconut, palm kernel, or palm oil. Seasoning and other foods Salted popcorn and pretzels. Onion salt, garlic salt, seasoned salt, table salt, and sea salt. Worcestershire sauce. Tartar sauce. Barbecue sauce. Teriyaki sauce. Soy sauce, including reduced-sodium. Steak sauce. Canned and packaged gravies. Fish sauce. Oyster sauce. Cocktail sauce. Horseradish that you find on the shelf. Ketchup. Mustard. Meat flavorings and tenderizers. Bouillon cubes. Hot sauce and Tabasco sauce. Premade or packaged marinades. Premade or packaged taco seasonings. Relishes. Regular salad dressings. Where to find more information:  National Heart, Lung, and [place]: https://wilson-eaton.com/  American Heart Association: www.heart.org Summary  The DASH eating plan is a healthy eating plan that has been shown to reduce high blood pressure (hypertension). It may also reduce your risk for type 2 diabetes, heart disease, and stroke.  With the DASH eating plan, you should limit salt (sodium) intake to 2,300 mg a day. If you have hypertension, you may need to reduce  your sodium intake to 1,500 mg a day.  When on the DASH eating plan, aim to eat more fresh fruits and vegetables, whole grains, lean proteins, low-fat dairy, and heart-healthy fats.  Work with your health care provider or diet and nutrition specialist (dietitian) to adjust your eating plan to your individual calorie needs. This information is not intended to replace advice given to you by your health care provider. Make sure you discuss any questions you have with your health care  provider. Document Released: 03/20/2011 Document Revised: 03/24/2016 Document Reviewed: 03/24/2016 Elsevier Interactive Patient Education  2019 [employer].

## 2018-04-26 ENCOUNTER — Other Ambulatory Visit: Payer: Self-pay | Admitting: Family Medicine

## 2018-04-26 ENCOUNTER — Ambulatory Visit: Payer: Self-pay

## 2018-04-26 DIAGNOSIS — M1A0710 Idiopathic chronic gout, right ankle and foot, without tophus (tophi): Secondary | ICD-10-CM

## 2018-04-26 DIAGNOSIS — E7800 Pure hypercholesterolemia, unspecified: Secondary | ICD-10-CM

## 2018-04-26 DIAGNOSIS — I10 Essential (primary) hypertension: Secondary | ICD-10-CM

## 2018-04-26 DIAGNOSIS — Z5181 Encounter for therapeutic drug level monitoring: Secondary | ICD-10-CM

## 2018-04-26 DIAGNOSIS — N182 Chronic kidney disease, stage 2 (mild): Secondary | ICD-10-CM

## 2018-04-26 MED ORDER — ALLOPURINOL 100 MG PO TABS: 100.0000 mg | ORAL_TABLET | Freq: Every day | ORAL | 5 refills | Status: DC

## 2018-04-26 NOTE — Telephone Encounter (Signed)
It was mailed out last week.

## 2018-04-26 NOTE — Progress Notes (Signed)
Decrease allopurinol 

## 2018-04-26 NOTE — Telephone Encounter (Signed)
I think this has already been completed, but will you ensure a letter with the resources has been sent to this patient.

## 2018-04-26 NOTE — Chronic Care Management (AMB) (Signed)
  Chronic Care Management   Note  04/26/2018 Name: Lonnie Huber MRN: [id number] DOB: 02/06/1946  Lonnie Huber is a 73 year old male who sees Dr. Baruch GoutyMelinda Lada for primary care. Dr. Sherie DonLada asked the CCM team to consult the patient for assistance with chronic disease management related to medication management and affordability. Patient has a history of but not limited to HTN, CKD, Hyperlipidemia, and hyperglycemia. Referral was placed 04/23/2018. Telephone outreach to patient today to introduce CCM services.   Was unable to reach patient via telephone today. Unfortunately voice mailbox has not been set up on given number. (unsuccessful outreach #1).  Plan: Will follow-up within 3-5  business days via telephone.    Lonnie Dax E. Suzie PortelaPayne, RN, BSN Nurse Care Coordinator [facility] Care Management  [phone]

## 2018-04-27 ENCOUNTER — Ambulatory Visit: Payer: Medicare HMO | Admitting: Physical Therapy

## 2018-04-27 ENCOUNTER — Ambulatory Visit: Payer: Self-pay

## 2018-04-27 DIAGNOSIS — Z5181 Encounter for therapeutic drug level monitoring: Secondary | ICD-10-CM

## 2018-04-27 DIAGNOSIS — N182 Chronic kidney disease, stage 2 (mild): Secondary | ICD-10-CM

## 2018-04-27 DIAGNOSIS — I10 Essential (primary) hypertension: Secondary | ICD-10-CM

## 2018-04-27 DIAGNOSIS — F1010 Alcohol abuse, uncomplicated: Secondary | ICD-10-CM

## 2018-04-27 NOTE — Chronic Care Management (AMB) (Signed)
  Chronic Care Management   Note  04/27/2018 Name: BERTRAM GIPE MRN: [id number] DOB: 1945-06-11   Joseph Art Burruel is a 73 year old male who sees Dr. Vickey Huger primary care. Dr. Sherie Don asked the CCM team to consult the patient for assistance with chronic disease management related tomedication management and affordability. Patient has a history of but not limited to HTN, CKD, Hyperlipidemia, and hyperglycemia. Referral was placed1/01/2019. Telephone outreach to patient today to introduce CCM services.  Was unable to reach patient via telephone today. Unfortunately voice mailbox has not been set up on given number. (unsuccessful outreach #2).  Plan: Will follow-up within 3-5  business days via telephone.   Dania Marsan E. Suzie Portela, RN, BSN Nurse Care Coordinator [facility] Care Management  [phone]

## 2018-04-29 ENCOUNTER — Ambulatory Visit: Payer: Medicare HMO | Admitting: Physical Therapy

## 2018-04-30 ENCOUNTER — Encounter: Payer: Self-pay | Admitting: Family Medicine

## 2018-04-30 ENCOUNTER — Ambulatory Visit (INDEPENDENT_AMBULATORY_CARE_PROVIDER_SITE_OTHER): Payer: Medicare HMO | Admitting: Family Medicine

## 2018-04-30 DIAGNOSIS — N182 Chronic kidney disease, stage 2 (mild): Secondary | ICD-10-CM | POA: Diagnosis not present

## 2018-04-30 DIAGNOSIS — I10 Essential (primary) hypertension: Secondary | ICD-10-CM | POA: Diagnosis not present

## 2018-04-30 DIAGNOSIS — M1A0710 Idiopathic chronic gout, right ankle and foot, without tophus (tophi): Secondary | ICD-10-CM

## 2018-04-30 MED ORDER — ALLOPURINOL 100 MG PO TABS: 100.0000 mg | ORAL_TABLET | Freq: Every day | ORAL | 1 refills | Status: DC

## 2018-04-30 NOTE — Assessment & Plan Note (Signed)
Renal function dipped, explained he needs to cut back on allopurinol; recheck in Feb

## 2018-04-30 NOTE — Progress Notes (Signed)
BP 120/82   Pulse 99   Temp 98.1 F (36.7 C)   Ht 5\' 11"  (1.803 m)   Wt 201 lb 9.6 oz (91.4 kg)   SpO2 96%   BMI 28.12 kg/m    Subjective:    Patient ID: Lonnie Huber, male    DOB: 1945-05-26, 73 y.o.   MRN: [id number]  HPI: Lonnie Huber is a 73 y.o. male  Chief Complaint  Patient presents with  . Follow-up    HPI Here for f/u HTN; back on meds; checking BP; 120-130 range away from doctor Not adding salt to foods Has handout on DASH guidelines, will read about that  Gout On uric acid medicine allopurinol; still using 150 mg pills and taking half a day He still has 300 mg pills and is taking half of that still Not much [place]; not much gravy; he drinks alcohol, but did drink during the holidays, went to the beach and then [place]; sitting around the friends, you know...tapering off now  Depression screen [facility] 2/9 04/30/2018 03/31/2018 03/03/2018 01/22/2018 12/29/2017  Decreased Interest 0 0 0 0 0  Down, Depressed, Hopeless 0 1 0 0 1  PHQ - 2 Score 0 1 0 0 1  Altered sleeping 0 0 0 0 0  Tired, decreased energy 0 0 0 0 1  Change in appetite 0 0 0 0 0  Feeling bad or failure about yourself  0 0 0 0 0  Trouble concentrating 0 0 0 0 0  Moving slowly or fidgety/restless 0 0 0 0 0  Suicidal thoughts 0 0 0 0 0  PHQ-9 Score 0 1 0 0 2  Difficult doing work/chores Not difficult at all Not difficult at all - - -   Fall Risk  04/30/2018 03/31/2018 03/03/2018 01/22/2018 12/29/2017  Falls in the past year? 0 0 0 No Yes  Number falls in past yr: - 0 - - 2 or more  Injury with Fall? - 0 - - No    Relevant past medical, surgical, family and social history reviewed Past Medical History:  Diagnosis Date  . Gout   . Hyperlipidemia   . Hypertension   . Thyroid disease    Past Surgical History:  Procedure Laterality Date  . COLONOSCOPY WITH PROPOFOL N/A 10/10/2016   Procedure: COLONOSCOPY WITH PROPOFOL;  Surgeon: Wyline Mood, MD;  Location: [facility] ENDOSCOPY;  Service:  Endoscopy;  Laterality: N/A;  . LUMBAR FUSION  10/12/2009  . LUMBAR LAMINECTOMY  10/12/2009  . REPLACEMENT TOTAL KNEE Right 08/2013   Family History  Problem Relation Age of Onset  . Coronary artery disease Mother   . Congestive Heart Failure Father   . Coronary artery disease Brother   . COPD Sister   . Breast cancer Sister 86  . Heart attack Cousin   . Diabetes Cousin    Social History   Tobacco Use  . Smoking status: Former Smoker    Types: Cigarettes    Last attempt to quit: 09/20/1978    Years since quitting: 39.6  . Smokeless tobacco: Never Used  Substance Use Topics  . Alcohol use: Yes    Alcohol/week: 0.0 - 21.0 standard drinks    Comment: " i drink when I want to drink' stated pt  . Drug use: No     Office Visit from 04/30/2018 in [facility]  AUDIT-C Score  3      Interim medical history since last visit reviewed. Allergies and medications reviewed  Review of Systems Per HPI unless specifically indicated above     Objective:    BP 120/82   Pulse 99   Temp 98.1 F (36.7 C)   Ht 5\' 11"  (1.803 m)   Wt 201 lb 9.6 oz (91.4 kg)   SpO2 96%   BMI 28.12 kg/m   Wt Readings from Last 3 Encounters:  04/30/18 201 lb 9.6 oz (91.4 kg)  03/31/18 202 lb 1.6 oz (91.7 kg)  03/03/18 196 lb 3.2 oz (89 kg)    Physical Exam Constitutional:      General: He is not in acute distress.    Appearance: He is well-developed.  Eyes:     General: No scleral icterus. Cardiovascular:     Rate and Rhythm: Normal rate and regular rhythm.  Pulmonary:     Effort: Pulmonary effort is normal.     Breath sounds: Normal breath sounds.  Skin:    Coloration: Skin is not pale.  Neurological:     Mental Status: He is alert.     Results for orders placed or performed in visit on 04/23/18  Uric acid  Result Value Ref Range   Uric Acid, Serum 3.0 (L) 4.0 - 8.0 mg/dL  BASIC METABOLIC PANEL WITH GFR  Result Value Ref Range   Glucose, Bld 104 (H) 65 - 99 mg/dL     BUN 7 7 - 25 mg/dL   Creat 1.32 (H) 4.40 - 1.18 mg/dL   GFR, Est Non African American 51 (L) > OR = 60 mL/min/1.18m2   GFR, Est African American 59 (L) > OR = 60 mL/min/1.28m2   BUN/Creatinine Ratio 5 (L) 6 - 22 (calc)   Sodium 138 135 - 146 mmol/L   Potassium 4.4 3.5 - 5.3 mmol/L   Chloride 101 98 - 110 mmol/L   CO2 28 20 - 32 mmol/L   Calcium 9.9 8.6 - 10.3 mg/dL      Assessment & Plan:   Problem List Items Addressed This Visit      Cardiovascular and Mediastinum   Benign essential HTN (Chronic)    Better control; he'll try DASH guidelines; limiting alcohol; return in 3 months        Genitourinary   Chronic kidney disease (CKD), stage II (mild)    Renal function dipped, explained he needs to cut back on allopurinol; recheck in Feb        Other   Gout    Explained that he needs to cut back on the allopurinol; start 100 mg allopurinol, but until he gets it use just 1/4 of the 300 mg pill; avoid trigger foods, cut back on alcohol          Follow up plan: Return in about 3 months (around 07/30/2018) for follow-up visit with Dr. Sherie Don.  An after-visit summary was printed and given to the patient at check-out.  Please see the patient instructions which may contain other information and recommendations beyond what is mentioned above in the assessment and plan.  Meds ordered this encounter  Medications  . allopurinol (ZYLOPRIM) 100 MG tablet    Sig: Take 1 tablet (100 mg total) by mouth daily.    Dispense:  90 tablet    Refill:  1    Cancel the 300 mg refills if any remain    No orders of the defined types were placed in this encounter.

## 2018-04-30 NOTE — Assessment & Plan Note (Signed)
Explained that he needs to cut back on the allopurinol; start 100 mg allopurinol, but until he gets it use just 1/4 of the 300 mg pill; avoid trigger foods, cut back on alcohol

## 2018-04-30 NOTE — Patient Instructions (Signed)
Return on or just after February 10th for recheck of labs If you need something for aches or pains, try to use Tylenol (acetaminophen) instead of non-steroidals (which include Aleve, ibuprofen, Advil, Motrin, and naproxen); non-steroidals can cause long-term kidney damage Until you get the new 100 mg allopurinol pills, take just one-quarter of your 300 mg pills for now As soon as you get the 100 mg strength, don't use the 300 mg strength any more

## 2018-04-30 NOTE — Assessment & Plan Note (Signed)
Better control; he'll try DASH guidelines; limiting alcohol; return in 3 months

## 2018-05-04 ENCOUNTER — Ambulatory Visit: Payer: Medicare HMO | Admitting: Physical Therapy

## 2018-05-05 ENCOUNTER — Ambulatory Visit: Payer: Self-pay

## 2018-05-05 DIAGNOSIS — I10 Essential (primary) hypertension: Secondary | ICD-10-CM

## 2018-05-05 DIAGNOSIS — E7800 Pure hypercholesterolemia, unspecified: Secondary | ICD-10-CM

## 2018-05-05 DIAGNOSIS — R739 Hyperglycemia, unspecified: Secondary | ICD-10-CM

## 2018-05-05 DIAGNOSIS — N182 Chronic kidney disease, stage 2 (mild): Secondary | ICD-10-CM

## 2018-05-05 NOTE — Chronic Care Management (AMB) (Addendum)
  Chronic Care Management Note   Lonnie Huber is a 73 y.o. year old male who sees Lada, Janit BernMelinda P, MD for primary care. Dr Sherie DonLada asked the CCM team to consult the patient for assistance with chronic disease management related to medication management and assistance. Referral was placed 04/23/2018. Review of patient status, including review of consultants reports, relevant laboratory and other test results, and collaboration with appropriate care team members and the patient's provider was performed as part of comprehensive patient evaluation and provision of chronic care management services. Telephone outreach to patient today to introduce CCM services.     Follow Up Plan: Scheduled to meet with CCM Team 05/06/2018 at 1:30     Lonnie Noda E. Suzie PortelaPayne, RN, BSN Nurse Care Coordinator [facility] Care Management  [phone]

## 2018-05-05 NOTE — Patient Instructions (Signed)
1. Thank You for allowing the CCM (Chronic Care Management) Team to assist you with your healthcare goals!! We look forward to meeting you on 05/06/17 at 1:30 2. Please bring ALL medications to your appointment! If you have a blood sugar meter or a blood pressure monitor at home, bring those as well.  3.  Contact the CCM Team if you have any question or need to reschedule your initial visit.  CCM (Chronic Care Management) Team   Trish Fountain RN, BSN Nurse Care Coordinator  [phone]  Ruben Reason PharmD  Clinical Pharmacist  [phone]   Mr. Straw was given information about Chronic Care Management services today including:  1. CCM service includes personalized support from designated clinical staff supervised by his physician, including individualized plan of care and coordination with other care providers 2. 24/7 contact phone numbers for assistance for urgent and routine care needs. 3. Service will only be billed when office clinical staff spend 20 minutes or more in a month to coordinate care. 4. Only one practitioner may furnish and bill the service in a calendar month. 5. The patient may stop CCM services at any time (effective at the end of the month) by phone call to the office staff. 6. The patient will be responsible for cost sharing (co-pay) of up to 20% of the service fee (after annual deductible is met).  Patient agreed to services and verbal consent obtained.

## 2018-05-06 ENCOUNTER — Other Ambulatory Visit: Payer: Self-pay

## 2018-05-06 ENCOUNTER — Other Ambulatory Visit: Payer: Self-pay | Admitting: Family Medicine

## 2018-05-06 ENCOUNTER — Ambulatory Visit (INDEPENDENT_AMBULATORY_CARE_PROVIDER_SITE_OTHER): Payer: Medicare HMO

## 2018-05-06 ENCOUNTER — Ambulatory Visit: Payer: Medicare HMO | Admitting: Physical Therapy

## 2018-05-06 DIAGNOSIS — I10 Essential (primary) hypertension: Secondary | ICD-10-CM | POA: Diagnosis not present

## 2018-05-06 DIAGNOSIS — Z5181 Encounter for therapeutic drug level monitoring: Secondary | ICD-10-CM

## 2018-05-06 DIAGNOSIS — E7800 Pure hypercholesterolemia, unspecified: Secondary | ICD-10-CM

## 2018-05-06 DIAGNOSIS — N182 Chronic kidney disease, stage 2 (mild): Secondary | ICD-10-CM

## 2018-05-06 MED ORDER — ATORVASTATIN CALCIUM 40 MG PO TABS: 40.0000 mg | ORAL_TABLET | Freq: Every day | ORAL | 1 refills | Status: DC

## 2018-05-06 NOTE — Telephone Encounter (Signed)
Raynelle Fanning pharmacist asked for this to be sent

## 2018-05-06 NOTE — Patient Instructions (Addendum)
1. Please take all your medications as prescribed 2. Please pick up thiamin (Vitmain B1) and folate as recommended by Dr. Sanda Klein from any pharmacy or using your [employer] OTC benefit  Goals Addressed            This Visit's Progress   . "I need to get my meds straight" (pt-stated)       Current Barriers:  Marland Kitchen Knowledge deficit related to medication use and doses  Pharmacist Clinical Goal(s):  Over the next 14 days, patient will be 100% adherent to medications as prescribed as evidenced by [facility] report.   Interventions: . Reviewed medications in dept, provided counseling on proper use, administration, and disposal of all medications . Counseled on Medicare Advantage plan and the "Medicare Gap" . Transfer atorvastatin prescription to [facility]  Patient Self Care Activities:  . Take all medications as prescribed  *initial goal documentation            . COMPLETED: Increase water intake       Starting 05/19/16, I will increase my water intake to 3-4 glasses a day.       Thank you allowing the Chronic Care Management Team to be a part of your care!   Please call a member of the CCM (Chronic Care Management) Team with any questions or case management needs:   Vanetta Mulders, BSN Nurse Care Coordinator  [phone]  Ruben Reason, PharmD  Clinical Pharmacist  [phone]   Mr. Leavelle was given information about Chronic Care Management services today including:  1. CCM service includes personalized support from designated clinical staff supervised by his physician, including individualized plan of care and coordination with other care providers 2. 24/7 contact phone numbers for assistance for urgent and routine care needs. 3. Service will only be billed when office clinical staff spend 20 minutes or more in a month to coordinate care. 4. Only one practitioner may furnish and bill the service in a calendar month. 5. The patient may stop CCM services at any  time (effective at the end of the month) by phone call to the office staff. 6. The patient will be responsible for cost sharing (co-pay) of up to 20% of the service fee (after annual deductible is met).  Patient agreed to services and verbal consent obtained.   The patient verbalized understanding of instructions provided today and declined a print copy of patient instruction materials.

## 2018-05-06 NOTE — Chronic Care Management (AMB) (Signed)
Chronic Care Management   Note  05/06/2018 Name: Lonnie Huber MRN: [id number] DOB: March 18, 1946  Chief Complaint  Patient presents with  . Chronic Care Management    Initial office appointment    Subjective:   Does the patient  feel that his/her medications are working for him/her?  yes  Has the patient been experiencing any side effects to the medications prescribed?  no  Does the patient measure his/her own blood glucose at home?  no   Does the patient measure his/her own blood pressure at home? no   Does the patient have any problems obtaining medications due to transportation or finances?   no  Understanding of regimen: poor Understanding of indications: poor Potential of compliance: poor  Objective: Lab Results  Component Value Date   CREATININE 1.39 (H) 04/23/2018   CREATININE 1.16 01/22/2018   CREATININE 1.03 12/29/2017    Lab Results  Component Value Date   HGBA1C 5.6 12/29/2017    Lipid Panel     Component Value Date/Time   CHOL 105 12/29/2017 1237   CHOL 182 04/24/2015 1148   TRIG 53 12/29/2017 1237   HDL 34 (L) 12/29/2017 1237   HDL 58 04/24/2015 1148   CHOLHDL 3.1 12/29/2017 1237   VLDL 9 08/19/2016 0912   LDLCALC 58 12/29/2017 1237    BP Readings from Last 3 Encounters:  04/30/18 120/82  04/23/18 136/86  04/22/18 (!) 144/88    Allergies  Allergen Reactions  . Fluzone [Flu Virus Vaccine]     Medications Reviewed Today    Reviewed by Stephannie Peters, CMA (Certified Medical Assistant) on 04/30/18 at [phone]  Med List Status: <None>  Medication Order Taking? Sig Documenting Provider Last Dose Status Informant  allopurinol (ZYLOPRIM) 100 MG tablet [id number] Yes Take 1 tablet (100 mg total) by mouth daily.  Patient taking differently:  Take 50 mg by mouth daily.    Kerman Passey, MD Taking Active   atorvastatin (LIPITOR) 40 MG tablet [id number] Yes Take 1 tablet (40 mg total) by mouth daily. Kerman Passey, MD Taking Active   losartan  (COZAAR) 50 MG tablet [id number] Yes TAKE 1 TABLET EVERY DAY FOR BLOOD PRESSURE (STOP LOSARTAN/HCTZ) Lada, Janit Bern, MD Taking Active   magnesium oxide (MAG-OX) 400 MG tablet [id number] Yes One by mouth three days a day Friday (10/11) and Saturday(10/12) and Sunday (10/13), then one a day Lada, Janit Bern, MD Taking Active   Omega 3 1000 MG CAPS [id number] Yes Take 3 capsules (3,000 mg total) by mouth daily. Kerman Passey, MD Taking Active   thiamine (VITAMIN B-1) 100 MG tablet [id number] No Take 1 tablet (100 mg total) by mouth daily.  Patient not taking:  Reported on 04/30/2018   Kerman Passey, MD Not Taking Active           Assessment:   Total Number of meds:  3 prescription/ 3 vitamin   Indications for all medications: [x]  Yes       []  No  Adherence Review  []  Excellent (no doses missed/week)     []  Good (no more than 1 dose missed/week)     []  Partial (2-3 doses missed/week)     [x]  Poor (>3 doses missed/week)  Intervention  YES NO  Explanation  Needs additional therapy      Medication requires monitoring []  []    Preventative therapy   [x]  []  Anemia- patient is not taking folic acid as recommended by Dr. Sherie Don  Possible untreated condition   []  []   Synergistic therapy   []  []     Safety/Adverse Med Event      Contraindication present []  []     Unsafe medication [x]  []  Patient was splitting the incorrect allopurinol tablet (the 100mg  strength) instead of the 300mg  strength, resulting in too low of a dose; Removed the allopurinol 300mg  pill bottle from patient's medications to dispose of safely   Allergic reaction []  []     Drug interaction []  []     Excessive dose/duration []  []     Undesirable side effect []  []     Adherence      Cannot afford medication []  []     Cannot self-administer medication appropriately []  []     Does not understand directions [x]  []  Patient does not understand medication regimen as evidenced by poor adherence and confusion   Prefers not to take []  []      Product unavailable []  []     Forgets to take []  []     Other pertinent pharmacist  counseling   Medicare Advantage plans and the Medicare gap ("donut hole")   Goals Addressed            This Visit's Progress   . "I need to get my meds straight" (pt-stated)       Current Barriers:  Marland Kitchen Knowledge deficit related to medication use and doses  Pharmacist Clinical Goal(s):  Over the next 14 days, patient will be 100% adherent to medications as prescribed as evidenced by [facility] report.   Interventions: . Reviewed medications in dept, provided counseling on proper use, administration, and disposal of all medications . Counseled on Medicare Advantage plan and the "Medicare Gap" . Transfer atorvastatin prescription to [facility]  Patient Self Care Activities:  . Take all medications as prescribed  *initial goal documentation       . COMPLETED: Increase water intake       Starting 05/19/16, I will increase my water intake to 3-4 glasses a day.       Plan: Patient educated on purpose, proper use and potential adverse effects of all medications.   Provided extensive counseling on Medicare plan.    Follow up in 2 weeks  Karalee Height, PharmD Clinical Pharmacist [facility] [phone]

## 2018-05-11 ENCOUNTER — Ambulatory Visit: Payer: Medicare HMO | Admitting: Physical Therapy

## 2018-05-13 ENCOUNTER — Ambulatory Visit: Payer: Medicare HMO | Admitting: Physical Therapy

## 2018-05-18 ENCOUNTER — Ambulatory Visit: Payer: Medicare HMO | Admitting: Physical Therapy

## 2018-05-20 ENCOUNTER — Telehealth: Payer: Self-pay | Admitting: Pharmacist

## 2018-05-20 ENCOUNTER — Ambulatory Visit: Payer: Medicare HMO | Admitting: Physical Therapy

## 2018-07-30 ENCOUNTER — Ambulatory Visit: Payer: Medicare HMO | Admitting: Family Medicine

## 2018-08-04 ENCOUNTER — Ambulatory Visit (INDEPENDENT_AMBULATORY_CARE_PROVIDER_SITE_OTHER): Payer: Medicare HMO | Admitting: Family Medicine

## 2018-08-04 ENCOUNTER — Other Ambulatory Visit: Payer: Self-pay

## 2018-08-04 ENCOUNTER — Encounter: Payer: Self-pay | Admitting: Family Medicine

## 2018-08-04 DIAGNOSIS — M1A0710 Idiopathic chronic gout, right ankle and foot, without tophus (tophi): Secondary | ICD-10-CM | POA: Diagnosis not present

## 2018-08-04 DIAGNOSIS — E7800 Pure hypercholesterolemia, unspecified: Secondary | ICD-10-CM

## 2018-08-04 DIAGNOSIS — F1010 Alcohol abuse, uncomplicated: Secondary | ICD-10-CM

## 2018-08-04 DIAGNOSIS — Z5181 Encounter for therapeutic drug level monitoring: Secondary | ICD-10-CM

## 2018-08-04 DIAGNOSIS — I10 Essential (primary) hypertension: Secondary | ICD-10-CM

## 2018-08-04 DIAGNOSIS — N182 Chronic kidney disease, stage 2 (mild): Secondary | ICD-10-CM | POA: Diagnosis not present

## 2018-08-04 NOTE — Progress Notes (Signed)
There were no vitals taken for this visit.   Subjective:    Patient ID: Lonnie Huber, male    DOB: 04/01/1946, 73 y.o.   MRN: [id number]  HPI: Lonnie Huber is a 73 y.o. male  Chief Complaint  Patient presents with  . Follow-up    HPI Virtual Visit via Telephone/Video Note   I connected with the patient via:  telephone I verified that I am speaking with the correct person using two identifiers.  Call started: 11:24 am Call terminated: 11:40 am Total length of call: 16 minutes and 10 seconds   I discussed the limitations, risks, and privacy concerns of performing an evaluation and management service by telephone and the availability of in-person appointments. I explained that he/she may be responsible for charges related to this service. The patient expressed understanding and agreed to proceed.  Provider location: home, upstairs office with door closed, earphones/headset on Patient location: home Additional participants: no one  Hypertension; his BP has been okay; he needs a refill of his BP medicine; no side effects; trying to stay away from salt  High cholesterol; taking statin; had a burger last night  Gout; no problems, taking medicine; he has cut down on gravy  Alcohol intake; he has "cut back a lot"; I pressed him, "I'm not counting"; maybe a couple of drinks a week, maybe he doesn't drink at all; not consuming as much, sometimes has 2 or more; not as much as he was He has not been taking his B vitamin; he will get in touch with them right now  Reviewed his last labs in January; he was supposed to get labs done around Feb 10th  Fall Risk  08/04/2018 04/30/2018 03/31/2018 03/03/2018 01/22/2018  Falls in the past year? 0 0 0 0 No  Number falls in past yr: 0 - 0 - -  Injury with Fall? 0 - 0 - -    Relevant past medical, surgical, family and social history reviewed Past Medical History:  Diagnosis Date  . Gout   . Hyperlipidemia   . Hypertension   .  Thyroid disease    Past Surgical History:  Procedure Laterality Date  . COLONOSCOPY WITH PROPOFOL N/A 10/10/2016   Procedure: COLONOSCOPY WITH PROPOFOL;  Surgeon: Wyline MoodAnna, Kiran, MD;  [facility];  Service: Endoscopy;  Laterality: N/A;  . LUMBAR FUSION  10/12/2009  . LUMBAR LAMINECTOMY  10/12/2009  . REPLACEMENT TOTAL KNEE Right 08/2013   Family History  Problem Relation Age of Onset  . Coronary artery disease Mother   . Congestive Heart Failure Father   . Coronary artery disease Brother   . COPD Sister   . Breast cancer Sister [id number]  . Heart attack Cousin   . Diabetes Cousin    Social History   Tobacco Use  . Smoking status: Former Smoker    Types: Cigarettes    Last attempt to quit: 09/20/1978    Years since quitting: 39.8  . Smokeless tobacco: Never Used  Substance Use Topics  . Alcohol use: Yes    Alcohol/week: 0.0 - 21.0 standard drinks    Comment: " i drink when I want to drink' stated pt  . Drug use: No     [facility]  AUDIT-C Score  3      Interim medical history since last visit reviewed. Allergies and medications reviewed  Review of Systems Per HPI unless specifically indicated above     Objective:  There were no vitals taken for this visit.  Wt Readings from Last 3 Encounters:  04/30/18 201 lb 9.6 oz (91.4 kg)  03/31/18 202 lb 1.6 oz (91.7 kg)  03/03/18 196 lb 3.2 oz (89 kg)    Physical Exam Pulmonary:     Effort: No respiratory distress.  Neurological:     Mental Status: He is alert.  Psychiatric:        Speech: Speech is not rapid and pressured, delayed or slurred.     Results for orders placed or performed in visit on 04/23/18  Uric acid  Result Value Ref Range   Uric Acid, Serum 3.0 (L) 4.0 - 8.0 mg/dL  BASIC METABOLIC PANEL WITH GFR  Result Value Ref Range   Glucose, Bld 104 (H) 65 - 99 mg/dL   BUN 7 7 - 25 mg/dL   Creat 4.40 (H) 3.47 - 1.18 mg/dL   GFR, Est Non African  American 51 (L) > OR = 60 mL/min/1.7m2   GFR, Est African American 59 (L) > OR = 60 mL/min/1.45m2   BUN/Creatinine Ratio 5 (L) 6 - 22 (calc)   Sodium 138 135 - 146 mmol/L   Potassium 4.4 3.5 - 5.3 mmol/L   Chloride 101 98 - 110 mmol/L   CO2 28 20 - 32 mmol/L   Calcium 9.9 8.6 - 10.3 mg/dL      Assessment & Plan:   Problem List Items Addressed This Visit      Cardiovascular and Mediastinum   Benign essential HTN (Chronic)    Controlled; continue medicine; limit salt        Genitourinary   Chronic kidney disease (CKD), stage II (mild)    Explained the need to decrease his allopurinol, but we need to check kidneys; this was due around February 10th; explained the need to monitor kidney function; urged him to get labs done ASAP; stop allopurinol until labs are done        Other   Medication monitoring encounter    Urged him to get labs done ASAP      Hyperlipidemia (Chronic)    Encouraged him to limit fatty meats; continue statin      Relevant Orders   Lipid panel   Gout    No recent flares; explained alcohol can worsen gout, advised limiting purine rich foods; he'll stop allopurinol until labs are done, urinating well; avoid NSAIDs      Alcohol abuse    Advised him to cut back; offered substance abuse counselor, "I'm fine, I never had no problem with it"; explained alcohol affects the liver and his medicines are cleared by the liver; can be harmful to keep drinking; can worsen gout too      Relevant Orders   Hepatic function panel       Follow up plan: Return in about 3 months (around 11/03/2018) for follow-up visit with Dr. Sherie Don; labs here ASAP.  No orders of the defined types were placed in this encounter.   Orders Placed This Encounter  Procedures  . Lipid panel  . Hepatic function panel

## 2018-08-04 NOTE — Assessment & Plan Note (Addendum)
No recent flares; explained alcohol can worsen gout, advised limiting purine rich foods; he'll stop allopurinol until labs are done, urinating well; avoid NSAIDs

## 2018-08-04 NOTE — Assessment & Plan Note (Signed)
Controlled; continue medicine; limit salt

## 2018-08-04 NOTE — Assessment & Plan Note (Signed)
Advised him to cut back; offered substance abuse counselor, "I'm fine, I never had no problem with it"; explained alcohol affects the liver and his medicines are cleared by the liver; can be harmful to keep drinking; can worsen gout too

## 2018-08-04 NOTE — Assessment & Plan Note (Signed)
Encouraged him to limit fatty meats; continue statin

## 2018-08-04 NOTE — Assessment & Plan Note (Addendum)
Explained the need to decrease his allopurinol, but we need to check kidneys; this was due around February 10th; explained the need to monitor kidney function; urged him to get labs done ASAP; stop allopurinol until labs are done

## 2018-08-04 NOTE — Assessment & Plan Note (Signed)
Urged him to get labs done ASAP

## 2018-08-09 ENCOUNTER — Other Ambulatory Visit: Payer: Self-pay

## 2018-08-09 DIAGNOSIS — Z5181 Encounter for therapeutic drug level monitoring: Secondary | ICD-10-CM | POA: Diagnosis not present

## 2018-08-09 DIAGNOSIS — M1A0710 Idiopathic chronic gout, right ankle and foot, without tophus (tophi): Secondary | ICD-10-CM | POA: Diagnosis not present

## 2018-08-09 DIAGNOSIS — E7800 Pure hypercholesterolemia, unspecified: Secondary | ICD-10-CM | POA: Diagnosis not present

## 2018-08-09 DIAGNOSIS — F1010 Alcohol abuse, uncomplicated: Secondary | ICD-10-CM

## 2018-08-09 LAB — BASIC METABOLIC PANEL WITH GFR
BUN/Creatinine Ratio: 8 (calc) (ref 6–22)
BUN: 9 mg/dL (ref 7–25)
CO2: 29 mmol/L (ref 20–32)
Calcium: 9.3 mg/dL (ref 8.6–10.3)
Chloride: 103 mmol/L (ref 98–110)
Creat: 1.2 mg/dL — ABNORMAL HIGH (ref 0.70–1.18)
GFR, Est African American: 70 mL/min/{1.73_m2} (ref >=60)
GFR, Est Non African American: 60 mL/min/{1.73_m2} (ref >=60)
Glucose, Bld: 102 mg/dL — ABNORMAL HIGH (ref 65–99)
Potassium: 3.9 mmol/L (ref 3.5–5.3)
Sodium: 137 mmol/L (ref 135–146)

## 2018-08-09 LAB — LIPID PANEL
Cholesterol: 149 mg/dL (ref <200)
HDL: 63 mg/dL (ref >=40)
LDL Cholesterol (Calc): 72 mg/dL (calc)
Non-HDL Cholesterol (Calc): 86 mg/dL (calc) (ref <130)
Total CHOL/HDL Ratio: 2.4 (calc) (ref <5.0)
Triglycerides: 64 mg/dL (ref <150)

## 2018-08-09 LAB — HEPATIC FUNCTION PANEL
AG Ratio: 1.3 (calc) (ref 1.0–2.5)
ALT: 14 U/L (ref 9–46)
AST: 24 U/L (ref 10–35)
Albumin: 3.7 g/dL (ref 3.6–5.1)
Alkaline phosphatase (APISO): 60 U/L (ref 35–144)
Bilirubin, Direct: 0.3 mg/dL — ABNORMAL HIGH (ref 0.0–0.2)
Globulin: 2.9 g/dL (calc) (ref 1.9–3.7)
Indirect Bilirubin: 0.8 mg/dL (calc) (ref 0.2–1.2)
Total Bilirubin: 1.1 mg/dL (ref 0.2–1.2)
Total Protein: 6.6 g/dL (ref 6.1–8.1)

## 2018-08-09 LAB — URIC ACID: Uric Acid, Serum: 5.9 mg/dL (ref 4.0–8.0)

## 2018-08-11 ENCOUNTER — Other Ambulatory Visit: Payer: Self-pay

## 2018-08-11 MED ORDER — ATORVASTATIN CALCIUM 40 MG PO TABS: 40.0000 mg | ORAL_TABLET | Freq: Every day | ORAL | 1 refills | Status: DC

## 2018-08-11 MED ORDER — LOSARTAN POTASSIUM 50 MG PO TABS: ORAL_TABLET | ORAL | 1 refills | Status: DC

## 2018-11-01 ENCOUNTER — Other Ambulatory Visit: Payer: Self-pay

## 2018-11-01 ENCOUNTER — Encounter: Payer: Self-pay | Admitting: Nurse Practitioner

## 2018-11-01 ENCOUNTER — Ambulatory Visit (INDEPENDENT_AMBULATORY_CARE_PROVIDER_SITE_OTHER): Payer: Medicare HMO | Admitting: Nurse Practitioner

## 2018-11-01 VITALS — BP 154/90 | HR 90 | Temp 96.8°F | Resp 14 | Ht 71.0 in | Wt 205.9 lb

## 2018-11-01 DIAGNOSIS — E7800 Pure hypercholesterolemia, unspecified: Secondary | ICD-10-CM | POA: Diagnosis not present

## 2018-11-01 DIAGNOSIS — F1010 Alcohol abuse, uncomplicated: Secondary | ICD-10-CM

## 2018-11-01 DIAGNOSIS — G8929 Other chronic pain: Secondary | ICD-10-CM | POA: Diagnosis not present

## 2018-11-01 DIAGNOSIS — M5442 Lumbago with sciatica, left side: Secondary | ICD-10-CM | POA: Diagnosis not present

## 2018-11-01 DIAGNOSIS — M1A0710 Idiopathic chronic gout, right ankle and foot, without tophus (tophi): Secondary | ICD-10-CM

## 2018-11-01 DIAGNOSIS — N182 Chronic kidney disease, stage 2 (mild): Secondary | ICD-10-CM | POA: Diagnosis not present

## 2018-11-01 DIAGNOSIS — I10 Essential (primary) hypertension: Secondary | ICD-10-CM

## 2018-11-01 DIAGNOSIS — M5441 Lumbago with sciatica, right side: Secondary | ICD-10-CM | POA: Diagnosis not present

## 2018-11-01 MED ORDER — LOSARTAN POTASSIUM 50 MG PO TABS: 75.0000 mg | ORAL_TABLET | Freq: Every day | ORAL | 1 refills | Status: DC

## 2018-11-01 MED ORDER — ALLOPURINOL 100 MG PO TABS: 100.0000 mg | ORAL_TABLET | Freq: Every day | ORAL | 1 refills | Status: DC

## 2018-11-01 NOTE — Progress Notes (Signed)
Name: Lonnie Huber   MRN: [id number]    DOB: 06/06/1945   Date:11/01/2018       Progress Note  Subjective  Chief Complaint  Chief Complaint  Patient presents with  . Follow-up    HPI  Hypertension Patient is on losartan 50mg  daily.  Takes medications as prescribed with some missed doses a month, states has been trying to remember to take it daily.  He has been relatively compliant with low-salt diet.  Doesn't check blood pressure at home.  Denies chest pain, headaches, blurry vision.   Hyperlipidemia Patient rx atorvastatin 40mg  daily and fish oils Takes medications as prescribed with no missed doses a month.  Diet: tries to avoid fried foods, has cut back on red meats.  Denies myalgias Lab Results  Component Value Date   CHOL 149 08/09/2018   HDL 63 08/09/2018   LDLCALC 72 08/09/2018   TRIG 64 08/09/2018   CHOLHDL 2.4 08/09/2018   Gout Allopurinol 100mg  daily for gout prevention. Last gout flare has been over a year.   Alcohol abuse Takes thiamine 100mg  daily  Has been drinking 1-2 40 ounce of beer 2-3 days a week, hasnt been intoxicated in 2 years.   PHQ2/9: [facility] 11/01/2018 04/30/2018 03/31/2018 03/03/2018 01/22/2018  Decreased Interest 0 0 0 0 0  Down, Depressed, Hopeless 0 0 1 0 0  PHQ - 2 Score 0 0 1 0 0  Altered sleeping 0 0 0 0 0  Tired, decreased energy 0 0 0 0 0  Change in appetite 0 0 0 0 0  Feeling bad or failure about yourself  0 0 0 0 0  Trouble concentrating 0 0 0 0 0  Moving slowly or fidgety/restless 0 0 0 0 0  Suicidal thoughts 0 0 0 0 0  PHQ-9 Score 0 0 1 0 0  Difficult doing work/chores Not difficult at all Not difficult at all Not difficult at all - -     PHQ reviewed. Negative  Patient Active Problem List   Diagnosis Date Noted  . Medication monitoring encounter 07/03/2017  . Hyperglycemia 11/28/2015  . Hyperlipidemia 04/24/2015  . Chronic kidney disease (CKD), stage II (mild) 09/20/2014  . Chronic LBP  09/20/2014  . Diverticulosis of colon 09/20/2014  . Failure of erection 09/20/2014  . Gout 09/20/2014  . Chronic pain of right knee 09/20/2014  . Alcohol abuse 09/20/2014  . Nondependent alcohol abuse, episodic drinking behavior 07/10/2008  . Decreased libido 10/20/2007  . Benign essential HTN 06/18/2007  . Combined fat and carbohydrate induced hyperlipemia 06/18/2007    Past Medical History:  Diagnosis Date  . Gout   . Hyperlipidemia   . Hypertension   . Thyroid disease     Past Surgical History:  Procedure Laterality Date  . COLONOSCOPY WITH PROPOFOL N/A 10/10/2016   Procedure: COLONOSCOPY WITH PROPOFOL;  Surgeon: Wyline MoodAnna, Kiran, MD;  [facility];  Service: Endoscopy;  Laterality: N/A;  . LUMBAR FUSION  10/12/2009  . LUMBAR LAMINECTOMY  10/12/2009  . REPLACEMENT TOTAL KNEE Right 08/2013    Social History   Tobacco Use  . Smoking status: Former Smoker    Types: Cigarettes    Quit date: 09/20/1978    Years since quitting: 40.1  . Smokeless tobacco: Never Used  Substance Use Topics  . Alcohol use: Yes    Alcohol/week: 0.0 - 21.0 standard drinks    Comment: " i drink when I want to drink' stated pt  Current Outpatient Medications:  .  allopurinol (ZYLOPRIM) 100 MG tablet, Take 1 tablet (100 mg total) by mouth daily., Disp: 90 tablet, Rfl: 1 .  atorvastatin (LIPITOR) 40 MG tablet, Take 1 tablet (40 mg total) by mouth daily., Disp: 90 tablet, Rfl: 1 .  losartan (COZAAR) 50 MG tablet, TAKE 1 TABLET EVERY DAY FOR BLOOD PRESSURE (STOP LOSARTAN/HCTZ), Disp: 90 tablet, Rfl: 1 .  magnesium oxide (MAG-OX) 400 MG tablet, One by mouth three days a day Friday (10/11) and Saturday(10/12) and Sunday (10/13), then one a day, Disp: , Rfl:  .  Omega 3 1000 MG CAPS, Take 3 capsules (3,000 mg total) by mouth daily., Disp: , Rfl:  .  thiamine (VITAMIN B-1) 100 MG tablet, Take 1 tablet (100 mg total) by mouth daily., Disp: 30 tablet, Rfl: 0  Allergies  Allergen Reactions  .  Fluzone [Flu Virus Vaccine]     Review of Systems  Constitutional: Negative for chills, fever and malaise/fatigue.  HENT: Negative for congestion, sinus pain and sore throat.   Eyes: Negative for blurred vision.  Respiratory: Negative for cough and shortness of breath.   Cardiovascular: Negative for chest pain, palpitations and leg swelling.  Gastrointestinal: Negative for abdominal pain, constipation, diarrhea and nausea.  Genitourinary: Negative for dysuria.  Musculoskeletal: Negative for falls and joint pain.  Skin: Negative for rash.  Neurological: Negative for dizziness and headaches.  Endo/Heme/Allergies: Negative for polydipsia.  Psychiatric/Behavioral: The patient is not nervous/anxious and does not have insomnia.       No other specific complaints in a complete review of systems (except as listed in HPI above).  Objective  Vitals:   11/01/18 1039 11/01/18 1047  BP: (!) 160/94 (!) 154/90  Pulse: 90   Resp: 14   Temp: (!) 96.8 F (36 C)   TempSrc: Temporal   SpO2: 98%   Weight: 205 lb 14.4 oz (93.4 kg)   Height: 5\' 11"  (1.803 m)     Body mass index is 28.72 kg/m.  Nursing Note and Vital Signs reviewed.  Physical Exam Vitals signs reviewed.  Constitutional:      Appearance: He is well-developed.  HENT:     Head: Normocephalic and atraumatic.  Neck:     Musculoskeletal: Normal range of motion and neck supple.     Vascular: No carotid bruit.  Cardiovascular:     Heart sounds: Normal heart sounds.  Pulmonary:     Effort: Pulmonary effort is normal.     Breath sounds: Normal breath sounds.  Abdominal:     General: Bowel sounds are normal.     Palpations: Abdomen is soft.     Tenderness: There is no abdominal tenderness.  Musculoskeletal: Normal range of motion.  Skin:    General: Skin is warm and dry.     Capillary Refill: Capillary refill takes less than 2 seconds.  Neurological:     Mental Status: He is alert and oriented to person, place, and  time.     GCS: GCS eye subscore is 4. GCS verbal subscore is 5. GCS motor subscore is 6.     Sensory: No sensory deficit.  Psychiatric:        Speech: Speech normal.        Behavior: Behavior normal.        Thought Content: Thought content normal.        Judgment: Judgment normal.        No results found for this or any previous visit (from the past 48  hour(s)).  Assessment & Plan  1. Benign essential HTN Increase dose, BP recheck in one month  - losartan (COZAAR) 50 MG tablet; Take 1.5 tablets (75 mg total) by mouth daily.  Dispense: 135 tablet; Refill: 1  2. Chronic kidney disease (CKD), stage II (mild) On ARB, monitor at follow-up, hydration, avoids NSADs  3. Pure hypercholesterolemia Controlled, continue statin   4. Idiopathic chronic gout of right foot without tophus Discussed diet - allopurinol (ZYLOPRIM) 100 MG tablet; Take 1 tablet (100 mg total) by mouth daily.  Dispense: 90 tablet; Refill: 1  5. Chronic midline low back pain with bilateral sciatica Prn acetaminophen, stretches  6. Alcohol abuse Discussed decreasing to 1 drink twice week- overall much improved.

## 2018-11-01 NOTE — Patient Instructions (Addendum)
- We are increasing your losartan to 1.5 tablets a day- that is 75mg  daily. TELL [facility] YOU JUST WANT 30 DAY SUPPLY- and [facility]  Your goal blood pressure is less than 150 mmHg on top. Try to follow the DASH guidelines (DASH stands for Dietary Approaches to Stop Hypertension) Try to limit the sodium in your diet.  Ideally, consume less than 1.5 grams (less than 1,500mg ) per day. Do not add salt when cooking or at the table.  Check the sodium amount on labels when shopping, and choose items lower in sodium when given a choice. Avoid or limit foods that already contain a lot of sodium. Eat a diet rich in fruits and vegetables and whole grains.    High-Fiber Diet Fiber, also called dietary fiber, is a type of carbohydrate that is found in fruits, vegetables, whole grains, and beans. A high-fiber diet can have many health benefits. Your health care provider may recommend a high-fiber diet to help:  Prevent constipation. Fiber can make your bowel movements more regular.  Lower your cholesterol.  Relieve the following conditions: ? Swelling of veins in the anus (hemorrhoids). ? Swelling and irritation (inflammation) of specific areas of the digestive tract (uncomplicated diverticulosis). ? A problem of the large intestine (colon) that sometimes causes pain and diarrhea (irritable bowel syndrome, IBS).  Prevent overeating as part of a weight-loss plan.  Prevent heart disease, type 2 diabetes, and certain cancers. What is my plan? The recommended daily fiber intake in grams (g) includes:  38 g for men age 71 or younger.  30 g for men over age 71.  25 g for women age 71 or younger.  21 g for women over age 71. You can get the recommended daily intake of dietary fiber by:  Eating a variety of fruits, vegetables, grains, and beans.  Taking a fiber supplement, if it is not possible to get enough fiber through your diet. What do I need to know about a high-fiber  diet?  It is better to get fiber through food sources rather than from fiber supplements. There is not a lot of research about how effective supplements are.  Always check the fiber content on the nutrition facts label of any prepackaged food. Look for foods that contain 5 g of fiber or more per serving.  Talk with a diet and nutrition specialist (dietitian) if you have questions about specific foods that are recommended or not recommended for your medical condition, especially if those foods are not listed below.  Gradually increase how much fiber you consume. If you increase your intake of dietary fiber too quickly, you may have bloating, cramping, or gas.  Drink plenty of water. Water helps you to digest fiber. What are tips for following this plan?  Eat a wide variety of high-fiber foods.  Make sure that half of the grains that you eat each day are whole grains.  Eat breads and cereals that are made with whole-grain flour instead of refined flour or white flour.  Eat brown rice, bulgur wheat, or millet instead of white rice.  Start the day with a breakfast that is high in fiber, such as a cereal that contains 5 g of fiber or more per serving.  Use beans in place of meat in soups, salads, and pasta dishes.  Eat high-fiber snacks, such as berries, raw vegetables, nuts, and popcorn.  Choose whole fruits and vegetables instead of processed forms like juice or sauce. What  foods can I eat? Fruits Berries. Pears. Apples. Oranges. Avocado. Prunes and raisins. Dried figs. Vegetables Sweet potatoes. Spinach. Kale. Artichokes. Cabbage. Broccoli. Cauliflower. Green peas. Carrots. Squash. Grains Whole-grain breads. Multigrain cereal. Oats and oatmeal. Brown rice. Barley. Bulgur wheat. [place]. Quinoa. Bran muffins. Popcorn. Rye wafer crackers. Meats and other proteins Navy, kidney, and pinto beans. Soybeans. Split peas. Lentils. Nuts and seeds. Dairy Fiber-fortified  yogurt. Beverages Fiber-fortified soy milk. Fiber-fortified orange juice. Other foods Fiber bars. The items listed above may not be a complete list of recommended foods and beverages. Contact a dietitian for more options. What foods are not recommended? Fruits Fruit juice. Cooked, strained fruit. Vegetables Fried potatoes. Canned vegetables. Well-cooked vegetables. Grains White bread. Pasta made with refined flour. White rice. Meats and other proteins Fatty cuts of meat. Fried chicken or fried fish. Dairy Milk. Yogurt. Cream cheese. Sour cream. Fats and oils Butters. Beverages Soft drinks. Other foods Cakes and pastries. The items listed above may not be a complete list of foods and beverages to avoid. Contact a dietitian for more information. Summary  Fiber is a type of carbohydrate. It is found in fruits, vegetables, whole grains, and beans.  There are many health benefits of eating a high-fiber diet, such as preventing constipation, lowering blood cholesterol, helping with weight loss, and reducing your risk of heart disease, diabetes, and certain cancers.  Gradually increase your intake of fiber. Increasing too fast can result in cramping, bloating, and gas. Drink plenty of water while you increase your fiber.  The best sources of fiber include whole fruits and vegetables, whole grains, nuts, seeds, and beans. This information is not intended to replace advice given to you by your health care provider. Make sure you discuss any questions you have with your health care provider. Document Released: 03/31/2005 Document Revised: 02/02/2017 Document Reviewed: 02/02/2017 Elsevier Patient Education  2020 [employer].

## 2018-11-04 ENCOUNTER — Ambulatory Visit: Payer: Medicare HMO | Admitting: Family Medicine

## 2018-12-06 ENCOUNTER — Ambulatory Visit (INDEPENDENT_AMBULATORY_CARE_PROVIDER_SITE_OTHER): Payer: Medicare HMO | Admitting: Nurse Practitioner

## 2018-12-06 ENCOUNTER — Encounter: Payer: Self-pay | Admitting: Nurse Practitioner

## 2018-12-06 ENCOUNTER — Other Ambulatory Visit: Payer: Self-pay

## 2018-12-06 VITALS — BP 142/70 | HR 93 | Temp 96.9°F | Resp 14 | Ht 72.0 in | Wt 209.2 lb

## 2018-12-06 DIAGNOSIS — N182 Chronic kidney disease, stage 2 (mild): Secondary | ICD-10-CM

## 2018-12-06 DIAGNOSIS — I10 Essential (primary) hypertension: Secondary | ICD-10-CM | POA: Diagnosis not present

## 2018-12-06 NOTE — Progress Notes (Signed)
Name: Lonnie Huber   MRN: [id number]    DOB: 08/18/1945   Date:12/06/2018       Progress Note  Subjective  Chief Complaint  Chief Complaint  Patient presents with  . Follow-up    HPI  Patient is on losartan 50mg  daily.  Since last visit he has been taking his medication every day, states he only had one missed dose last week.  He has been more compliant with low-salt diet and if very proud of how he has been doing with his sodium intake.  Denies chest pain, headaches, blurry vision. BP Readings from Last 3 Encounters:  12/06/18 (!) 142/70  11/01/18 (!) 154/90  04/30/18 120/82    PHQ2/9: Depression screen PHQ 2/9 12/06/2018 11/01/2018 04/30/2018 03/31/2018 03/03/2018  Decreased Interest 0 0 0 0 0  Down, Depressed, Hopeless 0 0 0 1 0  PHQ - 2 Score 0 0 0 1 0  Altered sleeping 0 0 0 0 0  Tired, decreased energy 0 0 0 0 0  Change in appetite 0 0 0 0 0  Feeling bad or failure about yourself  0 0 0 0 0  Trouble concentrating 0 0 0 0 0  Moving slowly or fidgety/restless 0 0 0 0 0  Suicidal thoughts 0 0 0 0 0  PHQ-9 Score 0 0 0 1 0  Difficult doing work/chores Not difficult at all Not difficult at all Not difficult at all Not difficult at all -     PHQ reviewed. Negative  Patient Active Problem List   Diagnosis Date Noted  . Medication monitoring encounter 07/03/2017  . Hyperglycemia 11/28/2015  . Hyperlipidemia 04/24/2015  . Chronic kidney disease (CKD), stage II (mild) 09/20/2014  . Chronic low back pain 09/20/2014  . Diverticulosis of colon 09/20/2014  . Failure of erection 09/20/2014  . Gout 09/20/2014  . Chronic pain of right knee 09/20/2014  . Alcohol abuse 09/20/2014  . Nondependent alcohol abuse, episodic drinking behavior 07/10/2008  . Decreased libido 10/20/2007  . Benign essential HTN 06/18/2007  . Combined fat and carbohydrate induced hyperlipemia 06/18/2007    Past Medical History:  Diagnosis Date  . Gout   . Hyperlipidemia   . Hypertension   .  Thyroid disease     Past Surgical History:  Procedure Laterality Date  . COLONOSCOPY WITH PROPOFOL N/A 10/10/2016   Procedure: COLONOSCOPY WITH PROPOFOL;  Surgeon: Jonathon Bellows, MD;  Location[facility] ENDOSCOPY;  Service: Endoscopy;  Laterality: N/A;  . LUMBAR FUSION  10/12/2009  . LUMBAR LAMINECTOMY  10/12/2009  . REPLACEMENT TOTAL KNEE Right 08/2013    Social History   Tobacco Use  . Smoking status: Former Smoker    Types: Cigarettes    Quit date: 09/20/1978    Years since quitting: 40.2  . Smokeless tobacco: Never Used  Substance Use Topics  . Alcohol use: Yes    Alcohol/week: 0.0 - 21.0 standard drinks    Comment: " i drink when I want to drink' stated pt     Current Outpatient Medications:  .  allopurinol (ZYLOPRIM) 100 MG tablet, Take 1 tablet (100 mg total) by mouth daily., Disp: 90 tablet, Rfl: 1 .  atorvastatin (LIPITOR) 40 MG tablet, Take 1 tablet (40 mg total) by mouth daily., Disp: 90 tablet, Rfl: 1 .  losartan (COZAAR) 50 MG tablet, Take 1.5 tablets (75 mg total) by mouth daily., Disp: 135 tablet, Rfl: 1 .  magnesium oxide (MAG-OX) 400 MG tablet, One by mouth three days a day Friday (  10/11) and Saturday(10/12) and Sunday (10/13), then one a day, Disp: , Rfl:  .  Omega 3 1000 MG CAPS, Take 3 capsules (3,000 mg total) by mouth daily., Disp: , Rfl:  .  thiamine (VITAMIN B-1) 100 MG tablet, Take 1 tablet (100 mg total) by mouth daily., Disp: 30 tablet, Rfl: 0  Allergies  Allergen Reactions  . Fluzone [Flu Virus Vaccine]     ROS    No other specific complaints in a complete review of systems (except as listed in HPI above).  Objective  Vitals:   12/06/18 0906  BP: (!) 142/80  Pulse: 93  Resp: 14  Temp: (!) 96.9 F (36.1 C)  SpO2: 95%  Weight: 209 lb 3.2 oz (94.9 kg)  Height: 6' (1.829 m)     Body mass index is 28.37 kg/m.  Nursing Note and Vital Signs reviewed.  Physical Exam Constitutional:      Appearance: Normal appearance. He is well-developed.   HENT:     Head: Normocephalic and atraumatic.     Right Ear: Hearing normal.     Left Ear: Hearing normal.  Eyes:     Conjunctiva/sclera: Conjunctivae normal.  Cardiovascular:     Rate and Rhythm: Normal rate and regular rhythm.     Heart sounds: Normal heart sounds.  Pulmonary:     Effort: Pulmonary effort is normal.     Breath sounds: Normal breath sounds.  Musculoskeletal: Normal range of motion.  Neurological:     Mental Status: He is alert and oriented to person, place, and time.  Psychiatric:        Speech: Speech normal.        Behavior: Behavior normal. Behavior is cooperative.        Thought Content: Thought content normal.        Judgment: Judgment normal.        No results found for this or any previous visit (from the past 48 hour(s)).  Assessment & Plan  1. Benign essential HTN Due to age more permissible for higher systolic- discussed with patient will routinely monitor and consider adjusting meds in the future if needed. He will continue to work on healthy eating and slowly increasing activity.   2. Chronic kidney disease (CKD), stage II (mild) Increase hydration avoid NSAIDs

## 2019-03-08 ENCOUNTER — Ambulatory Visit (INDEPENDENT_AMBULATORY_CARE_PROVIDER_SITE_OTHER): Payer: Medicare HMO | Admitting: Family Medicine

## 2019-03-08 ENCOUNTER — Encounter: Payer: Self-pay | Admitting: Family Medicine

## 2019-03-08 ENCOUNTER — Other Ambulatory Visit: Payer: Self-pay

## 2019-03-08 VITALS — BP 134/70 | HR 88 | Temp 97.2°F | Resp 14 | Ht 72.0 in | Wt 209.2 lb

## 2019-03-08 DIAGNOSIS — I10 Essential (primary) hypertension: Secondary | ICD-10-CM | POA: Diagnosis not present

## 2019-03-08 DIAGNOSIS — F1010 Alcohol abuse, uncomplicated: Secondary | ICD-10-CM

## 2019-03-08 DIAGNOSIS — E7800 Pure hypercholesterolemia, unspecified: Secondary | ICD-10-CM

## 2019-03-08 DIAGNOSIS — M109 Gout, unspecified: Secondary | ICD-10-CM

## 2019-03-08 DIAGNOSIS — D539 Nutritional anemia, unspecified: Secondary | ICD-10-CM | POA: Diagnosis not present

## 2019-03-08 DIAGNOSIS — Z5181 Encounter for therapeutic drug level monitoring: Secondary | ICD-10-CM

## 2019-03-08 LAB — CBC WITH DIFFERENTIAL/PLATELET
Absolute Monocytes: 629 cells/uL (ref 200–950)
Basophils Absolute: 21 cells/uL (ref 0–200)
Basophils Relative: 0.4 %
Eosinophils Absolute: 161 cells/uL (ref 15–500)
Eosinophils Relative: 3.1 %
HCT: 42.9 % (ref 38.5–50.0)
Hemoglobin: 14.4 g/dL (ref 13.2–17.1)
Lymphs Abs: 1524 cells/uL (ref 850–3900)
MCH: 29.5 pg (ref 27.0–33.0)
MCHC: 33.6 g/dL (ref 32.0–36.0)
MCV: 87.9 fL (ref 80.0–100.0)
MPV: 11.4 fL (ref 7.5–12.5)
Monocytes Relative: 12.1 %
Neutro Abs: 2865 cells/uL (ref 1500–7800)
Neutrophils Relative %: 55.1 %
Platelets: 157 10*3/uL (ref 140–400)
RBC: 4.88 10*6/uL (ref 4.20–5.80)
RDW: 12.4 % (ref 11.0–15.0)
Total Lymphocyte: 29.3 %
WBC: 5.2 10*3/uL (ref 3.8–10.8)

## 2019-03-08 LAB — COMPLETE METABOLIC PANEL WITH GFR
AG Ratio: 1.3 (calc) (ref 1.0–2.5)
ALT: 39 U/L (ref 9–46)
AST: 50 U/L — ABNORMAL HIGH (ref 10–35)
Albumin: 3.8 g/dL (ref 3.6–5.1)
Alkaline phosphatase (APISO): 69 U/L (ref 35–144)
BUN/Creatinine Ratio: 9 (calc) (ref 6–22)
BUN: 12 mg/dL (ref 7–25)
CO2: 29 mmol/L (ref 20–32)
Calcium: 9.3 mg/dL (ref 8.6–10.3)
Chloride: 102 mmol/L (ref 98–110)
Creat: 1.34 mg/dL — ABNORMAL HIGH (ref 0.70–1.18)
GFR, Est African American: 61 mL/min/{1.73_m2} (ref >=60)
GFR, Est Non African American: 53 mL/min/{1.73_m2} — ABNORMAL LOW (ref >=60)
Globulin: 3 g/dL (calc) (ref 1.9–3.7)
Glucose, Bld: 106 mg/dL — ABNORMAL HIGH (ref 65–99)
Potassium: 4 mmol/L (ref 3.5–5.3)
Sodium: 138 mmol/L (ref 135–146)
Total Bilirubin: 0.8 mg/dL (ref 0.2–1.2)
Total Protein: 6.8 g/dL (ref 6.1–8.1)

## 2019-03-08 LAB — LIPID PANEL
Cholesterol: 149 mg/dL (ref <200)
HDL: 38 mg/dL — ABNORMAL LOW (ref >=40)
LDL Cholesterol (Calc): 95 mg/dL (calc)
Non-HDL Cholesterol (Calc): 111 mg/dL (calc) (ref <130)
Total CHOL/HDL Ratio: 3.9 (calc) (ref <5.0)
Triglycerides: 74 mg/dL (ref <150)

## 2019-03-08 LAB — URIC ACID: Uric Acid, Serum: 5.1 mg/dL (ref 4.0–8.0)

## 2019-03-08 MED ORDER — ALLOPURINOL 100 MG PO TABS: 100.0000 mg | ORAL_TABLET | Freq: Every day | ORAL | 1 refills | Status: DC

## 2019-03-08 MED ORDER — ATORVASTATIN CALCIUM 40 MG PO TABS: 40.0000 mg | ORAL_TABLET | Freq: Every day | ORAL | 1 refills | Status: DC

## 2019-03-08 MED ORDER — VITAMIN B-1 100 MG PO TABS: 100.0000 mg | ORAL_TABLET | Freq: Every day | ORAL | 3 refills | Status: DC

## 2019-03-08 MED ORDER — LOSARTAN POTASSIUM 100 MG PO TABS: 100.0000 mg | ORAL_TABLET | Freq: Every day | ORAL | 1 refills | Status: DC

## 2019-03-08 NOTE — Patient Instructions (Addendum)
I am going to increase you blood pressure medication a little.  Please come back to have this rechecked in 2-4 weeks.  Start taking 2 of your 50 mg losartan blood pressure pills (take 2 pills at once, daily)  I prescribed a higher dose to come from [place].

## 2019-03-08 NOTE — Progress Notes (Signed)
Name: Lonnie Huber   MRN: [id number]    DOB: 07-Oct-1945   Date:03/08/2019       Progress Note  Chief Complaint  Patient presents with  . Follow-up    3 Month Follow Up     Subjective:   Lonnie Huber is a 73 y.o. male, presents to clinic for routine follow up on the conditions listed above.  HTN:  On losartan, states he is compliant with meds daily.   No diet efforts right now No lightheadedness, hypotension, syncope. Blood pressure today is slightly elevated, repeated BP at conclusion of visit and BP controlled. 134/70, but has been elevated with OV for several months BP Readings from Last 3 Encounters:  03/08/19 (!) 148/90  12/06/18 (!) 142/70  11/01/18 (!) 154/90   Pt denies CP, SOB, exertional sx, LE edema, palpitation, Ha's, visual disturbances Dietary efforts for BP? none   HLD: lipitor 40 mg, compliant, no myaglias No diet efforts - he eats what is there and available.  Lab Results  Component Value Date   CHOL 149 08/09/2018   HDL 63 08/09/2018   LDLCALC 72 08/09/2018   TRIG 64 08/09/2018   CHOLHDL 2.4 08/09/2018  Last labs cholesterol panel optimal, LFT's were also normal  Gout: Pt states he does not remember where he had gout but it just swelled up and was very painful he has not had a flare in a long time. He states he was on as needed medication in the past and somehow he got on daily medicine, he does drink a lot of alcohol as not avoiding purines in his diet but is not having any symptoms for the past year, he states he is compliant with taking allopurinol 100 mg once daily  ETOH - decreased use due to high costs -but decreased alcohol intake he states is drinking liquor 2-3 days a week-several drinks each time, tries to stay away from beer.  He is out of thiamine supplement, says he has had a hard time finding it. He denies any paresthesias or neuropathy in his extremities, denies any indigestion, acid reflux, abdominal pain, blood in stool or melena,  dysphagia or odynophagia, denies abdominal pain.     Patient Active Problem List   Diagnosis Date Noted  . Medication monitoring encounter 07/03/2017  . Hyperglycemia 11/28/2015  . Hyperlipidemia 04/24/2015  . Chronic kidney disease (CKD), stage II (mild) 09/20/2014  . Chronic low back pain 09/20/2014  . Diverticulosis of colon 09/20/2014  . Failure of erection 09/20/2014  . Gout 09/20/2014  . Chronic pain of right knee 09/20/2014  . Alcohol abuse 09/20/2014  . Nondependent alcohol abuse, episodic drinking behavior 07/10/2008  . Decreased libido 10/20/2007  . Benign essential HTN 06/18/2007  . Combined fat and carbohydrate induced hyperlipemia 06/18/2007    Past Surgical History:  Procedure Laterality Date  . COLONOSCOPY WITH PROPOFOL N/A 10/10/2016   Procedure: COLONOSCOPY WITH PROPOFOL;  Surgeon: Wyline Mood, MD;  Location: [facility] ENDOSCOPY;  Service: Endoscopy;  Laterality: N/A;  . LUMBAR FUSION  10/12/2009  . LUMBAR LAMINECTOMY  10/12/2009  . REPLACEMENT TOTAL KNEE Right 08/2013    Family History  Problem Relation Age of Onset  . Coronary artery disease Mother   . Congestive Heart Failure Father   . Coronary artery disease Brother   . COPD Sister   . Breast cancer Sister 85  . Heart attack Cousin   . Diabetes Cousin     Social History   Socioeconomic History  .  Marital status: Married    Spouse name: Not on file  . Number of children: Not on file  . Years of education: Not on file  . Highest education level: Not on file  Occupational History  . Not on file  Social Needs  . Financial resource strain: Not on file  . Food insecurity    Worry: Not on file    Inability: Not on file  . Transportation needs    Medical: Not on file    Non-medical: Not on file  Tobacco Use  . Smoking status: Former Smoker    Types: Cigarettes    Quit date: 09/20/1978    Years since quitting: 40.4  . Smokeless tobacco: Never Used  Substance and Sexual Activity  . Alcohol use:  Yes    Alcohol/week: 0.0 - 21.0 standard drinks    Comment: " i drink when I want to drink' stated pt  . Drug use: No  . Sexual activity: Yes  Lifestyle  . Physical activity    Days per week: Not on file    Minutes per session: Not on file  . Stress: Not on file  Relationships  . Social Musicianconnections    Talks on phone: Not on file    Gets together: Not on file    Attends religious service: Not on file    Active member of club or organization: Not on file    Attends meetings of clubs or organizations: Not on file    Relationship status: Not on file  . Intimate partner violence    Fear of current or ex partner: Not on file    Emotionally abused: Not on file    Physically abused: Not on file    Forced sexual activity: Not on file  Other Topics Concern  . Not on file  Social History Narrative  . Not on file     Current Outpatient Medications:  .  allopurinol (ZYLOPRIM) 100 MG tablet, Take 1 tablet (100 mg total) by mouth daily., Disp: 90 tablet, Rfl: 1 .  atorvastatin (LIPITOR) 40 MG tablet, Take 1 tablet (40 mg total) by mouth daily., Disp: 90 tablet, Rfl: 1 .  losartan (COZAAR) 50 MG tablet, Take 1.5 tablets (75 mg total) by mouth daily., Disp: 135 tablet, Rfl: 1 .  magnesium oxide (MAG-OX) 400 MG tablet, One by mouth three days a day Friday (10/11) and Saturday(10/12) and Sunday (10/13), then one a day, Disp: , Rfl:  .  Omega 3 1000 MG CAPS, Take 3 capsules (3,000 mg total) by mouth daily., Disp: , Rfl:  .  thiamine (VITAMIN B-1) 100 MG tablet, Take 1 tablet (100 mg total) by mouth daily. (Patient not taking: Reported on 03/08/2019), Disp: 30 tablet, Rfl: 0  Allergies  Allergen Reactions  . Fluzone [Flu Virus Vaccine]     I personally reviewed active problem list, medication list, allergies, family history, social history, health maintenance, notes from last encounter, lab results, imaging with the patient/caregiver today.  Review of Systems  Constitutional: Negative.   Negative for activity change, appetite change, chills, diaphoresis, fatigue, fever and unexpected weight change.  HENT: Negative.   Eyes: Negative.   Respiratory: Negative.  Negative for cough, chest tightness, shortness of breath and wheezing.   Cardiovascular: Negative.  Negative for chest pain, palpitations and leg swelling.  Gastrointestinal: Negative.   Endocrine: Negative.   Genitourinary: Negative.   Musculoskeletal: Negative.   Skin: Negative.   Allergic/Immunologic: Negative.   Neurological: Negative.   Hematological: Negative.  Psychiatric/Behavioral: Negative.   All other systems reviewed and are negative.    Objective:    Vitals:   03/08/19 0755  BP: (!) 148/90  Pulse: (!) 105  Resp: 14  Temp: (!) 97.2 F (36.2 C)  SpO2: 98%  Weight: 209 lb 3.2 oz (94.9 kg)  Height: 6' (1.829 m)    Body mass index is 28.37 kg/m.  Physical Exam Vitals signs and nursing note reviewed.  Constitutional:      General: He is not in acute distress.    Appearance: Normal appearance. He is well-developed. He is not ill-appearing, toxic-appearing or diaphoretic.     Interventions: Face mask in place.  HENT:     Head: Normocephalic and atraumatic.     Jaw: No trismus.     Right Ear: External ear normal.     Left Ear: External ear normal.  Eyes:     General: Lids are normal. No scleral icterus.    Conjunctiva/sclera: Conjunctivae normal.     Pupils: Pupils are equal, round, and reactive to light.  Neck:     Musculoskeletal: Normal range of motion and neck supple.     Trachea: Trachea and phonation normal. No tracheal deviation.  Cardiovascular:     Rate and Rhythm: Normal rate and regular rhythm.     Pulses: Normal pulses.          Radial pulses are 2+ on the right side and 2+ on the left side.       Posterior tibial pulses are 2+ on the right side and 2+ on the left side.     Heart sounds: Normal heart sounds. No murmur. No friction rub. No gallop.   Pulmonary:      Effort: Pulmonary effort is normal. No respiratory distress.     Breath sounds: Normal breath sounds. No stridor. No wheezing, rhonchi or rales.  Abdominal:     General: Bowel sounds are normal. There is no distension.     Palpations: Abdomen is soft.     Tenderness: There is no abdominal tenderness. There is no guarding or rebound.     Comments: Protuberant abdomen  Musculoskeletal: Normal range of motion.     Right lower leg: No edema.     Left lower leg: No edema.  Skin:    General: Skin is warm and dry.     Capillary Refill: Capillary refill takes less than 2 seconds.     Coloration: Skin is not jaundiced.     Findings: No rash.     Nails: There is no clubbing.   Neurological:     Mental Status: He is alert.     Cranial Nerves: No dysarthria or facial asymmetry.     Motor: No tremor or abnormal muscle tone.     Gait: Gait normal.  Psychiatric:        Mood and Affect: Mood normal.        Speech: Speech normal.        Behavior: Behavior normal. Behavior is cooperative.       PHQ2/9: Depression screen [facility] 2/9 03/08/2019 12/06/2018 11/01/2018 04/30/2018 03/31/2018  Decreased Interest 0 0 0 0 0  Down, Depressed, Hopeless 0 0 0 0 1  PHQ - 2 Score 0 0 0 0 1  Altered sleeping 0 0 0 0 0  Tired, decreased energy 0 0 0 0 0  Change in appetite 0 0 0 0 0  Feeling bad or failure about yourself  0 0 0 0 0  Trouble concentrating 0 0 0 0 0  Moving slowly or fidgety/restless 0 0 0 0 0  Suicidal thoughts 0 0 0 0 0  PHQ-9 Score 0 0 0 0 1  Difficult doing work/chores Not difficult at all Not difficult at all Not difficult at all Not difficult at all Not difficult at all  Some recent data might be hidden    phq 9 is negative Reviewed today  Fall Risk: Fall Risk  03/08/2019 12/06/2018 11/01/2018 08/04/2018 04/30/2018  Falls in the past year? 0 0 0 0 0  Number falls in past yr: 0 0 0 0 -  Injury with Fall? 0 0 0 0 -      Functional Status Survey: Is the patient deaf or have difficulty  hearing?: No Does the patient have difficulty seeing, even when wearing glasses/contacts?: No Does the patient have difficulty concentrating, remembering, or making decisions?: No Does the patient have difficulty walking or climbing stairs?: No Does the patient have difficulty dressing or bathing?: No Does the patient have difficulty doing errands alone such as visiting a doctor's office or shopping?: No    Assessment & Plan:    1. Benign essential HTN Uncontrolled vs white coat HTN?  BP and HR improved when repeated - discussed increasing losartan from 75 to 100 mg daily and recheck in 1 month - CMP w GFR  2. Nutritional anemia Hasn't been on supplements, recheck CBC and indices - CBC w/ Diff  3. Gout, unspecified cause, unspecified chronicity, unspecified site Patient denies frequent flares cannot recall the last 1 managed on once a day allopurinol we will recheck renal function and uric acid - CMP w GFR - Uric acid - allopurinol (ZYLOPRIM) 100 MG tablet; Take 1 tablet (100 mg total) by mouth daily.  Dispense: 90 tablet; Refill: 1  4. Pure hypercholesterolemia Tolerating medications, well controlled, no side effects or myalgias - CMP w GFR - Lipid Panel - atorvastatin (LIPITOR) 40 MG tablet; Take 1 tablet (40 mg total) by mouth daily.  Dispense: 90 tablet; Refill: 1  5. Alcohol abuse Still drinking several shots of liquor 3-4 times a week, discussed gradual decrease of amount goal would be to drink occasionally and moderate amounts - CMP w GFR - CBC w/ Diff - thiamine (VITAMIN B-1) 100 MG tablet; Take 1 tablet (100 mg total) by mouth daily.  Dispense: 90 tablet; Refill: 3  6. Medication monitoring encounter - CMP w GFR - Lipid Panel - Uric acid - CBC w/ Diff - allopurinol (ZYLOPRIM) 100 MG tablet; Take 1 tablet (100 mg total) by mouth daily.  Dispense: 90 tablet; Refill: 1 - atorvastatin (LIPITOR) 40 MG tablet; Take 1 tablet (40 mg total) by mouth daily.  Dispense: 90  tablet; Refill: 1 - thiamine (VITAMIN B-1) 100 MG tablet; Take 1 tablet (100 mg total) by mouth daily.  Dispense: 90 tablet; Refill: 3   Return in about 1 month (around 04/07/2019) for bp recheck after med dose change.   Danelle Berry, PA-C 03/08/19 8:19 AM

## 2019-04-11 ENCOUNTER — Ambulatory Visit: Payer: Medicare HMO | Admitting: Family Medicine

## 2019-04-28 ENCOUNTER — Ambulatory Visit (INDEPENDENT_AMBULATORY_CARE_PROVIDER_SITE_OTHER): Payer: Medicare HMO | Admitting: Family Medicine

## 2019-04-28 ENCOUNTER — Other Ambulatory Visit: Payer: Self-pay

## 2019-04-28 ENCOUNTER — Encounter: Payer: Self-pay | Admitting: Family Medicine

## 2019-04-28 VITALS — BP 144/82 | HR 101 | Temp 97.8°F | Resp 14 | Ht 72.0 in | Wt 208.9 lb

## 2019-04-28 DIAGNOSIS — R7401 Elevation of levels of liver transaminase levels: Secondary | ICD-10-CM

## 2019-04-28 DIAGNOSIS — I10 Essential (primary) hypertension: Secondary | ICD-10-CM

## 2019-04-28 DIAGNOSIS — N183 Chronic kidney disease, stage 3 unspecified: Secondary | ICD-10-CM

## 2019-04-28 LAB — COMPLETE METABOLIC PANEL WITH GFR
AG Ratio: 1.4 (calc) (ref 1.0–2.5)
ALT: 41 U/L (ref 9–46)
AST: 32 U/L (ref 10–35)
Albumin: 3.8 g/dL (ref 3.6–5.1)
Alkaline phosphatase (APISO): 65 U/L (ref 35–144)
BUN/Creatinine Ratio: 10 (calc) (ref 6–22)
BUN: 13 mg/dL (ref 7–25)
CO2: 26 mmol/L (ref 20–32)
Calcium: 9.5 mg/dL (ref 8.6–10.3)
Chloride: 109 mmol/L (ref 98–110)
Creat: 1.33 mg/dL — ABNORMAL HIGH (ref 0.70–1.18)
GFR, Est African American: 62 mL/min/{1.73_m2} (ref >=60)
GFR, Est Non African American: 53 mL/min/{1.73_m2} — ABNORMAL LOW (ref >=60)
Globulin: 2.7 g/dL (calc) (ref 1.9–3.7)
Glucose, Bld: 85 mg/dL (ref 65–99)
Potassium: 4.4 mmol/L (ref 3.5–5.3)
Sodium: 142 mmol/L (ref 135–146)
Total Bilirubin: 0.5 mg/dL (ref 0.2–1.2)
Total Protein: 6.5 g/dL (ref 6.1–8.1)

## 2019-04-28 NOTE — Progress Notes (Signed)
Name: Lonnie Huber   MRN: [id number]    DOB: [birth year]   Date:04/28/2019       Progress Note  Chief Complaint  Patient presents with  . Follow-up  . Gout  . Hypertension  . Hyperlipidemia     Subjective:   Lonnie Huber is a 74 y.o. male, presents to clinic for routine follow up on the conditions listed above.  Pt here for blood pressure recheck. Lab OV was 6 weeks and blood pressure was elevated  BP Readings from Last 5 Encounters:  04/28/19 (!) 144/82  03/08/19 134/70  12/06/18 (!) 142/70  11/01/18 (!) 154/90  04/30/18 120/82    Meds were changed from Losartan 50 mg to losartan 100 mg Taking meds as directed w/o problems, reports he is compliant with med changes. Pt denies chest pain, SOB, dizziness, or heart palpitations.  Denies medication side effects.  BP elevated again, he does seem to have some white coat htn Renal function with last visit declined slightly, and AST was elevated to 50, up from 24 in April 2020.  Pt overall feels good, has no complaints or concerns   Patient Active Problem List   Diagnosis Date Noted  . Pure hypercholesterolemia 03/08/2019  . Nutritional anemia 03/08/2019  . Medication monitoring encounter 07/03/2017  . Hyperglycemia 11/28/2015  . Hyperlipidemia 04/24/2015  . Chronic kidney disease (CKD), stage II (mild) 09/20/2014  . Chronic low back pain 09/20/2014  . Diverticulosis of colon 09/20/2014  . Failure of erection 09/20/2014  . Gout 09/20/2014  . Chronic pain of right knee 09/20/2014  . Alcohol abuse 09/20/2014  . Nondependent alcohol abuse, episodic drinking behavior 07/10/2008  . Decreased libido 10/20/2007  . Benign essential HTN 06/18/2007  . Combined fat and carbohydrate induced hyperlipemia 06/18/2007    Past Surgical History:  Procedure Laterality Date  . COLONOSCOPY WITH PROPOFOL N/A 10/10/2016   Procedure: COLONOSCOPY WITH PROPOFOL;  Surgeon: Wyline Mood, MD;  Location: [facility] ENDOSCOPY;  Service:  Endoscopy;  Laterality: N/A;  . LUMBAR FUSION  10/12/2009  . LUMBAR LAMINECTOMY  10/12/2009  . REPLACEMENT TOTAL KNEE Right 08/2013    Family History  Problem Relation Age of Onset  . Coronary artery disease Mother   . Congestive Heart Failure Father   . Coronary artery disease Brother   . COPD Sister   . Breast cancer Sister 42  . Heart attack Cousin   . Diabetes Cousin     Social History   Socioeconomic History  . Marital status: Married    Spouse name: Not on file  . Number of children: Not on file  . Years of education: Not on file  . Highest education level: Not on file  Occupational History  . Not on file  Tobacco Use  . Smoking status: Former Smoker    Types: Cigarettes    Quit date: 09/20/1978    Years since quitting: 40.6  . Smokeless tobacco: Never Used  Substance and Sexual Activity  . Alcohol use: Yes    Alcohol/week: 0.0 - 21.0 standard drinks    Comment: " i drink when I want to drink' stated pt  . Drug use: No  . Sexual activity: Yes  Other Topics Concern  . Not on file  Social History Narrative  . Not on file   Social Determinants of Health   Financial Resource Strain:   . Difficulty of Paying Living Expenses: Not on file  Food Insecurity:   . Worried About Programme researcher, broadcasting/film/video  in the Last Year: Not on file  . Ran Out of Food in the Last Year: Not on file  Transportation Needs:   . Lack of Transportation (Medical): Not on file  . Lack of Transportation (Non-Medical): Not on file  Physical Activity:   . Days of Exercise per Week: Not on file  . Minutes of Exercise per Session: Not on file  Stress:   . Feeling of Stress : Not on file  Social Connections:   . Frequency of Communication with Friends and Family: Not on file  . Frequency of Social Gatherings with Friends and Family: Not on file  . Attends Religious Services: Not on file  . Active Member of Clubs or Organizations: Not on file  . Attends Banker Meetings: Not on file    . Marital Status: Not on file  Intimate Partner Violence:   . Fear of Current or Ex-Partner: Not on file  . Emotionally Abused: Not on file  . Physically Abused: Not on file  . Sexually Abused: Not on file     Current Outpatient Medications:  .  allopurinol (ZYLOPRIM) 100 MG tablet, Take 1 tablet (100 mg total) by mouth daily., Disp: 90 tablet, Rfl: 1 .  atorvastatin (LIPITOR) 40 MG tablet, Take 1 tablet (40 mg total) by mouth daily., Disp: 90 tablet, Rfl: 1 .  losartan (COZAAR) 100 MG tablet, Take 1 tablet (100 mg total) by mouth daily., Disp: 90 tablet, Rfl: 1 .  magnesium oxide (MAG-OX) 400 MG tablet, One by mouth three days a day Friday (10/11) and Saturday(10/12) and Sunday (10/13), then one a day, Disp: , Rfl:  .  Omega 3 1000 MG CAPS, Take 3 capsules (3,000 mg total) by mouth daily., Disp: , Rfl:  .  thiamine (VITAMIN B-1) 100 MG tablet, Take 1 tablet (100 mg total) by mouth daily., Disp: 90 tablet, Rfl: 3  Allergies  Allergen Reactions  . Fluzone [Flu Virus Vaccine]     Chart Review Today:   Review of Systems  Constitutional: Negative.   HENT: Negative.   Eyes: Negative.   Respiratory: Negative.   Cardiovascular: Negative.   Gastrointestinal: Negative.   Endocrine: Negative.   Genitourinary: Negative.   Musculoskeletal: Negative.   Skin: Negative.   Allergic/Immunologic: Negative.   Neurological: Negative.   Hematological: Negative.   Psychiatric/Behavioral: Negative.   All other systems reviewed and are negative.    Objective:    Vitals:   04/28/19 0810  BP: (!) 144/82  Pulse: (!) 101  Resp: 14  Temp: 97.8 F (36.6 C)  SpO2: 97%  Weight: 208 lb 14.4 oz (94.8 kg)  Height: 6' (1.829 m)    Body mass index is 28.33 kg/m.  Physical Exam Vitals and nursing note reviewed.  Constitutional:      General: He is not in acute distress.    Appearance: Normal appearance. He is well-developed. He is not ill-appearing, toxic-appearing or diaphoretic.      Interventions: Face mask in place.  HENT:     Head: Normocephalic and atraumatic.     Jaw: No trismus.     Right Ear: External ear normal.     Left Ear: External ear normal.  Eyes:     General: Lids are normal. No scleral icterus.    Conjunctiva/sclera: Conjunctivae normal.     Pupils: Pupils are equal, round, and reactive to light.  Neck:     Trachea: Trachea and phonation normal. No tracheal deviation.  Cardiovascular:  Rate and Rhythm: Normal rate and regular rhythm.     Pulses: Normal pulses.          Radial pulses are 2+ on the right side and 2+ on the left side.       Posterior tibial pulses are 2+ on the right side and 2+ on the left side.     Heart sounds: Normal heart sounds. No murmur. No friction rub. No gallop.   Pulmonary:     Effort: Pulmonary effort is normal. No respiratory distress.     Breath sounds: Normal breath sounds. No stridor. No wheezing, rhonchi or rales.  Abdominal:     General: Bowel sounds are normal. There is no distension.     Palpations: Abdomen is soft.     Tenderness: There is no abdominal tenderness. There is no guarding or rebound.  Musculoskeletal:        General: Normal range of motion.     Cervical back: Normal range of motion and neck supple.     Right lower leg: No edema.     Left lower leg: No edema.  Skin:    General: Skin is warm and dry.     Capillary Refill: Capillary refill takes less than 2 seconds.     Coloration: Skin is not jaundiced.     Findings: No rash.     Nails: There is no clubbing.  Neurological:     Mental Status: He is alert.     Cranial Nerves: No dysarthria or facial asymmetry.     Motor: No tremor or abnormal muscle tone.     Gait: Gait normal.  Psychiatric:        Mood and Affect: Mood normal.        Speech: Speech normal.        Behavior: Behavior normal. Behavior is cooperative.       PHQ2/9: Depression screen [facility] 2/9 04/28/2019 03/08/2019 12/06/2018 11/01/2018 04/30/2018  Decreased Interest 0 0 0 0  0  Down, Depressed, Hopeless 1 0 0 0 0  PHQ - 2 Score 1 0 0 0 0  Altered sleeping 0 0 0 0 0  Tired, decreased energy 0 0 0 0 0  Change in appetite 0 0 0 0 0  Feeling bad or failure about yourself  0 0 0 0 0  Trouble concentrating 0 0 0 0 0  Moving slowly or fidgety/restless 0 0 0 0 0  Suicidal thoughts 0 0 0 0 0  PHQ-9 Score 1 0 0 0 0  Difficult doing work/chores Not difficult at all Not difficult at all Not difficult at all Not difficult at all Not difficult at all  Some recent data might be hidden    phq 9 is neg, less than 4, reviewed  Fall Risk: Fall Risk  04/28/2019 03/08/2019 12/06/2018 11/01/2018 08/04/2018  Falls in the past year? 0 0 0 0 0  Number falls in past yr: 0 0 0 0 0  Injury with Fall? 0 0 0 0 0    Functional Status Survey: Is the patient deaf or have difficulty hearing?: No Does the patient have difficulty seeing, even when wearing glasses/contacts?: No Does the patient have difficulty concentrating, remembering, or making decisions?: No Does the patient have difficulty walking or climbing stairs?: No Does the patient have difficulty dressing or bathing?: No Does the patient have difficulty doing errands alone such as visiting a doctor's office or shopping?: No   Assessment & Plan:     ICD-10-CM  1. Benign essential HTN  I10    bp recheck, still similar to other visits, slightly elevated but not concerning for age, will con't losartan 100 mg, recheck him in 3-4 months  2. Stage 3 chronic kidney disease, unspecified whether stage 3a or 3b CKD  N18.30 CMP w GFR   recheck renal function  3. Elevated AST (SGOT)  R74.01 CMP w GFR   recheck labs      Danelle Berry, PA-C 04/28/19 8:19 AM

## 2019-06-28 ENCOUNTER — Other Ambulatory Visit: Payer: Self-pay | Admitting: Family Medicine

## 2019-06-28 NOTE — Telephone Encounter (Signed)
Requested medication (s) are due for refill today: yes  Requested medication (s) are on the active medication list: yes  Last refill:  03/08/2019  Future visit scheduled: yes  Notes to clinic:  Pt stated [name] changed him from 1 tablet to 1 1/2 tablets per day   Requested Prescriptions  Pending Prescriptions Disp Refills   losartan (COZAAR) 100 MG tablet 90 tablet 1    Sig: Take 1 tablet (100 mg total) by mouth daily.      Cardiovascular:  Angiotensin Receptor Blockers Failed - 06/28/2019 10:01 AM      Failed - Cr in normal range and within 180 days    Creat  Date Value Ref Range Status  04/28/2019 1.33 (H) 0.70 - 1.18 mg/dL Final    Comment:    For patients >76 years of age, the reference limit for Creatinine is approximately 13% higher for people identified as African-American. .           Failed - Last BP in normal range    BP Readings from Last 1 Encounters:  04/28/19 (!) 144/82          Passed - K in normal range and within 180 days    Potassium  Date Value Ref Range Status  04/28/2019 4.4 3.5 - 5.3 mmol/L Final  08/31/2013 3.5 3.5 - 5.1 mmol/L Final          Passed - Patient is not pregnant      Passed - Valid encounter within last 6 months    Recent Outpatient Visits           2 months ago Benign essential HTN   [facility] Danelle Berry, PA-C   3 months ago Benign essential HTN   [facility] Danelle Berry, PA-C   6 months ago Benign essential HTN   [facility] Cheryle Horsfall, NP   7 months ago Benign essential HTN   [facility], Percell Belt, NP   10 months ago Alcohol abuse   [facility] L[facility], Janit Bern, MD       Future Appointments             In 1 month Danelle Berry, PA-C [facility]

## 2019-06-28 NOTE — Telephone Encounter (Signed)
Medication Refill - Medication: losartan (COZAAR) 100 MG tablet   Pt stated [name] changed him from 1 tablet to 1 1/2 tablets per day.  Has the patient contacted their pharmacy? Yes.   (Agent: If no, request that the patient contact the pharmacy for the refill.) (Agent: If yes, when and what did the pharmacy advise?)  Preferred Pharmacy (with phone number or street name):   [facility] - [place], [place] - [address] Phone:  [phone]  Fax:  [phone]       Agent: Please be advised that RX refills may take up to 3 business days. We ask that you follow-up with your pharmacy.

## 2019-07-01 MED ORDER — LOSARTAN POTASSIUM 100 MG PO TABS: 100.0000 mg | ORAL_TABLET | Freq: Every day | ORAL | 3 refills | Status: DC

## 2019-07-01 NOTE — Telephone Encounter (Signed)
Pt called to follow up on refill for losartan.  Stated Sheliah Mends increased him from 1 tablet to 1 and 1/2 tablets so he will run out before his next appt on 08/26/19. Please advise.  [facility] - [place], [place] - [address] Phone:  [phone]  Fax:  [phone]

## 2019-07-01 NOTE — Telephone Encounter (Signed)
New dose

## 2019-07-01 NOTE — Addendum Note (Signed)
Addended by: Davene Costain on: 07/01/2019 04:06 PM   Modules accepted: Orders

## 2019-07-01 NOTE — Telephone Encounter (Signed)
Will need to call and clarify with patient  Nov I saw him and he was on 50 mg 1 1/2 tablets of losartan at that time and BP was not controlled He was changed to 100 mg tablet once daily and we rechecked him in January - and he should have continued 100 mg dose daily. No change to prescription - refill sent in  Pt will be contacted to clarify

## 2019-07-28 ENCOUNTER — Ambulatory Visit (INDEPENDENT_AMBULATORY_CARE_PROVIDER_SITE_OTHER): Payer: Medicare HMO

## 2019-07-28 ENCOUNTER — Other Ambulatory Visit: Payer: Self-pay

## 2019-07-28 VITALS — BP 142/82 | HR 107 | Temp 96.8°F | Resp 15 | Ht 72.0 in | Wt 207.1 lb

## 2019-07-28 DIAGNOSIS — Z0000 Encounter for general adult medical examination without abnormal findings: Secondary | ICD-10-CM | POA: Diagnosis not present

## 2019-07-28 NOTE — Patient Instructions (Signed)
Lonnie Huber , Thank you for taking time to come for your Medicare Wellness Visit. I appreciate your ongoing commitment to your health goals. Please review the following plan we discussed and let me know if I can assist you in the future.   Screening recommendations/referrals: Colonoscopy: done 10/10/16. Repeat in 2023 Recommended yearly ophthalmology/optometry visit for glaucoma screening and checkup Recommended yearly dental visit for hygiene and checkup  Vaccinations: Influenza vaccine: n/a Pneumococcal vaccine: done 05/19/16 Tdap vaccine: done 04/26/12 Shingles vaccine: Shingrix discussed. Please contact your pharmacy for coverage information.  Covid-19: done 3/13/  Advanced directives: Advance directive discussed with you today. Even though you declined this today please call our office should you change your mind and we can give you the proper paperwork for you to fill out.  Conditions/risks identified: Recommend drinking 6-8 glasses of water per day - Next appointment: Please follow up in one year for your Medicare Annual Wellness visit.    Preventive Care 9 Years and Older, Male Preventive care refers to lifestyle choices and visits with your health care provider that can promote health and wellness. What does preventive care include?  A yearly physical exam. This is also called an annual well check.  Dental exams once or twice a year.  Routine eye exams. Ask your health care provider how often you should have your eyes checked.  Personal lifestyle choices, including:  Daily care of your teeth and gums.  Regular physical activity.  Eating a healthy diet.  Avoiding tobacco and drug use.  Limiting alcohol use.  Practicing safe sex.  Taking low doses of aspirin every day.  Taking vitamin and mineral supplements as recommended by your health care provider. What happens during an annual well check? The services and screenings done by your health care provider during  your annual well check will depend on your age, overall health, lifestyle risk factors, and family history of disease. Counseling  Your health care provider may ask you questions about your:  Alcohol use.  Tobacco use.  Drug use.  Emotional well-being.  Home and relationship well-being.  Sexual activity.  Eating habits.  History of falls.  Memory and ability to understand (cognition).  Work and work Astronomer. Screening  You may have the following tests or measurements:  Height, weight, and BMI.  Blood pressure.  Lipid and cholesterol levels. These may be checked every 5 years, or more frequently if you are over 65 years old.  Skin check.  Lung cancer screening. You may have this screening every year starting at age 13 if you have a 30-pack-year history of smoking and currently smoke or have quit within the past 15 years.  Fecal occult blood test (FOBT) of the stool. You may have this test every year starting at age 52.  Flexible sigmoidoscopy or colonoscopy. You may have a sigmoidoscopy every 5 years or a colonoscopy every 10 years starting at age 21.  Prostate cancer screening. Recommendations will vary depending on your family history and other risks.  Hepatitis C blood test.  Hepatitis B blood test.  Sexually transmitted disease (STD) testing.  Diabetes screening. This is done by checking your blood sugar (glucose) after you have not eaten for a while (fasting). You may have this done every 1-3 years.  Abdominal aortic aneurysm (AAA) screening. You may need this if you are a current or former smoker.  Osteoporosis. You may be screened starting at age 57 if you are at high risk. Talk with your health care provider about  your test results, treatment options, and if necessary, the need for more tests. Vaccines  Your health care provider may recommend certain vaccines, such as:  Influenza vaccine. This is recommended every year.  Tetanus, diphtheria, and  acellular pertussis (Tdap, Td) vaccine. You may need a Td booster every 10 years.  Zoster vaccine. You may need this after age 40.  Pneumococcal 13-valent conjugate (PCV13) vaccine. One dose is recommended after age 27.  Pneumococcal polysaccharide (PPSV23) vaccine. One dose is recommended after age 74. Talk to your health care provider about which screenings and vaccines you need and how often you need them. This information is not intended to replace advice given to you by your health care provider. Make sure you discuss any questions you have with your health care provider. Document Released: 04/27/2015 Document Revised: 12/19/2015 Document Reviewed: 01/30/2015 Elsevier Interactive Patient Education  2017 [place] Prevention in the Home Falls can cause injuries. They can happen to people of all ages. There are many things you can do to make your home safe and to help prevent falls. What can I do on the outside of my home?  Regularly fix the edges of walkways and driveways and fix any cracks.  Remove anything that might make you trip as you walk through a door, such as a raised step or threshold.  Trim any bushes or trees on the path to your home.  Use bright outdoor lighting.  Clear any walking paths of anything that might make someone trip, such as rocks or tools.  Regularly check to see if handrails are loose or broken. Make sure that both sides of any steps have handrails.  Any raised decks and porches should have guardrails on the edges.  Have any leaves, snow, or ice cleared regularly.  Use sand or salt on walking paths during winter.  Clean up any spills in your garage right away. This includes oil or grease spills. What can I do in the bathroom?  Use night lights.  Install grab bars by the toilet and in the tub and shower. Do not use towel bars as grab bars.  Use non-skid mats or decals in the tub or shower.  If you need to sit down in the shower, use  a plastic, non-slip stool.  Keep the floor dry. Clean up any water that spills on the floor as soon as it happens.  Remove soap buildup in the tub or shower regularly.  Attach bath mats securely with double-sided non-slip rug tape.  Do not have throw rugs and other things on the floor that can make you trip. What can I do in the bedroom?  Use night lights.  Make sure that you have a light by your bed that is easy to reach.  Do not use any sheets or blankets that are too big for your bed. They should not hang down onto the floor.  Have a firm chair that has side arms. You can use this for support while you get dressed.  Do not have throw rugs and other things on the floor that can make you trip. What can I do in the kitchen?  Clean up any spills right away.  Avoid walking on wet floors.  Keep items that you use a lot in easy-to-reach places.  If you need to reach something above you, use a strong step stool that has a grab bar.  Keep electrical cords out of the way.  Do not use floor polish or wax  that makes floors slippery. If you must use wax, use non-skid floor wax.  Do not have throw rugs and other things on the floor that can make you trip. What can I do with my stairs?  Do not leave any items on the stairs.  Make sure that there are handrails on both sides of the stairs and use them. Fix handrails that are broken or loose. Make sure that handrails are as long as the stairways.  Check any carpeting to make sure that it is firmly attached to the stairs. Fix any carpet that is loose or worn.  Avoid having throw rugs at the top or bottom of the stairs. If you do have throw rugs, attach them to the floor with carpet tape.  Make sure that you have a light switch at the top of the stairs and the bottom of the stairs. If you do not have them, ask someone to add them for you. What else can I do to help prevent falls?  Wear shoes that:  Do not have high heels.  Have  rubber bottoms.  Are comfortable and fit you well.  Are closed at the toe. Do not wear sandals.  If you use a stepladder:  Make sure that it is fully opened. Do not climb a closed stepladder.  Make sure that both sides of the stepladder are locked into place.  Ask someone to hold it for you, if possible.  Clearly mark and make sure that you can see:  Any grab bars or handrails.  First and last steps.  Where the edge of each step is.  Use tools that help you move around (mobility aids) if they are needed. These include:  Canes.  Walkers.  Scooters.  Crutches.  Turn on the lights when you go into a dark area. Replace any light bulbs as soon as they burn out.  Set up your furniture so you have a clear path. Avoid moving your furniture around.  If any of your floors are uneven, fix them.  If there are any pets around you, be aware of where they are.  Review your medicines with your doctor. Some medicines can make you feel dizzy. This can increase your chance of falling. Ask your doctor what other things that you can do to help prevent falls. This information is not intended to replace advice given to you by your health care provider. Make sure you discuss any questions you have with your health care provider. Document Released: 01/25/2009 Document Revised: 09/06/2015 Document Reviewed: 05/05/2014 Elsevier Interactive Patient Education  2017 [employer].

## 2019-07-28 NOTE — Progress Notes (Signed)
Subjective:   Lonnie Huber is a 74 y.o. male who presents for Medicare Annual/Subsequent preventive examination.  Review of Systems:   Cardiac Risk Factors include: advanced age (>17men, >67 women);dyslipidemia;hypertension;male gender     Objective:    Vitals: BP (!) 142/82 (BP Location: Right Arm, Patient Position: Sitting, Cuff Size: Normal)   Pulse (!) 107   Temp (!) 96.8 F (36 C) (Temporal)   Resp 15   Ht 6' (1.829 m)   Wt 207 lb 1.6 oz (93.9 kg)   SpO2 95%   BMI 28.09 kg/m   Body mass index is 28.09 kg/m.  Advanced Directives 07/28/2019 02/09/2018 01/27/2018 01/06/2018 10/10/2016 08/19/2016 05/19/2016  Does Patient Have a Medical Advance Directive? No - No No No No No  Would patient like information on creating a medical advance directive? No - Patient declined No - Patient declined No - Patient declined No - Patient declined No - Patient declined - No - Patient declined    Tobacco Social History   Tobacco Use  Smoking Status Former Smoker  . Types: Cigarettes  . Quit date: 09/20/1978  . Years since quitting: 40.8  Smokeless Tobacco Never Used     Counseling given: Not Answered   Clinical Intake:  Pre-visit preparation completed: Yes  Pain : No/denies pain     BMI - recorded: 28.09 Nutritional Status: BMI 25 -29 Overweight Nutritional Risks: None Diabetes: No  How often do you need to have someone help you when you read instructions, pamphlets, or other written materials from your doctor or pharmacy?: 1 - Never  Interpreter Needed?: No  Information entered by :: Reather Littler LPN  Past Medical History:  Diagnosis Date  . Gout   . Hyperlipidemia   . Hypertension   . Thyroid disease    Past Surgical History:  Procedure Laterality Date  . COLONOSCOPY WITH PROPOFOL N/A 10/10/2016   Procedure: COLONOSCOPY WITH PROPOFOL;  Surgeon: Wyline Mood, MD;  [facility] ENDOSCOPY;  Service: Endoscopy;  Laterality: N/A;  . LUMBAR FUSION  10/12/2009  .  LUMBAR LAMINECTOMY  10/12/2009  . REPLACEMENT TOTAL KNEE Right 08/2013   Family History  Problem Relation Age of Onset  . Coronary artery disease Mother   . Congestive Heart Failure Father   . Coronary artery disease Brother   . COPD Sister   . Breast cancer Sister 49  . Heart attack Cousin   . Diabetes Cousin    Social History   Socioeconomic History  . Marital status: Married    Spouse name: Not on file  . Number of children: Not on file  . Years of education: Not on file  . Highest education level: Not on file  Occupational History  . Occupation: retired  Tobacco Use  . Smoking status: Former Smoker    Types: Cigarettes    Quit date: 09/20/1978    Years since quitting: 40.8  . Smokeless tobacco: Never Used  Substance and Sexual Activity  . Alcohol use: Yes    Alcohol/week: 0.0 - 21.0 standard drinks    Comment: " i drink when I want to drink' stated pt  . Drug use: No  . Sexual activity: Yes  Other Topics Concern  . Not on file  Social History Narrative  . Not on file   Social Determinants of Health   Financial Resource Strain: Medium Risk  . Difficulty of Paying Living Expenses: Somewhat hard  Food Insecurity: No Food Insecurity  . Worried About Programme researcher, broadcasting/film/video  in the Last Year: Never true  . Ran Out of Food in the Last Year: Never true  Transportation Needs: No Transportation Needs  . Lack of Transportation (Medical): No  . Lack of Transportation (Non-Medical): No  Physical Activity: Insufficiently Active  . Days of Exercise per Week: 3 days  . Minutes of Exercise per Session: 30 min  Stress: No Stress Concern Present  . Feeling of Stress : Not at all  Social Connections: Unknown  . Frequency of Communication with Friends and Family: Patient refused  . Frequency of Social Gatherings with Friends and Family: Patient refused  . Attends Religious Services: Patient refused  . Active Member of Clubs or Organizations: Patient refused  . Attends Tax inspector Meetings: Patient refused  . Marital Status: Married    Outpatient Encounter Medications as of 07/28/2019  Medication Sig  . allopurinol (ZYLOPRIM) 100 MG tablet Take 1 tablet (100 mg total) by mouth daily.  Marland Kitchen atorvastatin (LIPITOR) 40 MG tablet Take 1 tablet (40 mg total) by mouth daily.  Marland Kitchen losartan (COZAAR) 100 MG tablet Take 1 tablet (100 mg total) by mouth daily.  . magnesium oxide (MAG-OX) 400 MG tablet One by mouth three days a day Friday (10/11) and Saturday(10/12) and Sunday (10/13), then one a day  . Omega 3 1000 MG CAPS Take 3 capsules (3,000 mg total) by mouth daily.  Marland Kitchen thiamine (VITAMIN B-1) 100 MG tablet Take 1 tablet (100 mg total) by mouth daily.   No facility-administered encounter medications on file as of 07/28/2019.    Activities of Daily Living In your present state of health, do you have any difficulty performing the following activities: 07/28/2019 04/28/2019  Hearing? N N  Comment declines hearing aids -  Vision? Y N  Difficulty concentrating or making decisions? N N  Walking or climbing stairs? N N  Dressing or bathing? N N  Doing errands, shopping? N N  Preparing Food and eating ? N -  Using the Toilet? N -  In the past six months, have you accidently leaked urine? N -  Do you have problems with loss of bowel control? N -  Managing your Medications? N -  Managing your Finances? N -  Housekeeping or managing your Housekeeping? N -  Some recent data might be hidden    Patient Care Team: Danelle Berry, PA-C as PCP - General (Family Medicine) Morene Crocker, MD as Referring Physician (Neurology) Andee Poles, [facility] (Pharmacist)   Assessment:   This is a routine wellness examination for Lonnie Huber.  Exercise Activities and Dietary recommendations Current Exercise Habits: Home exercise routine, Type of exercise: walking, Time (Minutes): 30, Frequency (Times/Week): 3, Weekly Exercise (Minutes/Week): 90, Intensity: Mild, Exercise limited by:  orthopedic condition(s)  Goals    . "I need to get my meds straight" (pt-stated)     Current Barriers:  Marland Kitchen Knowledge deficit related to medication use and doses  Pharmacist Clinical Goal(s):  Over the next 14 days, patient will be 100% adherent to medications as prescribed as evidenced by [facility] report.   Interventions: . Reviewed medications in dept, provided counseling on proper use, administration, and disposal of all medications . Counseled on Medicare Advantage plan and the "Medicare Gap" . Transfer atorvastatin prescription to [facility]  Patient Self Care Activities:  . Take all medications as prescribed  *initial goal documentation          Fall Risk Fall Risk  07/28/2019 04/28/2019 03/08/2019 12/06/2018 11/01/2018  Falls  in the past year? 0 0 0 0 0  Number falls in past yr: 0 0 0 0 0  Injury with Fall? 0 0 0 0 0  Risk for fall due to : No Fall Risks - - - -  Follow up Falls prevention discussed - - - -   FALL RISK PREVENTION PERTAINING TO THE HOME:  Any stairs in or around the home? Yes  If so, do they handrails? Yes   Home free of loose throw rugs in walkways, pet beds, electrical cords, etc? Yes  Adequate lighting in your home to reduce risk of falls? Yes   ASSISTIVE DEVICES UTILIZED TO PREVENT FALLS:  Life alert? No  Use of a cane, walker or w/c? No  Grab bars in the bathroom? No  Shower chair or bench in shower? No  Elevated toilet seat or a handicapped toilet? Yes   DME ORDERS:  DME order needed?  No   TIMED UP AND GO:  Was the test performed? Yes .  Length of time to ambulate 10 feet: 6 sec.   GAIT:  Appearance of gait: Gait stead-fast and without the use of an assistive device.   Education: Fall risk prevention has been discussed.  Intervention(s) required? No   Depression Screen PHQ 2/9 Scores 07/28/2019 04/28/2019 03/08/2019 12/06/2018  PHQ - 2 Score 0 1 0 0  PHQ- 9 Score - 1 0 0    Cognitive Function     6CIT Screen  07/28/2019 05/19/2016  What Year? 0 points 0 points  What month? 0 points 0 points  What time? 0 points 0 points  Count back from 20 0 points 0 points  Months in reverse 4 points 4 points  Repeat phrase 0 points 8 points  Total Score 4 12    Immunization History  Administered Date(s) Administered  . Janssen (J&J) SARS-COV-2 Vaccination 06/25/2019  . Pneumococcal Conjugate-13 05/19/2016  . Pneumococcal Polysaccharide-23 12/21/2014  . Tdap 04/26/2012  . Zoster 04/26/2012    Qualifies for Shingles Vaccine? Yes  Zostavax completed 2014. Due for Shingrix. Education has been provided regarding the importance of this vaccine. Pt has been advised to call insurance company to determine out of pocket expense. Advised may also receive vaccine at local pharmacy or Health Dept. Verbalized acceptance and understanding.  Tdap: Up to date  Flu Vaccine: n/a pt is allergic to flu vaccine.  Pneumococcal Vaccine: Up to date   Screening Tests Health Maintenance  Topic Date Due  . INFLUENZA VACCINE  11/13/2019  . COLONOSCOPY  10/10/2021  . TETANUS/TDAP  04/26/2022  . Hepatitis C Screening  Completed  . PNA vac Low Risk Adult  Completed   Cancer Screenings:  Colorectal Screening: Completed 10/10/16. Repeat every 5 years;  Lung Cancer Screening: (Low Dose CT Chest recommended if Age 46-80 years, 30 pack-year currently smoking OR have quit w/in 15years.) does not qualify.    Additional Screening:  Hepatitis C Screening: does qualify; Completed 05/19/16  Vision Screening: Recommended annual ophthalmology exams for early detection of glaucoma and other disorders of the eye. Is the patient up to date with their annual eye exam?  Yes  Who is the provider or what is the name of the office in which the pt attends annual eye exams? [place] Screening: Recommended annual dental exams for proper oral hygiene  Community Resource Referral:  CRR required this visit?  No       Plan:      I have personally  reviewed and addressed the Medicare Annual Wellness questionnaire and have noted the following in the patient's chart:  A. Medical and social history B. Use of alcohol, tobacco or illicit drugs  C. Current medications and supplements D. Functional ability and status E.  Nutritional status F.  Physical activity G. Advance directives H. List of other physicians I.  Hospitalizations, surgeries, and ER visits in previous 12 months J.  Vitals K. Screenings such as hearing and vision if needed, cognitive and depression L. Referrals and appointments   In addition, I have reviewed and discussed with patient certain preventive protocols, quality metrics, and best practice recommendations. A written personalized care plan for preventive services as well as general preventive health recommendations were provided to patient.   Signed,  Reather Littler, LPN Nurse Health Advisor   Nurse Notes: none

## 2019-08-18 ENCOUNTER — Telehealth: Payer: Self-pay | Admitting: Family Medicine

## 2019-08-18 NOTE — Chronic Care Management (AMB) (Signed)
  Care Management   Note  08/18/2019 Name: Lonnie Huber MRN: [id number] DOB: 04/10/1946  Lonnie Huber is a 74 y.o. year old male who is a primary care patient of Sander Radon and is actively engaged with the care management team. I reached out to [employer] by phone today to assist with scheduling an initial visit with the Pharmacist  Follow up plan: Unsuccessful telephone outreach attempt made. A HIPPA compliant phone message was left for the patient providing contact information and requesting a return call.  The care management team will reach out to the patient again over the next 7 days.  If patient returns call to provider office, please advise to call Embedded Care Management Care Guide Penne Lash  at [phone]  Penne Lash, RMA Care Guide, Embedded Care Coordination [facility]  [place], [place] [address] Direct Dial: [phone] [email or web address] Website: [email or web address]

## 2019-08-22 NOTE — Chronic Care Management (AMB) (Signed)
  Care Management   Note  08/22/2019 Name: Lonnie Huber MRN: [id number] DOB: [birth year]  Lonnie Huber is a 74 y.o. year old male who is a primary care patient of Sander Radon and is actively engaged with the care management team. I reached out to [employer] by phone today to assist with scheduling an initial visit with the Pharmacist  Follow up plan: Second Unsuccessful telephone outreach attempt made. The care management team will reach out to the patient again over the next 7 days.  If patient returns call to provider office, please advise to call Embedded Care Management Care Guide Penne Lash  at [phone]  Penne Lash, RMA Care Guide, Embedded Care Coordination [facility]  [place], [place] [address] Direct Dial: [phone] [email or web address]: [email or web address]

## 2019-08-26 ENCOUNTER — Ambulatory Visit (INDEPENDENT_AMBULATORY_CARE_PROVIDER_SITE_OTHER): Payer: Medicare HMO | Admitting: Family Medicine

## 2019-08-26 ENCOUNTER — Other Ambulatory Visit: Payer: Self-pay

## 2019-08-26 ENCOUNTER — Encounter: Payer: Self-pay | Admitting: Family Medicine

## 2019-08-26 VITALS — BP 146/84 | HR 98 | Temp 98.2°F | Resp 18 | Ht 72.0 in | Wt 206.6 lb

## 2019-08-26 DIAGNOSIS — Z5181 Encounter for therapeutic drug level monitoring: Secondary | ICD-10-CM

## 2019-08-26 DIAGNOSIS — I10 Essential (primary) hypertension: Secondary | ICD-10-CM | POA: Diagnosis not present

## 2019-08-26 DIAGNOSIS — M109 Gout, unspecified: Secondary | ICD-10-CM | POA: Diagnosis not present

## 2019-08-26 DIAGNOSIS — E7800 Pure hypercholesterolemia, unspecified: Secondary | ICD-10-CM | POA: Diagnosis not present

## 2019-08-26 LAB — COMPLETE METABOLIC PANEL WITH GFR
AG Ratio: 1.5 (calc) (ref 1.0–2.5)
ALT: 22 U/L (ref 9–46)
AST: 25 U/L (ref 10–35)
Albumin: 3.8 g/dL (ref 3.6–5.1)
Alkaline phosphatase (APISO): 71 U/L (ref 35–144)
BUN/Creatinine Ratio: 9 (calc) (ref 6–22)
BUN: 11 mg/dL (ref 7–25)
CO2: 29 mmol/L (ref 20–32)
Calcium: 9.4 mg/dL (ref 8.6–10.3)
Chloride: 100 mmol/L (ref 98–110)
Creat: 1.26 mg/dL — ABNORMAL HIGH (ref 0.70–1.18)
GFR, Est African American: 66 mL/min/{1.73_m2} (ref >=60)
GFR, Est Non African American: 57 mL/min/{1.73_m2} — ABNORMAL LOW (ref >=60)
Globulin: 2.5 g/dL (calc) (ref 1.9–3.7)
Glucose, Bld: 98 mg/dL (ref 65–99)
Potassium: 4.4 mmol/L (ref 3.5–5.3)
Sodium: 136 mmol/L (ref 135–146)
Total Bilirubin: 1.4 mg/dL — ABNORMAL HIGH (ref 0.2–1.2)
Total Protein: 6.3 g/dL (ref 6.1–8.1)

## 2019-08-26 LAB — LIPID PANEL
Cholesterol: 123 mg/dL (ref <200)
HDL: 57 mg/dL (ref >=40)
LDL Cholesterol (Calc): 53 mg/dL (calc)
Non-HDL Cholesterol (Calc): 66 mg/dL (calc) (ref <130)
Total CHOL/HDL Ratio: 2.2 (calc) (ref <5.0)
Triglycerides: 52 mg/dL (ref <150)

## 2019-08-26 MED ORDER — ALLOPURINOL 100 MG PO TABS: 100.0000 mg | ORAL_TABLET | Freq: Every day | ORAL | 3 refills | Status: DC

## 2019-08-26 MED ORDER — ATORVASTATIN CALCIUM 40 MG PO TABS: 40.0000 mg | ORAL_TABLET | Freq: Every day | ORAL | 3 refills | Status: DC

## 2019-08-26 NOTE — Patient Instructions (Signed)
   Managing Your Hypertension Hypertension is commonly called high blood pressure. This is when the force of your blood pressing against the walls of your arteries is too strong. Arteries are blood vessels that carry blood from your heart throughout your body. Hypertension forces the heart to work harder to pump blood, and may cause the arteries to become narrow or stiff. Having untreated or uncontrolled hypertension can cause heart attack, stroke, kidney disease, and other problems. What are blood pressure readings? A blood pressure reading consists of a higher number over a lower number. Ideally, your blood pressure should be below 120/80. The first ("top") number is called the systolic pressure. It is a measure of the pressure in your arteries as your heart beats. The second ("bottom") number is called the diastolic pressure. It is a measure of the pressure in your arteries as the heart relaxes. What does my blood pressure reading mean? Blood pressure is classified into four stages. Based on your blood pressure reading, your health care provider may use the following stages to determine what type of treatment you need, if any. Systolic pressure and diastolic pressure are measured in a unit called mm Hg. Normal  Systolic pressure: below 120.  Diastolic pressure: below 80. Elevated  Systolic pressure: 120-129.  Diastolic pressure: below 80. Hypertension stage 1  Systolic pressure: 130-139.  Diastolic pressure: 80-89. Hypertension stage 2  Systolic pressure: 140 or above.  Diastolic pressure: 90 or above. What health risks are associated with hypertension? Managing your hypertension is an important responsibility. Uncontrolled hypertension can lead to:  A heart attack.  A stroke.  A weakened blood vessel (aneurysm).  Heart failure.  Kidney damage.  Eye damage.  Metabolic syndrome.  Memory and concentration problems. What changes can I make to manage my  hypertension? Hypertension can be managed by making lifestyle changes and possibly by taking medicines. Your health care provider will help you make a plan to bring your blood pressure within a normal range. Eating and drinking   Eat a diet that is high in fiber and potassium, and low in salt (sodium), added sugar, and fat. An example eating plan is called the DASH (Dietary Approaches to Stop Hypertension) diet. To eat this way: ? Eat plenty of fresh fruits and vegetables. Try to fill half of your plate at each meal with fruits and vegetables. ? Eat whole grains, such as whole wheat pasta, brown rice, or whole grain bread. Fill about one quarter of your plate with whole grains. ? Eat low-fat diary products. ? Avoid fatty cuts of meat, processed or cured meats, and poultry with skin. Fill about one quarter of your plate with lean proteins such as fish, chicken without skin, beans, eggs, and tofu. ? Avoid premade and processed foods. These tend to be higher in sodium, added sugar, and fat.  Reduce your daily sodium intake. Most people with hypertension should eat less than 1,500 mg of sodium a day.  Limit alcohol intake to no more than 1 drink a day for nonpregnant women and 2 drinks a day for men. One drink equals 12 oz of beer, 5 oz of wine, or 1 oz of hard liquor. Lifestyle  Work with your health care provider to maintain a healthy body weight, or to lose weight. Ask what an ideal weight is for you.  Get at least 30 minutes of exercise that causes your heart to beat faster (aerobic exercise) most days of the week. Activities may include walking, swimming, or biking.    Include exercise to strengthen your muscles (resistance exercise), such as weight lifting, as part of your weekly exercise routine. Try to do these types of exercises for 30 minutes at least 3 days a week.  Do not use any products that contain nicotine or tobacco, such as cigarettes and e-cigarettes. If you need help quitting,  ask your health care provider.  Control any long-term (chronic) conditions you have, such as high cholesterol or diabetes. Monitoring  Monitor your blood pressure at home as told by your health care provider. Your personal target blood pressure may vary depending on your medical conditions, your age, and other factors.  Have your blood pressure checked regularly, as often as told by your health care provider. Working with your health care provider  Review all the medicines you take with your health care provider because there may be side effects or interactions.  Talk with your health care provider about your diet, exercise habits, and other lifestyle factors that may be contributing to hypertension.  Visit your health care provider regularly. Your health care provider can help you create and adjust your plan for managing hypertension. Will I need medicine to control my blood pressure? Your health care provider may prescribe medicine if lifestyle changes are not enough to get your blood pressure under control, and if:  Your systolic blood pressure is 130 or higher.  Your diastolic blood pressure is 80 or higher. Take medicines only as told by your health care provider. Follow the directions carefully. Blood pressure medicines must be taken as prescribed. The medicine does not work as well when you skip doses. Skipping doses also puts you at risk for problems. Contact a health care provider if:  You think you are having a reaction to medicines you have taken.  You have repeated (recurrent) headaches.  You feel dizzy.  You have swelling in your ankles.  You have trouble with your vision. Get help right away if:  You develop a severe headache or confusion.  You have unusual weakness or numbness, or you feel faint.  You have severe pain in your chest or abdomen.  You vomit repeatedly.  You have trouble breathing. Summary  Hypertension is when the force of blood pumping  through your arteries is too strong. If this condition is not controlled, it may put you at risk for serious complications.  Your personal target blood pressure may vary depending on your medical conditions, your age, and other factors. For most people, a normal blood pressure is less than 120/80.  Hypertension is managed by lifestyle changes, medicines, or both. Lifestyle changes include weight loss, eating a healthy, low-sodium diet, exercising more, and limiting alcohol. This information is not intended to replace advice given to you by your health care provider. Make sure you discuss any questions you have with your health care provider. Document Revised: 07/23/2018 Document Reviewed: 02/27/2016 Elsevier Patient Education  2020 [employer]  

## 2019-08-26 NOTE — Chronic Care Management (AMB) (Signed)
  Care Management   Note  08/26/2019 Name: Lonnie Huber MRN: [id number] DOB: 1945-10-12  Lonnie Huber is a 74 y.o. year old male who is a primary care patient of Sander Radon and is actively engaged with the care management team. I reached out to [employer] by phone today to assist with scheduling an initial visit with the Pharmacist  Follow up plan: Patient declines further follow up and engagement by the care management team. Appropriate care team members and provider have been notified via electronic communication.   Penne Lash, RMA Care Guide, Embedded Care Coordination [facility]  [place], [place] [address] Direct Dial: [phone] [email or web address]

## 2019-08-26 NOTE — Progress Notes (Signed)
Name: IMRI LOR   MRN: [id number]    DOB: 12/08/1945   Date:08/26/2019       Progress Note  Chief Complaint  Patient presents with  . Hypertension    follow up  . Hyperlipidemia     Subjective:   Lonnie Huber is a 74 y.o. male, presents to clinic for routine follow up on the conditions listed above.  Hypertension:  Currently managed on losarton 100 mg Pt reports good med compliance and denies any SE.  No lightheadedness, hypotension, syncope. Blood pressure today is at goal for age-well controlled. BP Readings from Last 3 Encounters:  08/26/19 (!) 146/84  07/28/19 (!) 142/82  04/28/19 (!) 144/82   Pt denies CP, SOB, exertional sx, LE edema, palpitation, Ha's, visual disturbances  Hyperlipidemia: Current Medication Regimen:  lipitor 40 mg compliant Last Lipids: Lab Results  Component Value Date   CHOL 149 03/08/2019   HDL 38 (L) 03/08/2019   LDLCALC 95 03/08/2019   TRIG 74 03/08/2019   CHOLHDL 3.9 03/08/2019  - Current Diet:  poor - Denies: Chest pain, shortness of breath, myalgias. - Risk factors for atherosclerosis: hypercholesterolemia and hypertension  ETOH - he says he has cut back on amount and frequency of drinking, maybe 4 d a week, he says he drinks when friends come by or when he gets his check, very nonspeciic  Patient Active Problem List   Diagnosis Date Noted  . Pure hypercholesterolemia 03/08/2019  . Nutritional anemia 03/08/2019  . Medication monitoring encounter 07/03/2017  . Hyperglycemia 11/28/2015  . Hyperlipidemia 04/24/2015  . Chronic kidney disease (CKD), stage II (mild) 09/20/2014  . Chronic low back pain 09/20/2014  . Diverticulosis of colon 09/20/2014  . Failure of erection 09/20/2014  . Gout 09/20/2014  . Chronic pain of right knee 09/20/2014  . Alcohol abuse 09/20/2014  . Nondependent alcohol abuse, episodic drinking behavior 07/10/2008  . Decreased libido 10/20/2007  . Benign essential HTN 06/18/2007  . Combined fat  and carbohydrate induced hyperlipemia 06/18/2007    Past Surgical History:  Procedure Laterality Date  . COLONOSCOPY WITH PROPOFOL N/A 10/10/2016   Procedure: COLONOSCOPY WITH PROPOFOL;  Surgeon: Wyline Mood, MD;  Location: [facility] ENDOSCOPY;  Service: Endoscopy;  Laterality: N/A;  . LUMBAR FUSION  10/12/2009  . LUMBAR LAMINECTOMY  10/12/2009  . REPLACEMENT TOTAL KNEE Right 08/2013    Family History  Problem Relation Age of Onset  . Coronary artery disease Mother   . Congestive Heart Failure Father   . Coronary artery disease Brother   . COPD Sister   . Breast cancer Sister 49  . Heart attack Cousin   . Diabetes Cousin     Social History   Tobacco Use  . Smoking status: Former Smoker    Types: Cigarettes    Quit date: 09/20/1978    Years since quitting: 40.9  . Smokeless tobacco: Never Used  Substance Use Topics  . Alcohol use: Yes    Alcohol/week: 0.0 - 21.0 standard drinks    Comment: " i drink when I want to drink' stated pt  . Drug use: No      Current Outpatient Medications:  .  allopurinol (ZYLOPRIM) 100 MG tablet, Take 1 tablet (100 mg total) by mouth daily., Disp: 90 tablet, Rfl: 1 .  atorvastatin (LIPITOR) 40 MG tablet, Take 1 tablet (40 mg total) by mouth daily., Disp: 90 tablet, Rfl: 1 .  losartan (COZAAR) 100 MG tablet, Take 1 tablet (100 mg total) by mouth daily.,  Disp: 90 tablet, Rfl: 3 .  magnesium oxide (MAG-OX) 400 MG tablet, One by mouth three days a day Friday (10/11) and Saturday(10/12) and Sunday (10/13), then one a day, Disp: , Rfl:  .  Omega 3 1000 MG CAPS, Take 3 capsules (3,000 mg total) by mouth daily., Disp: , Rfl:  .  thiamine (VITAMIN B-1) 100 MG tablet, Take 1 tablet (100 mg total) by mouth daily., Disp: 90 tablet, Rfl: 3  Allergies  Allergen Reactions  . Fluzone [Flu Virus Vaccine]     Chart Review Today: I personally reviewed active problem list, medication list, allergies, family history, social history, health maintenance, notes from  last encounter, lab results, imaging with the patient/caregiver today.  Review of Systems  10 Systems reviewed and are negative for acute change except as noted in the HPI.      Objective:    Vitals:   08/26/19 0802  BP: (!) 146/84  Pulse: 98  Resp: 18  Temp: 98.2 F (36.8 C)  TempSrc: Temporal  SpO2: 97%  Weight: 206 lb 9.6 oz (93.7 kg)  Height: 6' (1.829 m)    Body mass index is 28.02 kg/m.  Physical Exam Vitals and nursing note reviewed.  Constitutional:      General: He is not in acute distress.    Appearance: He is well-developed. He is not ill-appearing, toxic-appearing or diaphoretic.  HENT:     Head: Normocephalic and atraumatic.  Eyes:     General:        Right eye: No discharge.        Left eye: No discharge.     Conjunctiva/sclera: Conjunctivae normal.  Neck:     Trachea: No tracheal deviation.  Cardiovascular:     Rate and Rhythm: Normal rate and regular rhythm.     Pulses: Normal pulses.     Heart sounds: Normal heart sounds. No murmur. No friction rub. No gallop.   Pulmonary:     Effort: Pulmonary effort is normal. No respiratory distress.     Breath sounds: Normal breath sounds. No stridor. No wheezing, rhonchi or rales.  Abdominal:     General: Bowel sounds are normal. There is no distension.     Palpations: Abdomen is soft.  Skin:    General: Skin is warm and dry.     Findings: No rash.  Neurological:     Mental Status: He is alert. Mental status is at baseline.     Motor: No abnormal muscle tone.  Psychiatric:        Mood and Affect: Mood normal.        Behavior: Behavior normal.       PHQ2/9: Depression screen [facility] 2/9 08/26/2019 07/28/2019 04/28/2019 03/08/2019 12/06/2018  Decreased Interest 1 0 0 0 0  Down, Depressed, Hopeless 0 0 1 0 0  PHQ - 2 Score 1 0 1 0 0  Altered sleeping 0 - 0 0 0  Tired, decreased energy 0 - 0 0 0  Change in appetite 0 - 0 0 0  Feeling bad or failure about yourself  0 - 0 0 0  Trouble concentrating 0 -  0 0 0  Moving slowly or fidgety/restless 0 - 0 0 0  Suicidal thoughts 0 - 0 0 0  PHQ-9 Score 1 - 1 0 0  Difficult doing work/chores Not difficult at all - Not difficult at all Not difficult at all Not difficult at all  Some recent data might be hidden    phq 9  is neg, reviewed  Fall Risk: Fall Risk  08/26/2019 07/28/2019 04/28/2019 03/08/2019 12/06/2018  Falls in the past year? 0 0 0 0 0  Number falls in past yr: 0 0 0 0 0  Injury with Fall? 0 0 0 0 0  Risk for fall due to : - No Fall Risks - - -  Follow up Falls evaluation completed Falls prevention discussed - - -    Functional Status Survey: Is the patient deaf or have difficulty hearing?: No Does the patient have difficulty seeing, even when wearing glasses/contacts?: Yes Does the patient have difficulty concentrating, remembering, or making decisions?: No Does the patient have difficulty walking or climbing stairs?: No Does the patient have difficulty dressing or bathing?: No Does the patient have difficulty doing errands alone such as visiting a doctor's office or shopping?: No   Assessment & Plan:   1. Benign essential HTN Fairly well controlled for age discussed with the patient today keeping his blood pressure under 140/90, working on [employer], healthy lifestyle, decreasing salt, he is encouraged to monitor his blood pressure at home follow-up if it is greater than 150/90.  - COMPLETE METABOLIC PANEL WITH GFR  2. Pure hypercholesterolemia The patient is compliant with medications, he has not been any side effects or myalgias - atorvastatin (LIPITOR) 40 MG tablet; Take 1 tablet (40 mg total) by mouth daily.  Dispense: 90 tablet; Refill: 3 - COMPLETE METABOLIC PANEL WITH GFR - Lipid panel  3. Gout, unspecified cause, unspecified chronicity, unspecified site Patient managed on allopurinol, his last uric acid level was 5.1, well controlled and at goal, he has also been working on diet and cutting back on alcohol.  Will not  recheck uric acid today due to good control with prior labs and no recent gout flares, meds refilled - allopurinol (ZYLOPRIM) 100 MG tablet; Take 1 tablet (100 mg total) by mouth daily.  Dispense: 90 tablet; Refill: 3  4. Medication monitoring encounter - atorvastatin (LIPITOR) 40 MG tablet; Take 1 tablet (40 mg total) by mouth daily.  Dispense: 90 tablet; Refill: 3 - allopurinol (ZYLOPRIM) 100 MG tablet; Take 1 tablet (100 mg total) by mouth daily.  Dispense: 90 tablet; Refill: 3 - COMPLETE METABOLIC PANEL WITH GFR - Lipid panel   Return for 6 month routine follow.   Danelle Berry, PA-C 08/26/19 8:18 AM

## 2020-01-18 IMAGING — MR MR HEAD W/O CM
11 series · 42 of 48 positions shown · non-contrast
Comparison: Report of noncontrast head CT 05/11/2000 (no images
available).

CLINICAL DATA: 72-year-old male with weakness, instability and loss
of balance when walking for 1 month.

EXAM:
MRI HEAD WITHOUT CONTRAST
TECHNIQUE: Multiplanar, multiecho pulse sequences of the brain and surrounding
structures were obtained without intravenous contrast.

[Series 5: ax dwi_tracew · axial · 3.0mm · 0.60mm/px · z∈[-57,+105]mm · 5 of 55 slices shown]
[im 1/55]
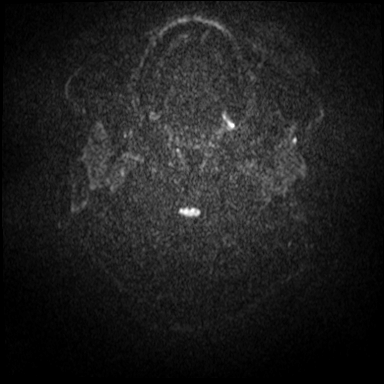
[im 14/55]
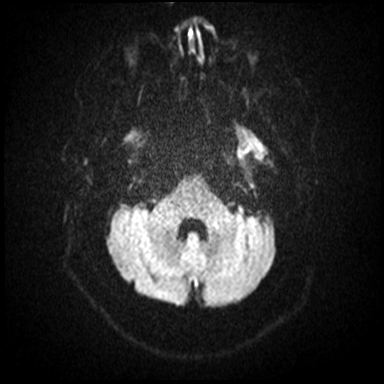
[im 28/55]
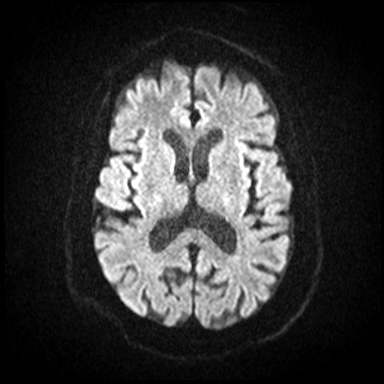
[im 41/55]
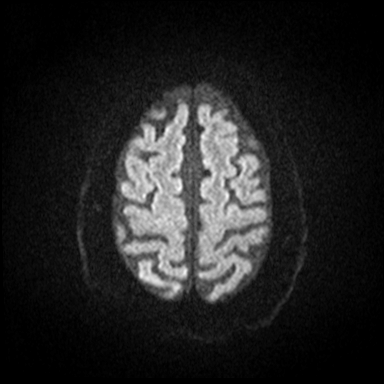
[im 55/55]
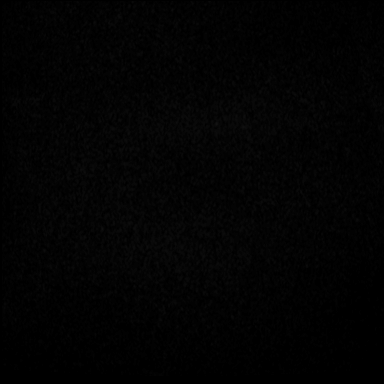

[Series 6: ax dwi_adc · axial · 3.0mm · 0.60mm/px · z∈[-57,+102]mm · 4 of 54 slices shown]
[im 1/54]
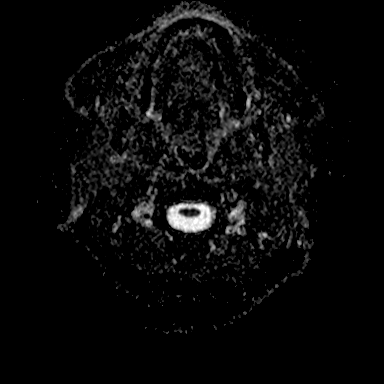
[im 18/54]
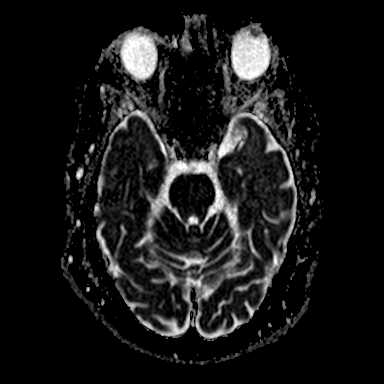
[im 36/54]
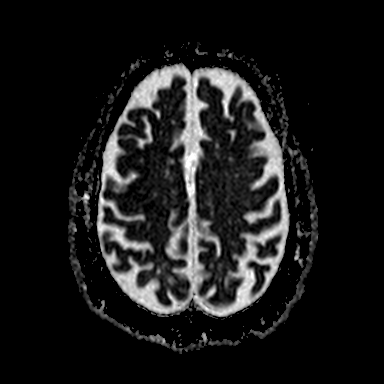
[im 54/54]
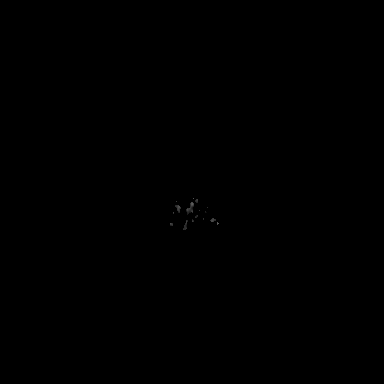

[Series 7: cor dwi_tracew · coronal · 5.0mm · 0.60mm/px · 3 of 43 slices shown]
[im 1/43]
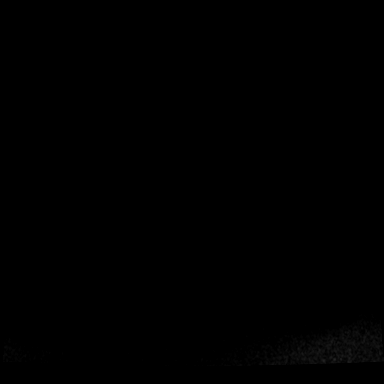
[im 22/43]
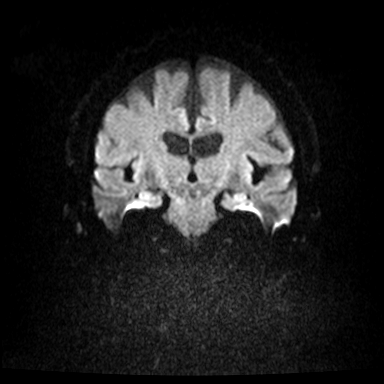
[im 43/43]
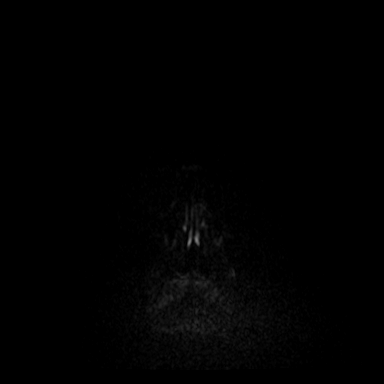

[Series 8: cor dwi_adc · coronal · 5.0mm · 0.60mm/px · 3 of 43 slices shown]
[im 1/43]
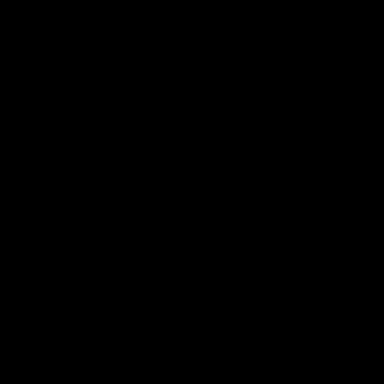
[im 22/43]
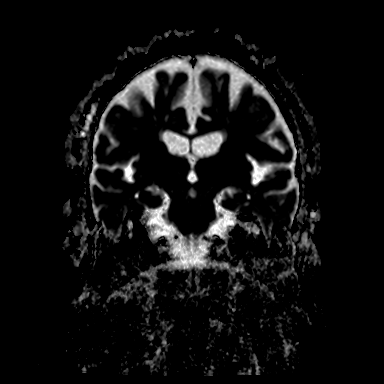
[im 43/43]
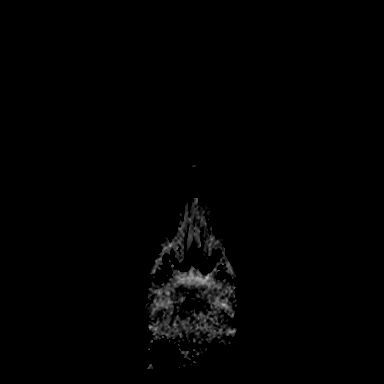

[Series 9: T1 · sagittal · 5.0mm · 0.62mm/px · 2 of 23 slices shown (1 of 2)]
[im 1/23]
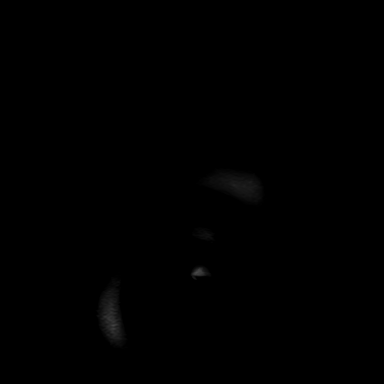
[im 23/23]
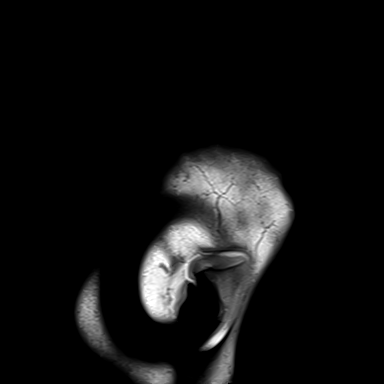

[Series 10: T2 · axial · 5.0mm · 0.53mm/px · z∈[-51,+93]mm · 2 of 25 slices shown (1 of 2)]
[im 1/25]
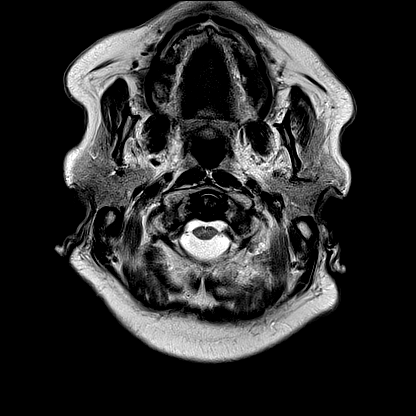
[im 25/25]
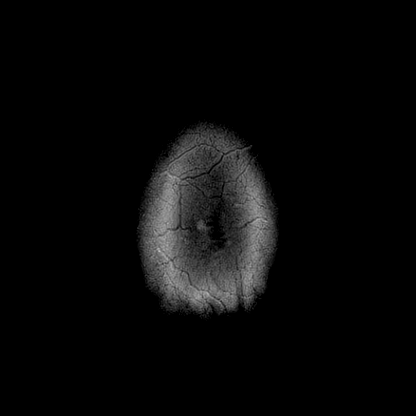

[Series 11: swi_images · axial · 3.0mm · 0.90mm/px · z∈[-68,+109]mm · 5 of 60 slices shown]
[im 1/60]
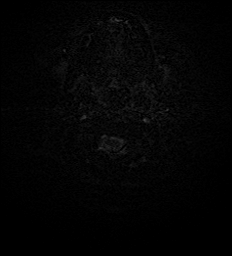
[im 15/60]
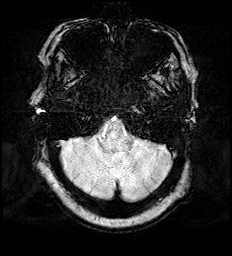
[im 30/60]
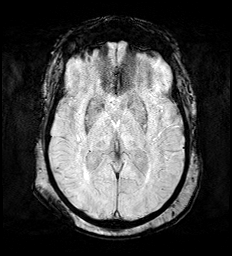
[im 45/60]
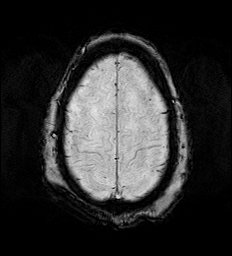
[im 60/60]
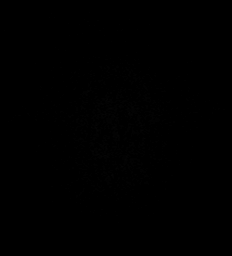

[Series 12: mip_images(sw) · axial · 24.0mm · 0.90mm/px · z∈[-57,+99]mm · 4 of 53 slices shown]
[im 1/53]
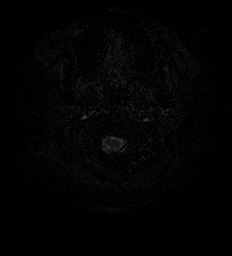
[im 18/53]
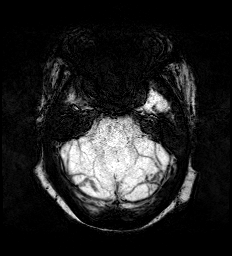
[im 35/53]
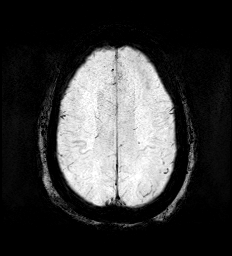
[im 53/53]
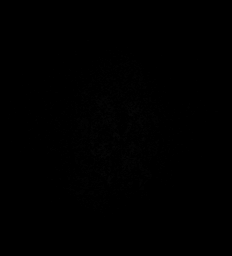

[Series 13: FLAIR · axial · 3.0mm · 0.53mm/px · z∈[-60,+102]mm · 4 of 55 slices shown]
[im 1/55]
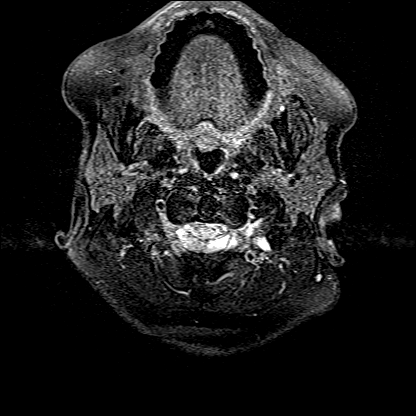
[im 19/55]
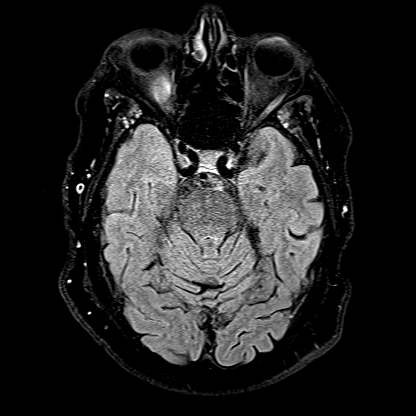
[im 37/55]
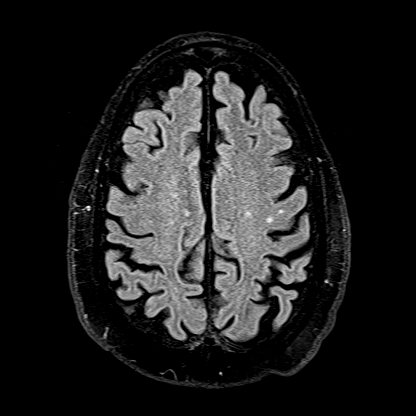
[im 55/55]
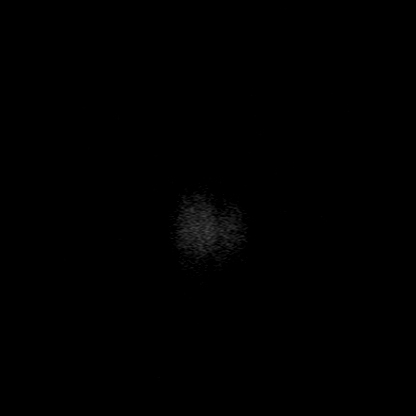

[Series 14: T1 · axial · 1.0mm · 0.98mm/px · z∈[-67,+108]mm · 8 of 174 slices shown (2 of 2)]
[im 1/174]
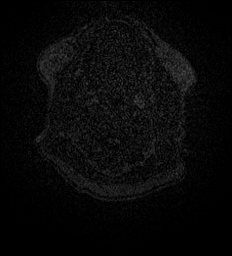
[im 27/174]
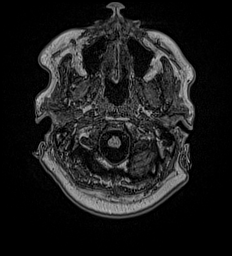
[im 54/174]
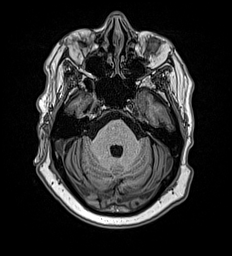
[im 80/174]
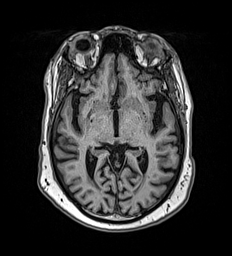
[im 94/174]
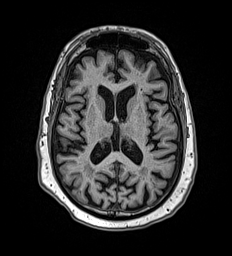
[im 120/174]
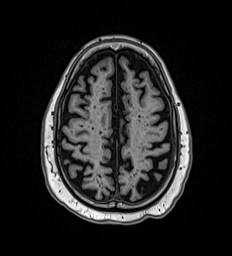
[im 147/174]
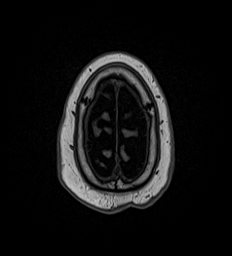
[im 174/174]
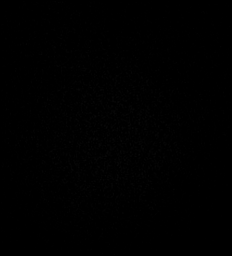

[Series 15: T2 · coronal · 5.0mm · 0.57mm/px · 2 of 29 slices shown (2 of 2)]
[im 1/29]
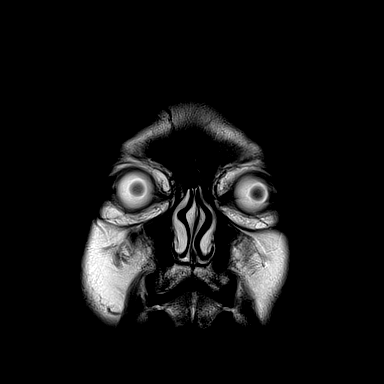
[im 29/29]
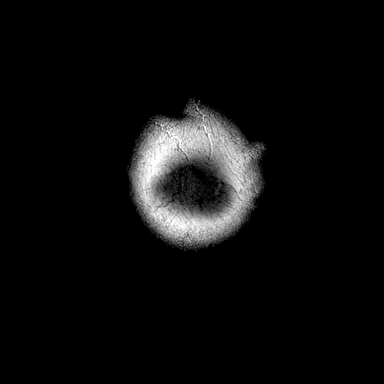

[42 of 48 positions shown; findings below may reference images not displayed]

FINDINGS: Brain: Cerebral volume is within normal limits for age. No
restricted diffusion to suggest acute infarction. No midline shift,
mass effect, evidence of mass lesion, ventriculomegaly, extra-axial
collection or acute intracranial hemorrhage. Cervicomedullary
junction and pituitary are within normal limits.

No cortical encephalomalacia or chronic cerebral blood products.

The cerebellum, brainstem, and deep gray matter nuclei appear
normal.

Patchy and scattered cerebral white matter T2 and FLAIR
hyperintensity is mild to moderate for age and in a nonspecific
configuration.

Vascular: Major intracranial vascular flow voids are preserved. The
cavernous ICAs are tortuous.

Skull and upper cervical spine: Negative visible cervical spine.
Normal bone marrow signal.

Sinuses/Orbits: Normal orbits soft tissues. Paranasal sinuses are
clear.

Other: Grossly normal visible internal auditory structures. Mastoid
air cells are well pneumatized. Scalp and face soft tissues appear
negative.
IMPRESSION: 1.  No acute intracranial abnormality.
2. Mild to moderate for age nonspecific white matter signal changes,
most commonly due to chronic small vessel disease.

## 2020-02-27 ENCOUNTER — Other Ambulatory Visit: Payer: Self-pay

## 2020-02-27 ENCOUNTER — Ambulatory Visit (INDEPENDENT_AMBULATORY_CARE_PROVIDER_SITE_OTHER): Payer: Medicare HMO | Admitting: Family Medicine

## 2020-02-27 ENCOUNTER — Encounter: Payer: Self-pay | Admitting: Family Medicine

## 2020-02-27 VITALS — BP 180/80 | HR 88 | Temp 98.1°F | Resp 16 | Ht 72.0 in | Wt 205.8 lb

## 2020-02-27 DIAGNOSIS — F439 Reaction to severe stress, unspecified: Secondary | ICD-10-CM | POA: Diagnosis not present

## 2020-02-27 DIAGNOSIS — F1010 Alcohol abuse, uncomplicated: Secondary | ICD-10-CM | POA: Diagnosis not present

## 2020-02-27 DIAGNOSIS — E7800 Pure hypercholesterolemia, unspecified: Secondary | ICD-10-CM

## 2020-02-27 DIAGNOSIS — D539 Nutritional anemia, unspecified: Secondary | ICD-10-CM

## 2020-02-27 DIAGNOSIS — M109 Gout, unspecified: Secondary | ICD-10-CM

## 2020-02-27 DIAGNOSIS — N183 Chronic kidney disease, stage 3 unspecified: Secondary | ICD-10-CM | POA: Diagnosis not present

## 2020-02-27 DIAGNOSIS — Z5181 Encounter for therapeutic drug level monitoring: Secondary | ICD-10-CM | POA: Diagnosis not present

## 2020-02-27 DIAGNOSIS — I10 Essential (primary) hypertension: Secondary | ICD-10-CM

## 2020-02-27 LAB — CBC WITH DIFFERENTIAL/PLATELET
Absolute Monocytes: 536 cells/uL (ref 200–950)
Basophils Absolute: 23 cells/uL (ref 0–200)
Basophils Relative: 0.4 %
Eosinophils Absolute: 108 cells/uL (ref 15–500)
Eosinophils Relative: 1.9 %
HCT: 43.1 % (ref 38.5–50.0)
Hemoglobin: 14.4 g/dL (ref 13.2–17.1)
Lymphs Abs: 1864 cells/uL (ref 850–3900)
MCH: 29.6 pg (ref 27.0–33.0)
MCHC: 33.4 g/dL (ref 32.0–36.0)
MCV: 88.5 fL (ref 80.0–100.0)
MPV: 11.2 fL (ref 7.5–12.5)
Monocytes Relative: 9.4 %
Neutro Abs: 3169 cells/uL (ref 1500–7800)
Neutrophils Relative %: 55.6 %
Platelets: 175 10*3/uL (ref 140–400)
RBC: 4.87 10*6/uL (ref 4.20–5.80)
RDW: 12.1 % (ref 11.0–15.0)
Total Lymphocyte: 32.7 %
WBC: 5.7 10*3/uL (ref 3.8–10.8)

## 2020-02-27 LAB — COMPLETE METABOLIC PANEL WITH GFR
AG Ratio: 1.4 (calc) (ref 1.0–2.5)
ALT: 19 U/L (ref 9–46)
AST: 24 U/L (ref 10–35)
Albumin: 3.8 g/dL (ref 3.6–5.1)
Alkaline phosphatase (APISO): 58 U/L (ref 35–144)
BUN/Creatinine Ratio: 11 (calc) (ref 6–22)
BUN: 14 mg/dL (ref 7–25)
CO2: 28 mmol/L (ref 20–32)
Calcium: 9.3 mg/dL (ref 8.6–10.3)
Chloride: 102 mmol/L (ref 98–110)
Creat: 1.24 mg/dL — ABNORMAL HIGH (ref 0.70–1.18)
GFR, Est African American: 67 mL/min/{1.73_m2} (ref >=60)
GFR, Est Non African American: 58 mL/min/{1.73_m2} — ABNORMAL LOW (ref >=60)
Globulin: 2.8 g/dL (calc) (ref 1.9–3.7)
Glucose, Bld: 100 mg/dL — ABNORMAL HIGH (ref 65–99)
Potassium: 4.5 mmol/L (ref 3.5–5.3)
Sodium: 136 mmol/L (ref 135–146)
Total Bilirubin: 1 mg/dL (ref 0.2–1.2)
Total Protein: 6.6 g/dL (ref 6.1–8.1)

## 2020-02-27 LAB — LIPID PANEL
Cholesterol: 133 mg/dL (ref <200)
HDL: 55 mg/dL (ref >=40)
LDL Cholesterol (Calc): 63 mg/dL (calc)
Non-HDL Cholesterol (Calc): 78 mg/dL (calc) (ref <130)
Total CHOL/HDL Ratio: 2.4 (calc) (ref <5.0)
Triglycerides: 69 mg/dL (ref <150)

## 2020-02-27 MED ORDER — AMLODIPINE BESYLATE 5 MG PO TABS: 5.0000 mg | ORAL_TABLET | Freq: Every day | ORAL | 1 refills | Status: DC

## 2020-02-27 MED ORDER — VITAMIN B-1 100 MG PO TABS: 100.0000 mg | ORAL_TABLET | Freq: Every day | ORAL | 3 refills | Status: DC

## 2020-02-27 NOTE — Patient Instructions (Signed)
Cut back on alcohol as much as you can  Add amlodipine to your medications daily  Return in 1-2 weeks to have your blood pressure rechecked   DASH Eating Plan DASH stands for "Dietary Approaches to Stop Hypertension." The DASH eating plan is a healthy eating plan that has been shown to reduce high blood pressure (hypertension). It may also reduce your risk for type 2 diabetes, heart disease, and stroke. The DASH eating plan may also help with weight loss. What are tips for following this plan?  General guidelines  Avoid eating more than 2,300 mg (milligrams) of salt (sodium) a day. If you have hypertension, you may need to reduce your sodium intake to 1,500 mg a day.  Limit alcohol intake to no more than 1 drink a day for nonpregnant women and 2 drinks a day for men. One drink equals 12 oz of beer, 5 oz of wine, or 1 oz of hard liquor.  Work with your health care provider to maintain a healthy body weight or to lose weight. Ask what an ideal weight is for you.  Get at least 30 minutes of exercise that causes your heart to beat faster (aerobic exercise) most days of the week. Activities may include walking, swimming, or biking.  Work with your health care provider or diet and nutrition specialist (dietitian) to adjust your eating plan to your individual calorie needs. Reading food labels   Check food labels for the amount of sodium per serving. Choose foods with less than 5 percent of the Daily Value of sodium. Generally, foods with less than 300 mg of sodium per serving fit into this eating plan.  To find whole grains, look for the word "whole" as the first word in the ingredient list. Shopping  Buy products labeled as "low-sodium" or "no salt added."  Buy fresh foods. Avoid canned foods and premade or frozen meals. Cooking  Avoid adding salt when cooking. Use salt-free seasonings or herbs instead of table salt or sea salt. Check with your health care provider or pharmacist before  using salt substitutes.  Do not fry foods. Cook foods using healthy methods such as baking, boiling, grilling, and broiling instead.  Cook with heart-healthy oils, such as olive, canola, soybean, or sunflower oil. Meal planning  Eat a balanced diet that includes: ? 5 or more servings of fruits and vegetables each day. At each meal, try to fill half of your plate with fruits and vegetables. ? Up to 6-8 servings of whole grains each day. ? Less than 6 oz of lean meat, poultry, or fish each day. A 3-oz serving of meat is about the same size as a deck of cards. One egg equals 1 oz. ? 2 servings of low-fat dairy each day. ? A serving of nuts, seeds, or beans 5 times each week. ? Heart-healthy fats. Healthy fats called Omega-3 fatty acids are found in foods such as flaxseeds and coldwater fish, like sardines, salmon, and mackerel.  Limit how much you eat of the following: ? Canned or prepackaged foods. ? Food that is high in trans fat, such as fried foods. ? Food that is high in saturated fat, such as fatty meat. ? Sweets, desserts, sugary drinks, and other foods with added sugar. ? Full-fat dairy products.  Do not salt foods before eating.  Try to eat at least 2 vegetarian meals each week.  Eat more home-cooked food and less restaurant, buffet, and fast food.  When eating at a restaurant, ask that your  food be prepared with less salt or no salt, if possible. What foods are recommended? The items listed may not be a complete list. Talk with your dietitian about what dietary choices are best for you. Grains Whole-grain or whole-wheat bread. Whole-grain or whole-wheat pasta. Brown rice. [place]. Bulgur. Whole-grain and low-sodium cereals. Pita bread. Low-fat, low-sodium crackers. Whole-wheat flour tortillas. Vegetables Fresh or frozen vegetables (raw, steamed, roasted, or grilled). Low-sodium or reduced-sodium tomato and vegetable juice. Low-sodium or reduced-sodium tomato sauce and  tomato paste. Low-sodium or reduced-sodium canned vegetables. Fruits All fresh, dried, or frozen fruit. Canned fruit in natural juice (without added sugar). Meat and other protein foods Skinless chicken or [place]. Ground chicken or [place]. Pork with fat trimmed off. Fish and seafood. Egg whites. Dried beans, peas, or lentils. Unsalted nuts, nut butters, and seeds. Unsalted canned beans. Lean cuts of beef with fat trimmed off. Low-sodium, lean deli meat. Dairy Low-fat (1%) or fat-free (skim) milk. Fat-free, low-fat, or reduced-fat cheeses. Nonfat, low-sodium ricotta or cottage cheese. Low-fat or nonfat yogurt. Low-fat, low-sodium cheese. Fats and oils Soft margarine without trans fats. Vegetable oil. Low-fat, reduced-fat, or light mayonnaise and salad dressings (reduced-sodium). Canola, safflower, olive, soybean, and sunflower oils. Avocado. Seasoning and other foods Herbs. Spices. Seasoning mixes without salt. Unsalted popcorn and pretzels. Fat-free sweets. What foods are not recommended? The items listed may not be a complete list. Talk with your dietitian about what dietary choices are best for you. Grains Baked goods made with fat, such as croissants, muffins, or some breads. Dry pasta or rice meal packs. Vegetables Creamed or fried vegetables. Vegetables in a cheese sauce. Regular canned vegetables (not low-sodium or reduced-sodium). Regular canned tomato sauce and paste (not low-sodium or reduced-sodium). Regular tomato and vegetable juice (not low-sodium or reduced-sodium). Angie Fava. Olives. Fruits Canned fruit in a light or heavy syrup. Fried fruit. Fruit in cream or butter sauce. Meat and other protein foods Fatty cuts of meat. Ribs. Fried meat. Berniece Salines. Sausage. Bologna and other processed lunch meats. Salami. Fatback. Hotdogs. Bratwurst. Salted nuts and seeds. Canned beans with added salt. Canned or smoked fish. Whole eggs or egg yolks. Chicken or [place] with skin. Dairy Whole or 2%  milk, cream, and half-and-half. Whole or full-fat cream cheese. Whole-fat or sweetened yogurt. Full-fat cheese. Nondairy creamers. Whipped toppings. Processed cheese and cheese spreads. Fats and oils Butter. Stick margarine. Lard. Shortening. Ghee. Bacon fat. Tropical oils, such as coconut, palm kernel, or palm oil. Seasoning and other foods Salted popcorn and pretzels. Onion salt, garlic salt, seasoned salt, table salt, and sea salt. Worcestershire sauce. Tartar sauce. Barbecue sauce. Teriyaki sauce. Soy sauce, including reduced-sodium. Steak sauce. Canned and packaged gravies. Fish sauce. Oyster sauce. Cocktail sauce. Horseradish that you find on the shelf. Ketchup. Mustard. Meat flavorings and tenderizers. Bouillon cubes. Hot sauce and Tabasco sauce. Premade or packaged marinades. Premade or packaged taco seasonings. Relishes. Regular salad dressings. Where to find more information:  National Heart, Lung, and [place]: https://wilson-eaton.com/  American Heart Association: www.heart.org Summary  The DASH eating plan is a healthy eating plan that has been shown to reduce high blood pressure (hypertension). It may also reduce your risk for type 2 diabetes, heart disease, and stroke.  With the DASH eating plan, you should limit salt (sodium) intake to 2,300 mg a day. If you have hypertension, you may need to reduce your sodium intake to 1,500 mg a day.  When on the DASH eating plan, aim to eat more fresh fruits and vegetables,  whole grains, lean proteins, low-fat dairy, and heart-healthy fats.  Work with your health care provider or diet and nutrition specialist (dietitian) to adjust your eating plan to your individual calorie needs. This information is not intended to replace advice given to you by your health care provider. Make sure you discuss any questions you have with your health care provider. Document Revised: 03/13/2017 Document Reviewed: 03/24/2016 Elsevier Patient Education  2020  [employer].

## 2020-02-27 NOTE — Progress Notes (Signed)
Name: Lonnie Huber   MRN: [id number]    DOB: 1946-03-27   Date:02/27/2020       Progress Note  Chief Complaint  Patient presents with  . Hypertension     Subjective:   Lonnie Huber is a 74 y.o. male, presents to clinic for routine f/up  BP is very elevated today despite multiple repeats  Hypertension:  Currently managed on losartan 100 mg daily  Pt reports good med compliance and denies any SE.   Blood pressure today is uncontrolled - he reports being severely stressed recently BP Readings from Last 3 Encounters:  02/27/20 (!) 180/80  08/26/19 (!) 146/84  07/28/19 (!) 142/82   Pt denies CP, SOB, exertional sx, LE edema, palpitation, Ha's, visual disturbances, lightheadedness, hypotension, syncope.  HE's been walking/exercising He's lost some weight  HLD- lipitor 40 mg and omega 3 supplement, Good med compliance he denies any myalgias, chest pain, exertional shortness of breath or claudication   He's having trouble with his mail order [facility] pharmacy with thiamine supplement Hx of ETOH abuse, he still drinks often, has no where to go, his friends come over and they drink and don't "keep count" of how much they drink  Gout -managed with 100 mg allopurinol daily drinking beer does not seem to trigger it but he has avoided food triggers no recent gout flares   Current Outpatient Medications:  .  allopurinol (ZYLOPRIM) 100 MG tablet, Take 1 tablet (100 mg total) by mouth daily., Disp: 90 tablet, Rfl: 3 .  atorvastatin (LIPITOR) 40 MG tablet, Take 1 tablet (40 mg total) by mouth daily., Disp: 90 tablet, Rfl: 3 .  losartan (COZAAR) 100 MG tablet, Take 1 tablet (100 mg total) by mouth daily., Disp: 90 tablet, Rfl: 3 .  magnesium oxide (MAG-OX) 400 MG tablet, One by mouth three days a day Friday (10/11) and Saturday(10/12) and Sunday (10/13), then one a day, Disp: , Rfl:  .  Omega 3 1000 MG CAPS, Take 3 capsules (3,000 mg total) by mouth daily., Disp: , Rfl:  .  thiamine  (VITAMIN B-1) 100 MG tablet, Take 1 tablet (100 mg total) by mouth daily., Disp: 90 tablet, Rfl: 3  Patient Active Problem List   Diagnosis Date Noted  . Pure hypercholesterolemia 03/08/2019  . Nutritional anemia 03/08/2019  . Medication monitoring encounter 07/03/2017  . Hyperglycemia 11/28/2015  . Hyperlipidemia 04/24/2015  . Chronic kidney disease (CKD), stage II (mild) 09/20/2014  . Chronic low back pain 09/20/2014  . Diverticulosis of colon 09/20/2014  . Failure of erection 09/20/2014  . Gout 09/20/2014  . Chronic pain of right knee 09/20/2014  . Alcohol abuse 09/20/2014  . Nondependent alcohol abuse, episodic drinking behavior 07/10/2008  . Decreased libido 10/20/2007  . Benign essential HTN 06/18/2007  . Combined fat and carbohydrate induced hyperlipemia 06/18/2007    Past Surgical History:  Procedure Laterality Date  . COLONOSCOPY WITH PROPOFOL N/A 10/10/2016   Procedure: COLONOSCOPY WITH PROPOFOL;  Surgeon: Wyline Mood, MD;  [facility] ENDOSCOPY;  Service: Endoscopy;  Laterality: N/A;  . LUMBAR FUSION  10/12/2009  . LUMBAR LAMINECTOMY  10/12/2009  . REPLACEMENT TOTAL KNEE Right 08/2013    Family History  Problem Relation Age of Onset  . Coronary artery disease Mother   . Congestive Heart Failure Father   . Coronary artery disease Brother   . COPD Sister   . Breast cancer Sister 54  . Heart attack Cousin   . Diabetes Cousin  Social History   Tobacco Use  . Smoking status: Former Smoker    Types: Cigarettes    Quit date: 09/20/1978    Years since quitting: 41.4  . Smokeless tobacco: Never Used  Vaping Use  . Vaping Use: Never used  Substance Use Topics  . Alcohol use: Yes    Alcohol/week: 0.0 - 21.0 standard drinks    Comment: " i drink when I want to drink' stated pt  . Drug use: No     Allergies  Allergen Reactions  . Fluzone [Flu Virus Vaccine]     Health Maintenance  Topic Date Due  . INFLUENZA VACCINE  07/12/2020 (Originally 11/13/2019)   . COLONOSCOPY  10/10/2021  . TETANUS/TDAP  04/26/2022  . COVID-19 Vaccine  Completed  . Hepatitis C Screening  Completed  . PNA vac Low Risk Adult  Completed    Chart Review Today: I personally reviewed active problem list, medication list, allergies, family history, social history, health maintenance, notes from last encounter, lab results, imaging with the patient/caregiver today.   Review of Systems  10 Systems reviewed and are negative for acute change except as noted in the HPI.  Objective:   Vitals:   02/27/20 0807 02/27/20 0814 02/27/20 0824  BP: (!) 160/100 (!) 180/90 (!) 180/80  Pulse: 88    Resp: 16    Temp: 98.1 F (36.7 C)    TempSrc: Oral    SpO2: 99%    Weight: 205 lb 12.8 oz (93.4 kg)    Height: 6' (1.829 m)      Body mass index is 27.91 kg/m.  Physical Exam Vitals and nursing note reviewed.  Constitutional:      General: He is not in acute distress.    Appearance: Normal appearance. He is well-developed and overweight. He is not ill-appearing, toxic-appearing or diaphoretic.     Interventions: Face mask in place.  HENT:     Head: Normocephalic and atraumatic.     Right Ear: External ear normal.     Left Ear: External ear normal.  Eyes:     General: No scleral icterus.       Right eye: No discharge.        Left eye: No discharge.     Conjunctiva/sclera: Conjunctivae normal.  Neck:     Trachea: No tracheal deviation.  Cardiovascular:     Rate and Rhythm: Normal rate and regular rhythm.     Pulses: Normal pulses.     Heart sounds: Normal heart sounds. No murmur heard.  No friction rub. No gallop.   Pulmonary:     Effort: Pulmonary effort is normal. No respiratory distress.     Breath sounds: Normal breath sounds. No stridor. No wheezing, rhonchi or rales.  Abdominal:     General: Bowel sounds are normal. There is no distension.     Palpations: Abdomen is soft.     Tenderness: There is no abdominal tenderness. There is no guarding.      Comments: Protuberant abdomen nontender nondistended  Musculoskeletal:        General: No swelling.     Right lower leg: No edema.     Left lower leg: No edema.  Skin:    General: Skin is warm and dry.     Coloration: Skin is not jaundiced or pale.     Findings: No rash.  Neurological:     Mental Status: He is alert. Mental status is at baseline.     Motor: No abnormal muscle  tone.     Gait: Gait normal.  Psychiatric:        Mood and Affect: Mood is anxious. Mood is not depressed. Affect is not flat.        Speech: Speech normal.        Behavior: Behavior normal. Behavior is cooperative.         Assessment & Plan:   1. Alcohol abuse Encouraged to cut back to morderate intake  - thiamine (VITAMIN B-1) 100 MG tablet; Take 1 tablet (100 mg total) by mouth daily.  Dispense: 90 tablet; Refill: 3 - CBC with Differential/Platelet  2. Medication monitoring encounter - thiamine (VITAMIN B-1) 100 MG tablet; Take 1 tablet (100 mg total) by mouth daily.  Dispense: 90 tablet; Refill: 3 - CBC with Differential/Platelet - COMPLETE METABOLIC PANEL WITH GFR - Lipid panel  3. Benign essential HTN Elevated today, pt is adamant that he's stressed and anxious today, will return in 1-2 weeks for BP recheck Would suggest increasing amlodipine if blood pressure is still elevated Reviewed with the patient how to check his blood pressure, blood pressure log and instructions given, reviewed concerning signs and symptoms of elevated blood pressure that he needs to get seen for immediately if red flags occur Currently he is asymptomatic I do not have concern for endorgan damage Will check his basic labs today - COMPLETE METABOLIC PANEL WITH GFR  4. Pure hypercholesterolemia Recheck cholesterol and CMP, he is compliant with atorvastatin without any side effects or concerns - COMPLETE METABOLIC PANEL WITH GFR - Lipid panel  5. Stage 3 chronic kidney disease, unspecified whether stage 3a or 3b CKD  (HCC) Monitoring renal function - COMPLETE METABOLIC PANEL WITH GFR  6. Gout, unspecified cause, unspecified chronicity, unspecified site Currently symptoms well controlled without any recent flares will recheck renal function continue allopurinol 100 mg dose, avoid food triggers and cut back on EtOH - COMPLETE METABOLIC PANEL WITH GFR  7. Nutritional anemia Likely due to alcohol and malabsorption, he is having trouble getting his thiamine supplement, will recheck CBC with differential recheck - CBC with Differential/Platelet  8. Situational stress Coping with increased stress through increased alcohol intake   Return for 1 week nurse BP f/up and recheck, 3 month routine f/up HTN, gout, ETOH.   Danelle Berry, PA-C 02/27/20 8:25 AM

## 2020-02-28 ENCOUNTER — Encounter: Payer: Self-pay | Admitting: Family Medicine

## 2020-03-05 ENCOUNTER — Ambulatory Visit (INDEPENDENT_AMBULATORY_CARE_PROVIDER_SITE_OTHER): Payer: Medicare HMO | Admitting: Family Medicine

## 2020-03-05 ENCOUNTER — Other Ambulatory Visit: Payer: Self-pay

## 2020-03-05 VITALS — BP 170/100 | HR 87

## 2020-03-05 DIAGNOSIS — I10 Essential (primary) hypertension: Secondary | ICD-10-CM

## 2020-03-05 DIAGNOSIS — Z0130 Encounter for examination of blood pressure without abnormal findings: Secondary | ICD-10-CM

## 2020-03-05 MED ORDER — AMLODIPINE BESYLATE 10 MG PO TABS: 10.0000 mg | ORAL_TABLET | Freq: Every day | ORAL | 1 refills | Status: DC

## 2020-03-05 NOTE — Patient Instructions (Signed)
Take your losartan 100 mg once daily in the morning  Take 10 mg of amlodipine (take 2 of your 5 mg until they are out and [facility] will mail you a 10 mg dose)  Return in 2 weeks for office visit with me

## 2020-03-05 NOTE — Progress Notes (Signed)
Patient is here for a blood pressure check. Patient denies chest pain, palpitations, shortness of breath or visual disturbances. At previous visit on 02/27/2020 blood pressure was 180/80 with a heart rate of 88. Today during nurse visit first check blood pressure was 170/100 with a heart rate of 87. After resting for 10 minutes it was 170/80 and heart rate was 78. He does take blood pressure medications as prescribed, with no missed doses.

## 2020-03-05 NOTE — Progress Notes (Signed)
Here for nurse BP recheck  BP still elevated after multiple rechecks  Pt anxious He started higher dose losartan, but I do not believe he added norvasc to meds daily He continues to deny any CP, SOB, LE edema, HA's, palpitations, near syncope, exertional symptoms I did speak with the patient today in person, examined him  Hypertension:   BP Readings from Last 3 Encounters:  03/05/20 (!) 170/100  02/27/20 (!) 180/80  08/26/19 (!) 146/84   Review of Systems  Constitutional: Negative.   HENT: Negative.   Eyes: Negative.  Negative for visual disturbance.  Respiratory: Negative.  Negative for cough, choking and chest tightness.   Cardiovascular: Negative.  Negative for chest pain, palpitations and leg swelling.  Gastrointestinal: Negative.  Negative for abdominal pain.  Endocrine: Negative.   Genitourinary: Negative.   Musculoskeletal: Negative.   Skin: Negative.   Allergic/Immunologic: Negative.   Neurological: Negative.  Negative for dizziness, tremors, syncope, light-headedness and headaches.  Hematological: Negative.   Psychiatric/Behavioral: The patient is nervous/anxious.   All other systems reviewed and are negative.  I personally reviewed active problem list, medication list, allergies, family history, social history, health maintenance, notes from last encounter, lab results, imaging with the patient/caregiver today.  OBJECTIVE:  Vitals:   03/05/20 0853  BP: (!) 170/100  Pulse: 87  SpO2: 98%   Physical Exam Vitals and nursing note reviewed.  Constitutional:      General: He is not in acute distress.    Appearance: Normal appearance. He is obese. He is not ill-appearing, toxic-appearing or diaphoretic.  HENT:     Head: Normocephalic and atraumatic.     Mouth/Throat:     Mouth: Mucous membranes are moist.  Eyes:     General:        Right eye: No discharge.        Left eye: No discharge.     Conjunctiva/sclera: Conjunctivae normal.  Cardiovascular:     Rate  and Rhythm: Normal rate and regular rhythm.     Pulses: Normal pulses.     Heart sounds: Normal heart sounds.  Pulmonary:     Effort: Pulmonary effort is normal. No respiratory distress.     Breath sounds: Normal breath sounds. No stridor. No wheezing or rales.  Abdominal:     General: Bowel sounds are normal. There is no distension.     Palpations: Abdomen is soft.  Skin:    General: Skin is warm.     Capillary Refill: Capillary refill takes less than 2 seconds.     Coloration: Skin is not jaundiced or pale.  Neurological:     Mental Status: He is alert. Mental status is at baseline.  Psychiatric:        Mood and Affect: Mood normal.        Behavior: Behavior normal.      A&P  1. Hypertension, unspecified type Still uncontrolled Continue losartan 100 mg Add norvasc - was previously supposed to start 5 mg, but seems like he did not start this - start 10 mg and f/up in clinic in 2 weeks I reviewed meds and diet/lifestyle with pt today and printed out instructions Encouraged him to work on decreasing salt in diet, decrease ETOH intake,  - amLODipine (NORVASC) 10 MG tablet; Take 1 tablet (10 mg total) by mouth daily.  Dispense: 90 tablet; Refill: 1  Return for 2 week f/up appt  with Kathrina Crosley HTN/med changes.  Danelle Berry, PA-C

## 2020-03-22 ENCOUNTER — Encounter: Payer: Self-pay | Admitting: Family Medicine

## 2020-03-22 ENCOUNTER — Other Ambulatory Visit: Payer: Self-pay

## 2020-03-22 ENCOUNTER — Ambulatory Visit (INDEPENDENT_AMBULATORY_CARE_PROVIDER_SITE_OTHER): Payer: Medicare HMO | Admitting: Family Medicine

## 2020-03-22 VITALS — BP 134/82 | HR 92 | Temp 97.9°F | Resp 18 | Ht 72.0 in | Wt 205.6 lb

## 2020-03-22 DIAGNOSIS — I10 Essential (primary) hypertension: Secondary | ICD-10-CM | POA: Diagnosis not present

## 2020-03-22 NOTE — Progress Notes (Unsigned)
Name: Lonnie Huber   MRN: [id number]    DOB: Feb 02, 1946   Date:03/22/2020       Progress Note  Chief Complaint  Patient presents with  . Hypertension    2 week recheck     Subjective:   Lonnie Huber is a 74 y.o. male, presents to clinic for BP recheck   Pt here for blood pressure recheck. Lab OV was 11/22 and 11/15 and blood pressure was elevated  BP Readings from Last 5 Encounters:  03/22/20 134/82  03/05/20 (!) 170/100  02/27/20 (!) 180/80  08/26/19 (!) 146/84  07/28/19 (!) 142/82    Meds were changed to adding norvasc 10 to losartan 100 mg He is unable to monitor blood pressure at home Taking meds as directed w/o problems, reports he is compliant with med changes. Pt denies chest pain, SOB, dizziness, or heart palpitations.  Denies medication side effects.     Current Outpatient Medications:  .  allopurinol (ZYLOPRIM) 100 MG tablet, Take 1 tablet (100 mg total) by mouth daily., Disp: 90 tablet, Rfl: 3 .  amLODipine (NORVASC) 10 MG tablet, Take 1 tablet (10 mg total) by mouth daily., Disp: 90 tablet, Rfl: 1 .  atorvastatin (LIPITOR) 40 MG tablet, Take 1 tablet (40 mg total) by mouth daily., Disp: 90 tablet, Rfl: 3 .  losartan (COZAAR) 100 MG tablet, Take 1 tablet (100 mg total) by mouth daily., Disp: 90 tablet, Rfl: 3 .  magnesium oxide (MAG-OX) 400 MG tablet, One by mouth three days a day Friday (10/11) and Saturday(10/12) and Sunday (10/13), then one a day, Disp: , Rfl:  .  Omega 3 1000 MG CAPS, Take 3 capsules (3,000 mg total) by mouth daily., Disp: , Rfl:  .  thiamine (VITAMIN B-1) 100 MG tablet, Take 1 tablet (100 mg total) by mouth daily., Disp: 90 tablet, Rfl: 3  Patient Active Problem List   Diagnosis Date Noted  . Pure hypercholesterolemia 03/08/2019  . Nutritional anemia 03/08/2019  . Medication monitoring encounter 07/03/2017  . Hyperglycemia 11/28/2015  . Hyperlipidemia 04/24/2015  . Chronic kidney disease (CKD), stage II (mild) 09/20/2014  .  Chronic low back pain 09/20/2014  . Diverticulosis of colon 09/20/2014  . Failure of erection 09/20/2014  . Gout 09/20/2014  . Chronic pain of right knee 09/20/2014  . Alcohol abuse 09/20/2014  . Nondependent alcohol abuse, episodic drinking behavior 07/10/2008  . Decreased libido 10/20/2007  . Benign essential HTN 06/18/2007  . Combined fat and carbohydrate induced hyperlipemia 06/18/2007    Past Surgical History:  Procedure Laterality Date  . COLONOSCOPY WITH PROPOFOL N/A 10/10/2016   Procedure: COLONOSCOPY WITH PROPOFOL;  Surgeon: Wyline Mood, MD;  Location: [facility] ENDOSCOPY;  Service: Endoscopy;  Laterality: N/A;  . LUMBAR FUSION  10/12/2009  . LUMBAR LAMINECTOMY  10/12/2009  . REPLACEMENT TOTAL KNEE Right 08/2013    Family History  Problem Relation Age of Onset  . Coronary artery disease Mother   . Congestive Heart Failure Father   . Coronary artery disease Brother   . COPD Sister   . Breast cancer Sister 51  . Heart attack Cousin   . Diabetes Cousin     Social History   Tobacco Use  . Smoking status: Former Smoker    Types: Cigarettes    Quit date: 09/20/1978    Years since quitting: 41.5  . Smokeless tobacco: Never Used  Vaping Use  . Vaping Use: Never used  Substance Use Topics  . Alcohol use: Yes  Alcohol/week: 0.0 - 21.0 standard drinks    Comment: " i drink when I want to drink' stated pt  . Drug use: No     Allergies  Allergen Reactions  . Fluzone [Flu Virus Vaccine]     Health Maintenance  Topic Date Due  . INFLUENZA VACCINE  07/12/2020 (Originally 11/13/2019)  . COLONOSCOPY  10/10/2021  . TETANUS/TDAP  04/26/2022  . COVID-19 Vaccine  Completed  . Hepatitis C Screening  Completed  . PNA vac Low Risk Adult  Completed    Chart Review Today: I personally reviewed active problem list, medication list, allergies, family history, social history, health maintenance, notes from last encounter, lab results, imaging with the patient/caregiver  today.   Review of Systems  10 Systems reviewed and are negative for acute change except as noted in the HPI.  Objective:   Vitals:   03/22/20 0946  BP: 134/82  Pulse: 92  Resp: 18  Temp: 97.9 F (36.6 C)  TempSrc: Oral  SpO2: 98%  Weight: 205 lb 9.6 oz (93.3 kg)  Height: 6' (1.829 m)    Body mass index is 27.88 kg/m.  Physical Exam Vitals and nursing note reviewed.  Constitutional:      General: He is not in acute distress.    Appearance: Normal appearance. He is well-developed and well-groomed. He is not ill-appearing, toxic-appearing or diaphoretic.     Comments: Well-appearing, pleasant  HENT:     Head: Normocephalic and atraumatic.     Right Ear: External ear normal.     Left Ear: External ear normal.  Eyes:     General:        Right eye: No discharge.        Left eye: No discharge.     Conjunctiva/sclera: Conjunctivae normal.  Neck:     Trachea: No tracheal deviation.  Cardiovascular:     Rate and Rhythm: Normal rate and regular rhythm.     Pulses: Normal pulses.     Heart sounds: Normal heart sounds. No murmur heard. No friction rub. No gallop.   Pulmonary:     Effort: Pulmonary effort is normal. No respiratory distress.     Breath sounds: Normal breath sounds. No stridor. No wheezing, rhonchi or rales.  Abdominal:     General: Bowel sounds are normal. There is no distension.     Palpations: Abdomen is soft.  Musculoskeletal:        General: No swelling.     Right lower leg: No edema.     Left lower leg: No edema.  Skin:    General: Skin is warm and dry.     Findings: No rash.  Neurological:     Mental Status: He is alert. Mental status is at baseline.     Motor: No abnormal muscle tone.  Psychiatric:        Mood and Affect: Mood normal.        Behavior: Behavior normal. Behavior is cooperative.         Assessment & Plan:   Hypertension, unspecified type Patient here for blood pressure recheck after hypertensive urgency BP Readings  from Last 3 Encounters:  03/22/20 134/82  03/05/20 (!) 170/100  02/27/20 (!) 180/80  Patient appears less anxious, he has been compliant with medication changes, his blood pressure is significantly improved and at goal today He has taken amlodipine 10 mg in addition to his losartan 100 mg.  He endorses good compliance he has no side effects or concerns he feels  very good.   Exam shows he is well-hydrated, well-appearing, no lower extremity edema, cardiac exam unremarkable - RRR no M, G, R, lungs CTA A&P  We do not need to recheck his labs today with only the addition of amlodipine, encouraged him to continue to work on other healthy diet lifestyle changes to work on minimal alcohol intake and to ensure he has a low-salt diet and manages his stress.  Close follow-up in about 3 months, encouraged him to come sooner if he has any concerns, med side effects or new symptoms      Return in about 3 months (around 06/20/2020) for (please move back his 2/14 appt one month to early march for HTN HLD).   Danelle Berry, PA-C 03/22/20 10:05 AM

## 2020-04-26 DIAGNOSIS — H5211 Myopia, right eye: Secondary | ICD-10-CM | POA: Diagnosis not present

## 2020-05-28 ENCOUNTER — Ambulatory Visit: Payer: Medicare HMO | Admitting: Family Medicine

## 2020-05-29 DIAGNOSIS — H25013 Cortical age-related cataract, bilateral: Secondary | ICD-10-CM | POA: Diagnosis not present

## 2020-05-29 DIAGNOSIS — H2513 Age-related nuclear cataract, bilateral: Secondary | ICD-10-CM | POA: Diagnosis not present

## 2020-05-29 DIAGNOSIS — H2511 Age-related nuclear cataract, right eye: Secondary | ICD-10-CM | POA: Diagnosis not present

## 2020-05-29 DIAGNOSIS — H18413 Arcus senilis, bilateral: Secondary | ICD-10-CM | POA: Diagnosis not present

## 2020-05-29 DIAGNOSIS — H25043 Posterior subcapsular polar age-related cataract, bilateral: Secondary | ICD-10-CM | POA: Diagnosis not present

## 2020-05-29 DIAGNOSIS — H40023 Open angle with borderline findings, high risk, bilateral: Secondary | ICD-10-CM | POA: Diagnosis not present

## 2020-06-15 DIAGNOSIS — H2511 Age-related nuclear cataract, right eye: Secondary | ICD-10-CM | POA: Diagnosis not present

## 2020-06-15 DIAGNOSIS — H2512 Age-related nuclear cataract, left eye: Secondary | ICD-10-CM | POA: Diagnosis not present

## 2020-06-20 ENCOUNTER — Ambulatory Visit: Payer: Medicare HMO | Admitting: Family Medicine

## 2020-07-05 ENCOUNTER — Ambulatory Visit: Payer: Medicare HMO | Admitting: Family Medicine

## 2020-07-13 DIAGNOSIS — H2512 Age-related nuclear cataract, left eye: Secondary | ICD-10-CM | POA: Diagnosis not present

## 2020-07-31 ENCOUNTER — Ambulatory Visit: Payer: Medicare HMO

## 2020-07-31 ENCOUNTER — Ambulatory Visit: Payer: Self-pay | Admitting: Physician Assistant

## 2020-08-13 DIAGNOSIS — Z0100 Encounter for examination of eyes and vision without abnormal findings: Secondary | ICD-10-CM | POA: Diagnosis not present

## 2020-08-14 ENCOUNTER — Ambulatory Visit (INDEPENDENT_AMBULATORY_CARE_PROVIDER_SITE_OTHER): Payer: Medicare HMO

## 2020-08-14 ENCOUNTER — Other Ambulatory Visit: Payer: Self-pay

## 2020-08-14 VITALS — BP 122/72 | HR 98 | Temp 98.1°F | Resp 16 | Ht 72.0 in | Wt 206.5 lb

## 2020-08-14 DIAGNOSIS — Z0000 Encounter for general adult medical examination without abnormal findings: Secondary | ICD-10-CM | POA: Diagnosis not present

## 2020-08-14 DIAGNOSIS — Z599 Problem related to housing and economic circumstances, unspecified: Secondary | ICD-10-CM

## 2020-08-14 DIAGNOSIS — Z5941 Food insecurity: Secondary | ICD-10-CM | POA: Diagnosis not present

## 2020-08-14 DIAGNOSIS — Z7189 Other specified counseling: Secondary | ICD-10-CM

## 2020-08-14 NOTE — Progress Notes (Signed)
Subjective:   Lonnie Huber is a 75 y.o. male who presents for Medicare Annual/Subsequent preventive examination.  Review of Systems     Cardiac Risk Factors include: advanced age (>19men, >63 women);dyslipidemia;male gender;hypertension     Objective:    Today's Vitals   08/14/20 0822  BP: 122/72  Pulse: 98  Resp: 16  Temp: 98.1 F (36.7 C)  TempSrc: Oral  SpO2: 99%  Weight: 206 lb 8 oz (93.7 kg)  Height: 6' (1.829 m)   Body mass index is 28.01 kg/m.  Advanced Directives 08/14/2020 07/28/2019 02/09/2018 01/27/2018 01/06/2018 10/10/2016 08/19/2016  Does Patient Have a Medical Advance Directive? No No - No No No No  Would patient like information on creating a medical advance directive? No - Patient declined No - Patient declined No - Patient declined No - Patient declined No - Patient declined No - Patient declined -    Current Medications (verified) Outpatient Encounter Medications as of 08/14/2020  Medication Sig  . allopurinol (ZYLOPRIM) 100 MG tablet Take 1 tablet (100 mg total) by mouth daily.  Marland Kitchen amLODipine (NORVASC) 10 MG tablet Take 1 tablet (10 mg total) by mouth daily.  Marland Kitchen atorvastatin (LIPITOR) 40 MG tablet Take 1 tablet (40 mg total) by mouth daily.  Marland Kitchen losartan (COZAAR) 100 MG tablet Take 1 tablet (100 mg total) by mouth daily.  . magnesium oxide (MAG-OX) 400 MG tablet One by mouth three days a day Friday (10/11) and Saturday(10/12) and Sunday (10/13), then one a day  . Omega 3 1000 MG CAPS Take 3 capsules (3,000 mg total) by mouth daily.  Marland Kitchen thiamine (VITAMIN B-1) 100 MG tablet Take 1 tablet (100 mg total) by mouth daily.   No facility-administered encounter medications on file as of 08/14/2020.    Allergies (verified) Fluzone [influenza virus vaccine]   History: Past Medical History:  Diagnosis Date  . Gout   . Hyperlipidemia   . Hypertension   . Thyroid disease    Past Surgical History:  Procedure Laterality Date  . COLONOSCOPY WITH PROPOFOL N/A  10/10/2016   Procedure: COLONOSCOPY WITH PROPOFOL;  Surgeon: Wyline Mood, MD;  Location: [facility] ENDOSCOPY;  Service: Endoscopy;  Laterality: N/A;  . LUMBAR FUSION  10/12/2009  . LUMBAR LAMINECTOMY  10/12/2009  . REPLACEMENT TOTAL KNEE Right 08/2013   Family History  Problem Relation Age of Onset  . Coronary artery disease Mother   . Congestive Heart Failure Father   . Coronary artery disease Brother   . COPD Sister   . Breast cancer Sister 39  . Heart attack Cousin   . Diabetes Cousin    Social History   Socioeconomic History  . Marital status: Married    Spouse name: Not on file  . Number of children: Not on file  . Years of education: Not on file  . Highest education level: Not on file  Occupational History  . Occupation: retired  Tobacco Use  . Smoking status: Former Smoker    Types: Cigarettes    Quit date: 09/20/1978    Years since quitting: 41.9  . Smokeless tobacco: Never Used  Vaping Use  . Vaping Use: Never used  Substance and Sexual Activity  . Alcohol use: Yes    Alcohol/week: 0.0 - 21.0 standard drinks    Comment: " i drink when I want to drink' stated pt  . Drug use: No  . Sexual activity: Yes  Other Topics Concern  . Not on file  Social History Narrative  .  Not on file   Social Determinants of Health   Financial Resource Strain: High Risk  . Difficulty of Paying Living Expenses: Hard  Food Insecurity: Food Insecurity Present  . Worried About Programme researcher, broadcasting/film/video in the Last Year: Sometimes true  . Ran Out of Food in the Last Year: Never true  Transportation Needs: No Transportation Needs  . Lack of Transportation (Medical): No  . Lack of Transportation (Non-Medical): No  Physical Activity: Inactive  . Days of Exercise per Week: 0 days  . Minutes of Exercise per Session: 0 min  Stress: No Stress Concern Present  . Feeling of Stress : Only a little  Social Connections: Moderately Isolated  . Frequency of Communication with Friends and Family: More  than three times a week  . Frequency of Social Gatherings with Friends and Family: Three times a week  . Attends Religious Services: Never  . Active Member of Clubs or Organizations: No  . Attends Banker Meetings: Never  . Marital Status: Married    Tobacco Counseling Counseling given: Not Answered   Clinical Intake:  Pre-visit preparation completed: Yes  Pain : No/denies pain     BMI - recorded: 28.01 Nutritional Status: BMI 25 -29 Overweight Nutritional Risks: None Diabetes: No  How often do you need to have someone help you when you read instructions, pamphlets, or other written materials from your doctor or pharmacy?: 1 - Never    Interpreter Needed?: No  Information entered by :: Reather Littler LPN   Activities of Daily Living In your present state of health, do you have any difficulty performing the following activities: 08/14/2020 03/22/2020  Hearing? N N  Comment declines hearing aids -  Vision? N N  Difficulty concentrating or making decisions? N N  Walking or climbing stairs? Y N  Dressing or bathing? N N  Doing errands, shopping? N N  Preparing Food and eating ? N -  Using the Toilet? N -  In the past six months, have you accidently leaked urine? Y -  Do you have problems with loss of bowel control? N -  Managing your Medications? N -  Managing your Finances? N -  Housekeeping or managing your Housekeeping? N -  Some recent data might be hidden    Patient Care Team: Danelle Berry, PA-C as PCP - General (Family Medicine) Morene Crocker, MD as Referring Physician (Neurology) Andee Poles, [facility] (Pharmacist)  Indicate any recent Medical Services you may have received from other than [facility] providers in the past year (date may be approximate).     Assessment:   This is a routine wellness examination for Lonnie Huber.  Hearing/Vision screen  Hearing Screening   125Hz  250Hz  500Hz  1000Hz  2000Hz  3000Hz  4000Hz  6000Hz  8000Hz   Right ear:            Left ear:           Comments: Pt denies hearing difficulty  Vision Screening Comments: Annual vision screenings done by [facility]  Dietary issues and exercise activities discussed: Current Exercise Habits: The patient does not participate in regular exercise at present, Exercise limited by: orthopedic condition(s)  Goals Addressed   None    Depression Screen PHQ 2/9 Scores 08/14/2020 03/22/2020 02/27/2020 08/26/2019 07/28/2019 04/28/2019 03/08/2019  PHQ - 2 Score 2 0 1 1 0 1 0  PHQ- 9 Score 4 - - 1 - 1 0    Fall Risk Fall Risk  08/14/2020 03/22/2020 02/27/2020 08/26/2019 07/28/2019  Falls  in the past year? 0 0 0 0 0  Number falls in past yr: 0 0 0 0 0  Injury with Fall? 0 0 0 0 0  Risk for fall due to : Impaired balance/gait - - - No Fall Risks  Follow up Falls prevention discussed Falls evaluation completed - Falls evaluation completed Falls prevention discussed    FALL RISK PREVENTION PERTAINING TO THE HOME:  Any stairs in or around the home? Yes  If so, are there any without handrails? Yes  - front steps unstable and no hand rail Home free of loose throw rugs in walkways, pet beds, electrical cords, etc? Yes  Adequate lighting in your home to reduce risk of falls? Yes   ASSISTIVE DEVICES UTILIZED TO PREVENT FALLS:  Life alert? No  Use of a cane, walker or w/c? No  Grab bars in the bathroom? No  Shower chair or bench in shower? No  Elevated toilet seat or a handicapped toilet? No   TIMED UP AND GO:  Was the test performed? Yes .  Length of time to ambulate 10 feet: 6 sec.   Gait steady and fast without use of assistive device  Cognitive Function:     6CIT Screen 08/14/2020 07/28/2019 05/19/2016  What Year? 0 points 0 points 0 points  What month? 0 points 0 points 0 points  What time? 0 points 0 points 0 points  Count back from 20 0 points 0 points 0 points  Months in reverse 4 points 4 points 4 points  Repeat phrase 2 points 0 points 8 points  Total Score 6 4 12      Immunizations Immunization History  Administered Date(s) Administered  . Janssen (J&J) SARS-COV-2 Vaccination 06/25/2019, 03/15/2020  . Pneumococcal Conjugate-13 05/19/2016  . Pneumococcal Polysaccharide-23 12/21/2014  . Tdap 04/26/2012  . Zoster 04/26/2012     TDAP status: Up to date  Flu vaccine status: n/a due to allergy  Pneumococcal vaccine status: Up to date  Covid-19 vaccine status: Completed vaccines  Qualifies for Shingles Vaccine? Yes   Zostavax completed Yes   Shingrix Completed?: No.    Education has been provided regarding the importance of this vaccine. Patient has been advised to call insurance company to determine out of pocket expense if they have not yet received this vaccine. Advised may also receive vaccine at local pharmacy or Health Dept. Verbalized acceptance and understanding.  Screening Tests Health Maintenance  Topic Date Due  . INFLUENZA VACCINE  11/12/2020  . COLONOSCOPY (Pts [id number] Insurance coverage will need to be confirmed)  10/10/2021  . TETANUS/TDAP  04/26/2022  . COVID-19 Vaccine  Completed  . Hepatitis C Screening  Completed  . PNA vac Low Risk Adult  Completed  . HPV VACCINES  Aged Out    Health Maintenance  There are no preventive care reminders to display for this patient.  Colorectal cancer screening: Type of screening: Colonoscopy. Completed 10/10/16. Repeat every 5 years  Lung Cancer Screening: (Low Dose CT Chest recommended if Age 69-80 years, 30 pack-year currently smoking OR have quit w/in 15years.) does not qualify.     Additional Screening:  Hepatitis C Screening: does qualify; Completed 05/19/16  Vision Screening: Recommended annual ophthalmology exams for early detection of glaucoma and other disorders of the eye. Is the patient up to date with their annual eye exam?  Yes  Who is the provider or what is the name of the office in which the patient attends annual eye exams? [facility].  Dental Screening:  Recommended annual dental exams for proper oral hygiene  Community Resource Referral / Chronic Care Management: CRR required this visit?  Yes   CCM required this visit?  No      Plan:     I have personally reviewed and noted the following in the patient's chart:   . Medical and social history . Use of alcohol, tobacco or illicit drugs  . Current medications and supplements including opioid prescriptions. Patient is not currently taking opioid prescriptions. . Functional ability and status . Nutritional status . Physical activity . Advanced directives . List of other physicians . Hospitalizations, surgeries, and ER visits in previous 12 months . Vitals . Screenings to include cognitive, depression, and falls . Referrals and appointments  In addition, I have reviewed and discussed with patient certain preventive protocols, quality metrics, and best practice recommendations. A written personalized care plan for preventive services as well as general preventive health recommendations were provided to patient.     Reather Littler, LPN   07/13/5828   Nurse Notes: pt advised due for 6 mo follow up

## 2020-08-14 NOTE — Patient Instructions (Signed)
Lonnie Huber , Thank you for taking time to come for your Medicare Wellness Visit. I appreciate your ongoing commitment to your health goals. Please review the following plan we discussed and let me know if I can assist you in the future.   Screening recommendations/referrals: Colonoscopy: done 10/10/16. Repeat in 2023 Recommended yearly ophthalmology/optometry visit for glaucoma screening and checkup Recommended yearly dental visit for hygiene and checkup  Vaccinations: Influenza vaccine: n/a Pneumococcal vaccine: done 05/19/16 Tdap vaccine: done 04/26/12 Shingles vaccine: Shingrix discussed. Please contact your pharmacy for coverage information.  Covid-19:  Done 06/25/19 & 03/15/20  Advanced directives: Advance directive discussed with you today. Even though you declined this today please call our office should you change your mind and we can give you the proper paperwork for you to fill out.  Conditions/risks identified: Recommend increasing physical activity   Next appointment: Follow up in one year for your annual wellness visit.   Preventive Care 61 Years and Older, Male Preventive care refers to lifestyle choices and visits with your health care provider that can promote health and wellness. What does preventive care include?  A yearly physical exam. This is also called an annual well check.  Dental exams once or twice a year.  Routine eye exams. Ask your health care provider how often you should have your eyes checked.  Personal lifestyle choices, including:  Daily care of your teeth and gums.  Regular physical activity.  Eating a healthy diet.  Avoiding tobacco and drug use.  Limiting alcohol use.  Practicing safe sex.  Taking low doses of aspirin every day.  Taking vitamin and mineral supplements as recommended by your health care provider. What happens during an annual well check? The services and screenings done by your health care provider during your annual well  check will depend on your age, overall health, lifestyle risk factors, and family history of disease. Counseling  Your health care provider may ask you questions about your:  Alcohol use.  Tobacco use.  Drug use.  Emotional well-being.  Home and relationship well-being.  Sexual activity.  Eating habits.  History of falls.  Memory and ability to understand (cognition).  Work and work Astronomer. Screening  You may have the following tests or measurements:  Height, weight, and BMI.  Blood pressure.  Lipid and cholesterol levels. These may be checked every 5 years, or more frequently if you are over 4 years old.  Skin check.  Lung cancer screening. You may have this screening every year starting at age 70 if you have a 30-pack-year history of smoking and currently smoke or have quit within the past 15 years.  Fecal occult blood test (FOBT) of the stool. You may have this test every year starting at age 19.  Flexible sigmoidoscopy or colonoscopy. You may have a sigmoidoscopy every 5 years or a colonoscopy every 10 years starting at age 65.  Prostate cancer screening. Recommendations will vary depending on your family history and other risks.  Hepatitis C blood test.  Hepatitis B blood test.  Sexually transmitted disease (STD) testing.  Diabetes screening. This is done by checking your blood sugar (glucose) after you have not eaten for a while (fasting). You may have this done every 1-3 years.  Abdominal aortic aneurysm (AAA) screening. You may need this if you are a current or former smoker.  Osteoporosis. You may be screened starting at age 41 if you are at high risk. Talk with your health care provider about your test results,  treatment options, and if necessary, the need for more tests. Vaccines  Your health care provider may recommend certain vaccines, such as:  Influenza vaccine. This is recommended every year.  Tetanus, diphtheria, and acellular  pertussis (Tdap, Td) vaccine. You may need a Td booster every 10 years.  Zoster vaccine. You may need this after age 46.  Pneumococcal 13-valent conjugate (PCV13) vaccine. One dose is recommended after age 85.  Pneumococcal polysaccharide (PPSV23) vaccine. One dose is recommended after age 66. Talk to your health care provider about which screenings and vaccines you need and how often you need them. This information is not intended to replace advice given to you by your health care provider. Make sure you discuss any questions you have with your health care provider. Document Released: 04/27/2015 Document Revised: 12/19/2015 Document Reviewed: 01/30/2015 Elsevier Interactive Patient Education  2017 [place] Prevention in the Home Falls can cause injuries. They can happen to people of all ages. There are many things you can do to make your home safe and to help prevent falls. What can I do on the outside of my home?  Regularly fix the edges of walkways and driveways and fix any cracks.  Remove anything that might make you trip as you walk through a door, such as a raised step or threshold.  Trim any bushes or trees on the path to your home.  Use bright outdoor lighting.  Clear any walking paths of anything that might make someone trip, such as rocks or tools.  Regularly check to see if handrails are loose or broken. Make sure that both sides of any steps have handrails.  Any raised decks and porches should have guardrails on the edges.  Have any leaves, snow, or ice cleared regularly.  Use sand or salt on walking paths during winter.  Clean up any spills in your garage right away. This includes oil or grease spills. What can I do in the bathroom?  Use night lights.  Install grab bars by the toilet and in the tub and shower. Do not use towel bars as grab bars.  Use non-skid mats or decals in the tub or shower.  If you need to sit down in the shower, use a plastic,  non-slip stool.  Keep the floor dry. Clean up any water that spills on the floor as soon as it happens.  Remove soap buildup in the tub or shower regularly.  Attach bath mats securely with double-sided non-slip rug tape.  Do not have throw rugs and other things on the floor that can make you trip. What can I do in the bedroom?  Use night lights.  Make sure that you have a light by your bed that is easy to reach.  Do not use any sheets or blankets that are too big for your bed. They should not hang down onto the floor.  Have a firm chair that has side arms. You can use this for support while you get dressed.  Do not have throw rugs and other things on the floor that can make you trip. What can I do in the kitchen?  Clean up any spills right away.  Avoid walking on wet floors.  Keep items that you use a lot in easy-to-reach places.  If you need to reach something above you, use a strong step stool that has a grab bar.  Keep electrical cords out of the way.  Do not use floor polish or wax that makes floors  slippery. If you must use wax, use non-skid floor wax.  Do not have throw rugs and other things on the floor that can make you trip. What can I do with my stairs?  Do not leave any items on the stairs.  Make sure that there are handrails on both sides of the stairs and use them. Fix handrails that are broken or loose. Make sure that handrails are as long as the stairways.  Check any carpeting to make sure that it is firmly attached to the stairs. Fix any carpet that is loose or worn.  Avoid having throw rugs at the top or bottom of the stairs. If you do have throw rugs, attach them to the floor with carpet tape.  Make sure that you have a light switch at the top of the stairs and the bottom of the stairs. If you do not have them, ask someone to add them for you. What else can I do to help prevent falls?  Wear shoes that:  Do not have high heels.  Have rubber  bottoms.  Are comfortable and fit you well.  Are closed at the toe. Do not wear sandals.  If you use a stepladder:  Make sure that it is fully opened. Do not climb a closed stepladder.  Make sure that both sides of the stepladder are locked into place.  Ask someone to hold it for you, if possible.  Clearly mark and make sure that you can see:  Any grab bars or handrails.  First and last steps.  Where the edge of each step is.  Use tools that help you move around (mobility aids) if they are needed. These include:  Canes.  Walkers.  Scooters.  Crutches.  Turn on the lights when you go into a dark area. Replace any light bulbs as soon as they burn out.  Set up your furniture so you have a clear path. Avoid moving your furniture around.  If any of your floors are uneven, fix them.  If there are any pets around you, be aware of where they are.  Review your medicines with your doctor. Some medicines can make you feel dizzy. This can increase your chance of falling. Ask your doctor what other things that you can do to help prevent falls. This information is not intended to replace advice given to you by your health care provider. Make sure you discuss any questions you have with your health care provider. Document Released: 01/25/2009 Document Revised: 09/06/2015 Document Reviewed: 05/05/2014 Elsevier Interactive Patient Education  2017 [employer].

## 2020-08-17 ENCOUNTER — Telehealth: Payer: Self-pay | Admitting: *Deleted

## 2020-08-17 NOTE — Telephone Encounter (Signed)
   Telephone encounter was:  Successful.  08/17/2020 Name: Lonnie Huber MRN: [id number] DOB: [birth year]  [name] is a 75 y.o. year old male who is a primary care patient of Danelle Berry, [place] . The community resource team was consulted for assistance with Transportation Needs  and Food Insecurity  Care guide performed the following interventions: Patient provided with information about care guide support team and interviewed to confirm resource needs.  Follow Up Plan:  Care guide will follow up with patient by phone over the next monday  Burnice Vassel Greenauer -[facility] Guide , Embedded Care Coordination [facility], Care Management  [phone] [address] [place] , [place] [address] Email : [email or web address]

## 2020-08-20 ENCOUNTER — Telehealth: Payer: Self-pay | Admitting: *Deleted

## 2020-08-20 NOTE — Telephone Encounter (Signed)
   Telephone encounter was:  Successful.  08/20/2020 Name: ROWAN BLAKER MRN: [id number] DOB: 1945/05/29  Joseph Art Gaal is a 75 y.o. year old male who is a primary care patient of Danelle Berry, [place] . The community resource team was consulted for assistance with Food Insecurity  Care guide performed the following interventions: Patient provided with information about care guide support team and interviewed to confirm resource needs Follow up call placed to community resources to determine status of patients referral.  Follow Up Plan:  Care guide will follow up with patient by phone over the next Patient wife followed up and I was able to give her information about area services and she is going to email me her utility bill   Yehuda Mao Greenauer -[facility] Guide , Embedded Care Coordination [facility], Care Management  [phone] [address] [place] , [place] [address] Email : [email or web address]

## 2020-08-27 ENCOUNTER — Telehealth: Payer: Self-pay | Admitting: *Deleted

## 2020-08-27 NOTE — Telephone Encounter (Signed)
   Telephone encounter was:  Successful.  08/27/2020 Name: Lonnie Huber MRN: [id number] DOB: 11-04-1945  Lonnie Huber is a 75 y.o. year old male who is a primary care patient of Danelle Berry, [place] . The community resource team was consulted for assistance with Transportation Needs , Food Insecurity and Financial Difficulties related to utilities  Care guide performed the following interventions: Patient provided with information about care guide support team and interviewed to confirm resource needs Follow up call placed to community resources to determine status of patients referral Follow up call placed to the patient to discuss status of referral.  Follow Up Plan:  No further follow up planned at this time. The patient has been provided with needed resources.   Alois Cliche -[facility] Guide , Embedded Care Coordination [facility], Care Management  [phone] [address] [place] , [place] [address] Email : [email or web address]

## 2020-09-04 ENCOUNTER — Encounter: Payer: Self-pay | Admitting: Unknown Physician Specialty

## 2020-09-04 ENCOUNTER — Ambulatory Visit (INDEPENDENT_AMBULATORY_CARE_PROVIDER_SITE_OTHER): Payer: Medicare HMO | Admitting: Unknown Physician Specialty

## 2020-09-04 ENCOUNTER — Telehealth: Payer: Self-pay | Admitting: *Deleted

## 2020-09-04 ENCOUNTER — Other Ambulatory Visit: Payer: Self-pay

## 2020-09-04 VITALS — BP 122/70 | HR 94 | Temp 98.3°F | Resp 16 | Ht 72.0 in | Wt 205.7 lb

## 2020-09-04 DIAGNOSIS — E782 Mixed hyperlipidemia: Secondary | ICD-10-CM | POA: Diagnosis not present

## 2020-09-04 DIAGNOSIS — I10 Essential (primary) hypertension: Secondary | ICD-10-CM | POA: Diagnosis not present

## 2020-09-04 DIAGNOSIS — M109 Gout, unspecified: Secondary | ICD-10-CM

## 2020-09-04 DIAGNOSIS — E7800 Pure hypercholesterolemia, unspecified: Secondary | ICD-10-CM | POA: Diagnosis not present

## 2020-09-04 DIAGNOSIS — Z5181 Encounter for therapeutic drug level monitoring: Secondary | ICD-10-CM

## 2020-09-04 DIAGNOSIS — R2681 Unsteadiness on feet: Secondary | ICD-10-CM | POA: Diagnosis not present

## 2020-09-04 MED ORDER — ALLOPURINOL 100 MG PO TABS: 100.0000 mg | ORAL_TABLET | Freq: Every day | ORAL | 3 refills | Status: DC

## 2020-09-04 MED ORDER — AMLODIPINE BESYLATE 10 MG PO TABS: 10.0000 mg | ORAL_TABLET | Freq: Every day | ORAL | 1 refills | Status: DC

## 2020-09-04 MED ORDER — ATORVASTATIN CALCIUM 40 MG PO TABS: 40.0000 mg | ORAL_TABLET | Freq: Every day | ORAL | 3 refills | Status: DC

## 2020-09-04 MED ORDER — LOSARTAN POTASSIUM 100 MG PO TABS
100.0000 mg | ORAL_TABLET | Freq: Every day | ORAL | 3 refills | Status: DC
Start: 2020-09-04 — End: 2020-12-24

## 2020-09-04 NOTE — Assessment & Plan Note (Signed)
Stable, continue present medications.   

## 2020-09-04 NOTE — Assessment & Plan Note (Signed)
Stable.  Continue present medications 

## 2020-09-04 NOTE — Chronic Care Management (AMB) (Signed)
  Chronic Care Management   Outreach Note  09/04/2020 Name: Lonnie Huber MRN: [id number] DOB: Oct 06, 1945  Lonnie Huber is a 75 y.o. year old male who is a primary care patient of Danelle Berry, [place]. I reached out to [employer] by phone today in response to a referral sent by Mr. Lonnie Huber's PCP, Danelle Berry, PA-C.     An unsuccessful telephone outreach was attempted today. The patient was referred to the case management team for assistance with care management and care coordination.   Follow Up Plan: A HIPAA compliant phone message was left for the patient providing contact information and requesting a return call. The care management team will reach out to the patient again over the next 7 days. If patient returns call to provider office, please advise to call Embedded Care Management Care Guide Gwenevere Ghazi at [phone].  Gwenevere Ghazi  Care Guide, Embedded Care Coordination [facility] Management

## 2020-09-04 NOTE — Progress Notes (Signed)
BP 122/70   Pulse 94   Temp 98.3 F (36.8 C) (Oral)   Resp 16   Ht 6' (1.829 m)   Wt 205 lb 11.2 oz (93.3 kg)   SpO2 99%   BMI 27.90 kg/m    Subjective:    Patient ID: Lonnie Huber, male    DOB: 09/30/1945, 75 y.o.   MRN: [id number]  HPI: Lonnie Huber is a 75 y.o. male  Chief Complaint  Patient presents with  . Hypertension  . Hyperlipidemia    Follow up, medication refills    Hypertension Using medications without difficulty Average home BPs not checking   No problems or lightheadedness No chest pain with exertion or shortness of breath No Edema   Hyperlipidemia Using medications without problems: No Muscle aches  Diet compliance: Issues with what he can afford Exercise[place] [place] [facility] [place].  Stopped because he stumbled once.   Applied for services to help with rehab of his home.  Also needs to know about other services  Gout On Allopurinol, not on max dose, but no more gout flares  Relevant past medical, surgical, family and social history reviewed and updated as indicated. Interim medical history since our last visit reviewed. Allergies and medications reviewed and updated.  Review of Systems  Constitutional: Negative.   HENT: Negative.   Eyes: Negative.   Respiratory: Negative.   Cardiovascular: Negative.   Gastrointestinal: Negative.   Endocrine: Negative.   Genitourinary: Negative.   Musculoskeletal: Negative.   Psychiatric/Behavioral: Negative.     Per HPI unless specifically indicated above     Objective:    BP 122/70   Pulse 94   Temp 98.3 F (36.8 C) (Oral)   Resp 16   Ht 6' (1.829 m)   Wt 205 lb 11.2 oz (93.3 kg)   SpO2 99%   BMI 27.90 kg/m   Wt Readings from Last 3 Encounters:  09/04/20 205 lb 11.2 oz (93.3 kg)  08/14/20 206 lb 8 oz (93.7 kg)  03/22/20 205 lb 9.6 oz (93.3 kg)    Physical Exam Constitutional:      General: He is not in acute distress.    Appearance: Normal  appearance. He is well-developed.  HENT:     Head: Normocephalic and atraumatic.  Eyes:     General: Lids are normal. No scleral icterus.       Right eye: No discharge.        Left eye: No discharge.     Conjunctiva/sclera: Conjunctivae normal.  Neck:     Vascular: No carotid bruit or JVD.  Cardiovascular:     Rate and Rhythm: Normal rate and regular rhythm.     Heart sounds: Normal heart sounds.  Pulmonary:     Effort: Pulmonary effort is normal. No respiratory distress.     Breath sounds: Normal breath sounds.  Abdominal:     Palpations: There is no hepatomegaly or splenomegaly.  Musculoskeletal:        General: Normal range of motion.     Cervical back: Normal range of motion and neck supple.  Skin:    General: Skin is warm and dry.     Coloration: Skin is not pale.     Findings: No rash.  Neurological:     Mental Status: He is alert and oriented to person, place, and time.  Psychiatric:        Behavior: Behavior normal.  Thought Content: Thought content normal.        Judgment: Judgment normal.     Results for orders placed or performed in visit on 02/27/20  CBC with Differential/Platelet  Result Value Ref Range   WBC 5.7 3.8 - 10.8 Thousand/uL   RBC 4.87 4.20 - 5.80 Million/uL   Hemoglobin 14.4 13.2 - 17.1 g/dL   HCT 93.5 70.1 - 77.9 %   MCV 88.5 80.0 - 100.0 fL   MCH 29.6 27.0 - 33.0 pg   MCHC 33.4 32.0 - 36.0 g/dL   RDW 39.0 30.0 - 92.3 %   Platelets 175 140 - 400 Thousand/uL   MPV 11.2 7.5 - 12.5 fL   Neutro Abs 3,169 1,500 - 7,800 cells/uL   Lymphs Abs 1,864 850 - 3,900 cells/uL   Absolute Monocytes 536 200 - 950 cells/uL   Eosinophils Absolute 108 15 - 500 cells/uL   Basophils Absolute 23 0 - 200 cells/uL   Neutrophils Relative % 55.6 %   Total Lymphocyte 32.7 %   Monocytes Relative 9.4 %   Eosinophils Relative 1.9 %   Basophils Relative 0.4 %  COMPLETE METABOLIC PANEL WITH GFR  Result Value Ref Range   Glucose, Bld 100 (H) 65 - 99 mg/dL    BUN 14 7 - 25 mg/dL   Creat 3.00 (H) 7.62 - 1.18 mg/dL   GFR, Est Non African American 58 (L) > OR = 60 mL/min/1.45m2   GFR, Est African American 67 > OR = 60 mL/min/1.75m2   BUN/Creatinine Ratio 11 6 - 22 (calc)   Sodium 136 135 - 146 mmol/L   Potassium 4.5 3.5 - 5.3 mmol/L   Chloride 102 98 - 110 mmol/L   CO2 28 20 - 32 mmol/L   Calcium 9.3 8.6 - 10.3 mg/dL   Total Protein 6.6 6.1 - 8.1 g/dL   Albumin 3.8 3.6 - 5.1 g/dL   Globulin 2.8 1.9 - 3.7 g/dL (calc)   AG Ratio 1.4 1.0 - 2.5 (calc)   Total Bilirubin 1.0 0.2 - 1.2 mg/dL   Alkaline phosphatase (APISO) 58 35 - 144 U/L   AST 24 10 - 35 U/L   ALT 19 9 - 46 U/L  Lipid panel  Result Value Ref Range   Cholesterol 133 <200 mg/dL   HDL 55 > OR = 40 mg/dL   Triglycerides 69 <263 mg/dL   LDL Cholesterol (Calc) 63 mg/dL (calc)   Total CHOL/HDL Ratio 2.4 <5.0 (calc)   Non-HDL Cholesterol (Calc) 78 <335 mg/dL (calc)      Assessment & Plan:   Problem List Items Addressed This Visit      Unprioritized   Benign essential HTN (Chronic)    Stable, continue present medications.       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        Relevant Medications   atorvastatin (LIPITOR) 40 MG tablet   losartan (COZAAR) 100 MG tablet   amLODipine (NORVASC) 10 MG tablet   Gout   Relevant Medications   allopurinol (ZYLOPRIM) 100 MG tablet   Hyperlipidemia (Chronic)    Stable. Continue present medications      Relevant Medications   atorvastatin (LIPITOR) 40 MG tablet   losartan (COZAAR) 100 MG tablet   amLODipine (NORVASC) 10 MG tablet   Medication monitoring encounter  Labs were normal 6 months ago.  Recheck next f/u visit      Relevant Medications   atorvastatin (LIPITOR) 40 MG tablet   allopurinol (ZYLOPRIM) 100 MG tablet    Other  Visit Diagnoses    Unsteady gait when walking    -  Primary   Plus struggles with financial resources.  Will refer to social work   Relevant Orders   AMB Referral to [facility] Coordinaton   Hypertension, unspecified type       Relevant Medications   atorvastatin (LIPITOR) 40 MG tablet   losartan (COZAAR) 100 MG tablet   amLODipine (NORVASC) 10 MG tablet       Follow up plan: Return in about 6 months (around 03/07/2021).

## 2020-09-04 NOTE — Assessment & Plan Note (Signed)
Labs were normal 6 months ago.  Recheck next f/u visit

## 2020-09-11 NOTE — Chronic Care Management (AMB) (Signed)
  Chronic Care Management   Outreach Note  09/11/2020 Name: EMARION TORAL MRN: [id number] DOB: 07-14-1945  Joseph Art Kassin is a 75 y.o. year old male who is a primary care patient of Danelle Berry, [place]. I reached out to [employer] by phone today in response to a referral sent by Mr. Auden Tatar Agcaoili's PCP, Danelle Berry PA-C.     A second unsuccessful telephone outreach was attempted today. The patient was referred to the case management team for assistance with care management and care coordination.   Follow Up Plan: A HIPAA compliant phone message was left for the patient providing contact information and requesting a return call. The care management team will reach out to the patient again over the next 7 days. If patient returns call to provider office, please advise to call Embedded Care Management Care Guide Gwenevere Ghazi at [phone].  Gwenevere Ghazi  Care Guide, Embedded Care Coordination [facility] Management

## 2020-09-19 NOTE — Chronic Care Management (AMB) (Signed)
  Chronic Care Management   Outreach Note  09/19/2020 Name: [name] MRN: [id number] DOB: 10/19/1945  Lonnie Huber is a 75 y.o. year old male who is a primary care patient of Danelle Berry, [place]. I reached out to [employer] by phone today in response to a referral sent by Mr. Waymon Laser Sulkowski's PCP, Danelle Berry, PA-C.     Third unsuccessful telephone outreach was attempted today. The patient was referred to the case management team for assistance with care management and care coordination. The patient's primary care provider has been notified of our unsuccessful attempts to make or maintain contact with the patient. The care management team is pleased to engage with this patient at any time in the future should he/she be interested in assistance from the care management team.   Follow Up Plan: A HIPAA compliant phone message was left for the patient providing contact information and requesting a return call. If patient returns call to provider office, please advise to call Embedded Care Management Care Guide Gwenevere Ghazi at [phone].  Gwenevere Ghazi  Care Guide, Embedded Care Coordination [facility] Management

## 2020-12-21 ENCOUNTER — Other Ambulatory Visit: Payer: Self-pay | Admitting: Family Medicine

## 2021-03-05 ENCOUNTER — Encounter: Payer: Self-pay | Admitting: Family Medicine

## 2021-03-05 ENCOUNTER — Ambulatory Visit (INDEPENDENT_AMBULATORY_CARE_PROVIDER_SITE_OTHER): Payer: Medicare HMO | Admitting: Family Medicine

## 2021-03-05 ENCOUNTER — Other Ambulatory Visit: Payer: Self-pay

## 2021-03-05 VITALS — BP 118/62 | HR 99 | Temp 97.7°F | Resp 16 | Ht 72.0 in | Wt 209.9 lb

## 2021-03-05 DIAGNOSIS — Z8249 Family history of ischemic heart disease and other diseases of the circulatory system: Secondary | ICD-10-CM

## 2021-03-05 DIAGNOSIS — I499 Cardiac arrhythmia, unspecified: Secondary | ICD-10-CM | POA: Diagnosis not present

## 2021-03-05 DIAGNOSIS — E7800 Pure hypercholesterolemia, unspecified: Secondary | ICD-10-CM

## 2021-03-05 DIAGNOSIS — M109 Gout, unspecified: Secondary | ICD-10-CM | POA: Diagnosis not present

## 2021-03-05 DIAGNOSIS — D539 Nutritional anemia, unspecified: Secondary | ICD-10-CM

## 2021-03-05 DIAGNOSIS — I10 Essential (primary) hypertension: Secondary | ICD-10-CM

## 2021-03-05 DIAGNOSIS — R2681 Unsteadiness on feet: Secondary | ICD-10-CM

## 2021-03-05 DIAGNOSIS — N1831 Chronic kidney disease, stage 3a: Secondary | ICD-10-CM

## 2021-03-05 DIAGNOSIS — Z5181 Encounter for therapeutic drug level monitoring: Secondary | ICD-10-CM | POA: Diagnosis not present

## 2021-03-05 DIAGNOSIS — F1010 Alcohol abuse, uncomplicated: Secondary | ICD-10-CM

## 2021-03-05 NOTE — Progress Notes (Signed)
Name: Lonnie Huber   MRN: [id number]    DOB: 1945/06/08   Date:03/05/2021       Progress Note  Chief Complaint  Patient presents with   Follow-up   Hypertension   Hyperlipidemia     Subjective:   Lonnie Huber is a 75 y.o. male, presents to clinic for routine follow up  Hypertension:  Currently managed on losartan 100 and amlodipine 10 Pt reports good med compliance and denies any SE.   Blood pressure today is well controlled.   About a year ago patient return to clinic with very elevated blood pressures and medication restarted and then dose is increased and he has since been well controlled BP Readings from Last 3 Encounters:  03/05/21 118/62  09/04/20 122/70  08/14/20 122/72   Pt denies CP, SOB, exertional sx, LE edema, palpitation, Ha's, visual disturbances, lightheadedness, hypotension, syncope.   Hyperlipidemia:  well controlled on lipitor 40 and omega-3 fatty acid supplement pt reports good med compliance  Last Lipids: Lab Results  Component Value Date   CHOL 133 02/27/2020   HDL 55 02/27/2020   LDLCALC 63 02/27/2020   TRIG 69 02/27/2020   CHOLHDL 2.4 02/27/2020   - Denies: Chest pain, shortness of breath, myalgias, claudication  Gout -on 100 mg allopurinol, patient cannot recall his last flare previous alcoholic he is currently drinks alcohol occasionally or socially Last uric acid was reviewed was several years ago was abnormally low   Thiamine deficiency, patient was able to get thiamine supplement but he has recently run out     Current Outpatient Medications:    allopurinol (ZYLOPRIM) 100 MG tablet, Take 1 tablet (100 mg total) by mouth daily., Disp: 90 tablet, Rfl: 3   amLODipine (NORVASC) 10 MG tablet, Take 1 tablet (10 mg total) by mouth daily., Disp: 90 tablet, Rfl: 1   atorvastatin (LIPITOR) 40 MG tablet, Take 1 tablet (40 mg total) by mouth daily., Disp: 90 tablet, Rfl: 3   losartan (COZAAR) 100 MG tablet, TAKE 1 TABLET (100 MG TOTAL)  BY MOUTH DAILY., Disp: 90 tablet, Rfl: 0   magnesium oxide (MAG-OX) 400 MG tablet, One by mouth three days a day Friday (10/11) and Saturday(10/12) and Sunday (10/13), then one a day, Disp: , Rfl:    Omega 3 1000 MG CAPS, Take 3 capsules (3,000 mg total) by mouth daily., Disp: , Rfl:    thiamine (VITAMIN B-1) 100 MG tablet, Take 1 tablet (100 mg total) by mouth daily., Disp: 90 tablet, Rfl: 3  Patient Active Problem List   Diagnosis Date Noted   Hyperlipidemia 04/24/2015   Chronic kidney disease (CKD), stage II (mild) 09/20/2014   Chronic low back pain 09/20/2014   Diverticulosis of colon 09/20/2014   Gout 09/20/2014   Chronic pain of right knee 09/20/2014   Nondependent alcohol abuse, episodic drinking behavior 07/10/2008   Benign essential HTN 06/18/2007    Past Surgical History:  Procedure Laterality Date   COLONOSCOPY WITH PROPOFOL N/A 10/10/2016   Procedure: COLONOSCOPY WITH PROPOFOL;  Surgeon: Jonathon Bellows, MD;  Location: [facility] ENDOSCOPY;  Service: Endoscopy;  Laterality: N/A;   LUMBAR FUSION  10/12/2009   LUMBAR LAMINECTOMY  10/12/2009   REPLACEMENT TOTAL KNEE Right 08/2013    Family History  Problem Relation Age of Onset   Coronary artery disease Mother    Congestive Heart Failure Father    Coronary artery disease Brother    COPD Sister    Breast cancer Sister 43   Heart  attack Cousin    Diabetes Cousin     Social History   Tobacco Use   Smoking status: Former    Types: Cigarettes    Quit date: 09/20/1978    Years since quitting: 42.4   Smokeless tobacco: Never  Vaping Use   Vaping Use: Never used  Substance Use Topics   Alcohol use: Yes    Alcohol/week: 0.0 - 21.0 standard drinks    Comment: " i drink when I want to drink' stated pt   Drug use: No     Allergies  Allergen Reactions   Fluzone [Influenza Virus Vaccine]     Health Maintenance  Topic Date Due   Zoster Vaccines- Shingrix (1 of 2) Never done   COLONOSCOPY (Pts 45-89yrs Insurance coverage  will need to be confirmed)  10/10/2021   TETANUS/TDAP  04/26/2022   Pneumonia Vaccine 29+ Years old  Completed   COVID-19 Vaccine  Completed   Hepatitis C Screening  Completed   HPV VACCINES  Aged Out   INFLUENZA VACCINE  Discontinued    Chart Review Today: I personally reviewed active problem list, medication list, allergies, family history, social history, health maintenance, notes from last encounter, lab results, imaging with the patient/caregiver today.   Review of Systems  Constitutional: Negative.   HENT: Negative.    Eyes: Negative.   Respiratory: Negative.  Negative for chest tightness and shortness of breath.   Cardiovascular: Negative.  Negative for chest pain, palpitations and leg swelling.  Gastrointestinal: Negative.   Endocrine: Negative.   Genitourinary: Negative.   Musculoskeletal: Negative.   Skin: Negative.   Allergic/Immunologic: Negative.   Neurological: Negative.   Hematological: Negative.   Psychiatric/Behavioral: Negative.    All other systems reviewed and are negative.   Objective:   Vitals:   03/05/21 0836  BP: 118/62  Pulse: 99  Resp: 16  Temp: 97.7 F (36.5 C)  TempSrc: Oral  SpO2: 96%  Weight: 209 lb 14.4 oz (95.2 kg)  Height: 6' (1.829 m)    Body mass index is 28.47 kg/m.    Physical Exam Vitals and nursing note reviewed.  Constitutional:      General: He is not in acute distress.    Appearance: Normal appearance. He is well-developed and overweight. He is not ill-appearing, toxic-appearing or diaphoretic.     Interventions: Face mask in place.  HENT:     Head: Normocephalic and atraumatic.     Jaw: No trismus.     Right Ear: External ear normal.     Left Ear: External ear normal.  Eyes:     General: Lids are normal. No scleral icterus.       Right eye: No discharge.        Left eye: No discharge.     Conjunctiva/sclera: Conjunctivae normal.  Neck:     Trachea: Trachea and phonation normal. No tracheal deviation.   Cardiovascular:     Rate and Rhythm: Normal rate and regular rhythm. FrequentExtrasystoles are present.    Chest Wall: PMI is not displaced.     Pulses: Normal pulses.          Radial pulses are 2+ on the right side and 2+ on the left side.     Heart sounds: Normal heart sounds. No murmur heard.   No friction rub. No gallop.     Comments: Bilateral lower extremity edema, nonpitting Pulmonary:     Effort: Pulmonary effort is normal. No respiratory distress.     Breath sounds: Normal  breath sounds. No stridor. No wheezing, rhonchi or rales.  Abdominal:     General: Bowel sounds are normal. There is no distension.     Palpations: Abdomen is soft.  Musculoskeletal:     Right lower leg: Edema present.     Left lower leg: Edema present.  Skin:    General: Skin is warm and dry.     Coloration: Skin is not jaundiced.     Findings: No rash.     Nails: There is no clubbing.  Neurological:     Mental Status: He is alert. Mental status is at baseline.     Cranial Nerves: No dysarthria or facial asymmetry.     Motor: No tremor or abnormal muscle tone.     Gait: Gait abnormal.  Psychiatric:        Mood and Affect: Mood normal.        Speech: Speech normal.        Behavior: Behavior normal. Behavior is cooperative.    ECG interpretation   Date: 03/05/21  Rate: 78  Rhythm: sinus rhythm  QRS Axis: normal  Intervals: normal  ST/T Wave abnormalities: normal -no ST elevation or depression  Conduction Disutrbances: none  Old EKG Reviewed: 12/29/2017 - No significant changes noted      Assessment & Plan:     ICD-10-CM   1. Primary hypertension  99991111 COMPLETE METABOLIC PANEL WITH GFR   Stable, well-controlled on losartan 100 mg and amlodipine 10 mg, good compliance with meds no side effects or concerns no exertional symptoms    2. Pure hypercholesterolemia  [id number] COMPLETE METABOLIC PANEL WITH GFR    Lipid panel   Lipids previously well controlled on statin, good compliance, no  myalgias side effects or concerns, due for recheck    3. Stage 3a chronic kidney disease (HCC)  [id number] COMPLETE METABOLIC PANEL WITH GFR   GFR has been stable, recheck, explained chronic kidney disease to patient encouraged continued blood pressure control    4. Nutritional anemia  D53.9 CBC with Differential/Platelet   Not currently on supplement recheck CBC    5. Gout, unspecified cause, unspecified chronicity, unspecified site  [id number] COMPLETE METABOLIC PANEL WITH GFR    Uric acid   No recent flares, last labs uric acid was abnormal low, not drinking as much alcohol, recheck and see if we can discontinue allopurinol    6. Alcohol abuse  [id number] COMPLETE METABOLIC PANEL WITH GFR   Former, patient drinking occasionally denies daily    7. Irregular heart beat  I49.9 EKG 12-Lead   On exam initially multiple irregular beats, EKG shows normal sinus, likely premature beats, patient asymptomatic no palpitations chest pain near syncope    8. Unsteady gait when walking  R26.81    Onset many years ago previously did PT but could no longer afford he tries to go to the [facility]    9. Medication monitoring encounter  [id number] COMPLETE METABOLIC PANEL WITH GFR    Lipid panel    CBC with Differential/Platelet    Uric acid    10. Family history of cardiac disorder  Z21.49    multiple family members - father, mother, brother and cousins - pt denies any sx, does not want cardiac consult, reviewed concerning sx that warrant f/up        6 month routine f/up OV   Delsa Grana, PA-C 03/05/21 10:08 AM

## 2021-03-06 ENCOUNTER — Other Ambulatory Visit: Payer: Self-pay | Admitting: Family Medicine

## 2021-03-06 LAB — URIC ACID: Uric Acid, Serum: 4.2 mg/dL (ref 4.0–8.0)

## 2021-03-06 LAB — LIPID PANEL
Cholesterol: 106 mg/dL (ref <200)
HDL: 46 mg/dL (ref >=40)
LDL Cholesterol (Calc): 47 mg/dL (calc)
Non-HDL Cholesterol (Calc): 60 mg/dL (calc) (ref <130)
Total CHOL/HDL Ratio: 2.3 (calc) (ref <5.0)
Triglycerides: 53 mg/dL (ref <150)

## 2021-03-06 LAB — COMPLETE METABOLIC PANEL WITH GFR
AG Ratio: 1.4 (calc) (ref 1.0–2.5)
ALT: 31 U/L (ref 9–46)
AST: 35 U/L (ref 10–35)
Albumin: 3.8 g/dL (ref 3.6–5.1)
Alkaline phosphatase (APISO): 65 U/L (ref 35–144)
BUN/Creatinine Ratio: 11 (calc) (ref 6–22)
BUN: 16 mg/dL (ref 7–25)
CO2: 27 mmol/L (ref 20–32)
Calcium: 9.4 mg/dL (ref 8.6–10.3)
Chloride: 105 mmol/L (ref 98–110)
Creat: 1.44 mg/dL — ABNORMAL HIGH (ref 0.70–1.28)
Globulin: 2.8 g/dL (calc) (ref 1.9–3.7)
Glucose, Bld: 95 mg/dL (ref 65–99)
Potassium: 4.6 mmol/L (ref 3.5–5.3)
Sodium: 139 mmol/L (ref 135–146)
Total Bilirubin: 0.6 mg/dL (ref 0.2–1.2)
Total Protein: 6.6 g/dL (ref 6.1–8.1)
eGFR: 51 mL/min/{1.73_m2} — ABNORMAL LOW (ref >=60)

## 2021-03-06 LAB — CBC WITH DIFFERENTIAL/PLATELET
Absolute Monocytes: 593 cells/uL (ref 200–950)
Basophils Absolute: 23 cells/uL (ref 0–200)
Basophils Relative: 0.4 %
Eosinophils Absolute: 80 cells/uL (ref 15–500)
Eosinophils Relative: 1.4 %
HCT: 42.8 % (ref 38.5–50.0)
Hemoglobin: 14.1 g/dL (ref 13.2–17.1)
Lymphs Abs: 1573 cells/uL (ref 850–3900)
MCH: 29.8 pg (ref 27.0–33.0)
MCHC: 32.9 g/dL (ref 32.0–36.0)
MCV: 90.5 fL (ref 80.0–100.0)
MPV: 10.8 fL (ref 7.5–12.5)
Monocytes Relative: 10.4 %
Neutro Abs: 3431 cells/uL (ref 1500–7800)
Neutrophils Relative %: 60.2 %
Platelets: 215 10*3/uL (ref 140–400)
RBC: 4.73 10*6/uL (ref 4.20–5.80)
RDW: 12.8 % (ref 11.0–15.0)
Total Lymphocyte: 27.6 %
WBC: 5.7 10*3/uL (ref 3.8–10.8)

## 2021-03-11 ENCOUNTER — Other Ambulatory Visit: Payer: Self-pay

## 2021-03-11 DIAGNOSIS — N179 Acute kidney failure, unspecified: Secondary | ICD-10-CM

## 2021-03-11 DIAGNOSIS — N1831 Chronic kidney disease, stage 3a: Secondary | ICD-10-CM

## 2021-04-23 DIAGNOSIS — N1831 Chronic kidney disease, stage 3a: Secondary | ICD-10-CM | POA: Diagnosis not present

## 2021-04-23 DIAGNOSIS — N179 Acute kidney failure, unspecified: Secondary | ICD-10-CM | POA: Diagnosis not present

## 2021-04-24 LAB — BASIC METABOLIC PANEL WITH GFR
BUN/Creatinine Ratio: 9 (calc) (ref 6–22)
BUN: 12 mg/dL (ref 7–25)
CO2: 26 mmol/L (ref 20–32)
Calcium: 9.1 mg/dL (ref 8.6–10.3)
Chloride: 103 mmol/L (ref 98–110)
Creat: 1.41 mg/dL — ABNORMAL HIGH (ref 0.70–1.28)
Glucose, Bld: 82 mg/dL (ref 65–99)
Potassium: 4.5 mmol/L (ref 3.5–5.3)
Sodium: 138 mmol/L (ref 135–146)
eGFR: 53 mL/min/{1.73_m2} — ABNORMAL LOW (ref >=60)

## 2021-04-27 ENCOUNTER — Telehealth: Payer: Self-pay | Admitting: Family Medicine

## 2021-04-27 ENCOUNTER — Other Ambulatory Visit: Payer: Self-pay | Admitting: Unknown Physician Specialty

## 2021-04-27 DIAGNOSIS — F1010 Alcohol abuse, uncomplicated: Secondary | ICD-10-CM

## 2021-04-27 DIAGNOSIS — Z5181 Encounter for therapeutic drug level monitoring: Secondary | ICD-10-CM

## 2021-04-27 DIAGNOSIS — I10 Essential (primary) hypertension: Secondary | ICD-10-CM

## 2021-04-27 NOTE — Telephone Encounter (Signed)
dc'd 03/06/21 by Danelle Berry PA pt has not taken in the last 30 days  Requested Prescriptions  Refused Prescriptions Disp Refills   GNP VITAMIN B-1 100 MG tablet [Pharmacy Med Name: GNP VITAMIN B-1 100 MG Tablet] 90 tablet     Sig: TAKE 1 TABLET EVERY DAY FOR NUTRITIONAL DEFICIENCIES DUE TO ALCOHOL     Off-Protocol Failed - 04/27/2021 11:14 AM      Failed - Medication not assigned to a protocol, review manually.      Passed - Valid encounter within last 12 months    Recent Outpatient Visits          1 month ago Primary hypertension   [facility] Danelle Berry, PA-C   7 months ago Unsteady gait when walking   [facility] Gabriel Cirri, NP   1 year ago Hypertension, unspecified type   [facility] Danelle Berry, PA-C   1 year ago Hypertension, unspecified type   [facility] Danelle Berry, PA-C   1 year ago Benign essential HTN   [facility] Danelle Berry, PA-C      Future Appointments            In 3 months  [facility]   In 4 months Danelle Berry, PA-C [facility], [facility]

## 2021-04-27 NOTE — Telephone Encounter (Signed)
Requested Prescriptions  Pending Prescriptions Disp Refills   amLODipine (NORVASC) 10 MG tablet [Pharmacy Med Name: AMLODIPINE BESYLATE 10 MG Tablet] 90 tablet 1    Sig: TAKE 1 TABLET EVERY DAY     Cardiovascular:  Calcium Channel Blockers Passed - 04/27/2021 11:14 AM      Passed - Last BP in normal range    BP Readings from Last 1 Encounters:  03/05/21 118/62         Passed - Valid encounter within last 6 months    Recent Outpatient Visits          1 month ago Primary hypertension   [facility] Danelle Berry, PA-C   7 months ago Unsteady gait when walking   [facility] Gabriel Cirri, NP   1 year ago Hypertension, unspecified type   [facility] Danelle Berry, PA-C   1 year ago Hypertension, unspecified type   [facility] Danelle Berry, PA-C   1 year ago Benign essential HTN   [facility] Danelle Berry, PA-C      Future Appointments            In 3 months  [facility]   In 4 months Danelle Berry, PA-C [facility], [facility]

## 2021-07-10 ENCOUNTER — Other Ambulatory Visit: Payer: Self-pay | Admitting: Family Medicine

## 2021-08-15 ENCOUNTER — Ambulatory Visit: Payer: Medicare HMO

## 2021-08-20 ENCOUNTER — Other Ambulatory Visit: Payer: Self-pay

## 2021-08-20 ENCOUNTER — Ambulatory Visit (INDEPENDENT_AMBULATORY_CARE_PROVIDER_SITE_OTHER): Payer: Medicare HMO

## 2021-08-20 ENCOUNTER — Telehealth: Payer: Self-pay

## 2021-08-20 DIAGNOSIS — Z86010 Personal history of colonic polyps: Secondary | ICD-10-CM

## 2021-08-20 DIAGNOSIS — Z1211 Encounter for screening for malignant neoplasm of colon: Secondary | ICD-10-CM

## 2021-08-20 DIAGNOSIS — Z0000 Encounter for general adult medical examination without abnormal findings: Secondary | ICD-10-CM

## 2021-08-20 MED ORDER — PEG 3350-KCL-NA BICARB-NACL 420 G PO SOLR: 4000.0000 mL | Freq: Once | ORAL | 0 refills | Status: AC

## 2021-08-20 NOTE — Telephone Encounter (Signed)
Gastroenterology Pre-Procedure Review ? ?Request Date: 09/27/21 ?Requesting Physician: Dr. Tobi Bastos ? ?PATIENT REVIEW QUESTIONS: The patient responded to the following health history questions as indicated:   ? ?1. Are you having any GI issues? no ?2. Do you have a personal history of Polyps? yes (last colonoscopy 2018 with Dr. Tobi Bastos) ?3. Do you have a family history of Colon Cancer or Polyps? no ?4. Diabetes Mellitus? no ?5. Joint replacements in the past 12 months?no ?6. Major health problems in the past 3 months?no ?7. Any artificial heart valves, MVP, or defibrillator?no ?   ?MEDICATIONS & ALLERGIES:    ?Patient reports the following regarding taking any anticoagulation/antiplatelet therapy:   ?Plavix, Coumadin, Eliquis, Xarelto, Lovenox, Pradaxa, Brilinta, or Effient? no ?Aspirin? no ? ?Patient confirms/reports the following medications:  ?Current Outpatient Medications  ?Medication Sig Dispense Refill  ? allopurinol (ZYLOPRIM) 100 MG tablet Take 1 tablet (100 mg total) by mouth daily. 90 tablet 3  ? amLODipine (NORVASC) 10 MG tablet TAKE 1 TABLET EVERY DAY 90 tablet 1  ? atorvastatin (LIPITOR) 40 MG tablet Take 1 tablet (40 mg total) by mouth daily. 90 tablet 3  ? losartan (COZAAR) 100 MG tablet TAKE 1 TABLET EVERY DAY 90 tablet 0  ? Omega 3 1000 MG CAPS Take 3 capsules (3,000 mg total) by mouth daily.    ? ?No current facility-administered medications for this visit.  ? ? ?Patient confirms/reports the following allergies:  ?Allergies  ?Allergen Reactions  ? Fluzone [Influenza Virus Vaccine]   ? ? ?No orders of the defined types were placed in this encounter. ? ? ?AUTHORIZATION INFORMATION ?Primary Insurance: ?1D#: ?Group #: ? ?Secondary Insurance: ?1D#: ?Group #: ? ?SCHEDULE INFORMATION: ?Date: 09/27/21 ?Time: ?Location: [facility] ?

## 2021-08-20 NOTE — Patient Instructions (Signed)
Lonnie Huber , ?Thank you for taking time to come for your Medicare Wellness Visit. I appreciate your ongoing commitment to your health goals. Please review the following plan we discussed and let me know if I can assist you in the future.  ? ?Screening recommendations/referrals: ?Colonoscopy: done 10/10/16. Referral sent to [facility] today for repeat screening colonoscopy. They will contact you to schedule an appointment.  ?Recommended yearly ophthalmology/optometry visit for glaucoma screening and checkup ?Recommended yearly dental visit for hygiene and checkup ? ?Vaccinations: ?Influenza vaccine: declined ?Pneumococcal vaccine: done 05/19/16 ?Tdap vaccine: done 04/26/12 ?Shingles vaccine: Shingrix discussed. Please contact your pharmacy for coverage information.  ?Covid-19: done 06/25/19, 03/15/20, 09/05/20 & 03/01/21 ? ?Advanced directives: Please bring a copy of your health care power of attorney and living will to the office at your convenience once you have completed those documents.  ? ?Conditions/risks identified: recommend increasing activity  ? ?Next appointment: Follow up in one year for your annual wellness visit.  ? ?Preventive Care 62 Years and Older, Male ?Preventive care refers to lifestyle choices and visits with your health care provider that can promote health and wellness. ?What does preventive care include? ?A yearly physical exam. This is also called an annual well check. ?Dental exams once or twice a year. ?Routine eye exams. Ask your health care provider how often you should have your eyes checked. ?Personal lifestyle choices, including: ?Daily care of your teeth and gums. ?Regular physical activity. ?Eating a healthy diet. ?Avoiding tobacco and drug use. ?Limiting alcohol use. ?Practicing safe sex. ?Taking low doses of aspirin every day. ?Taking vitamin and mineral supplements as recommended by your health care provider. ?What happens during an annual well check? ?The services and  screenings done by your health care provider during your annual well check will depend on your age, overall health, lifestyle risk factors, and family history of disease. ?Counseling  ?Your health care provider may ask you questions about your: ?Alcohol use. ?Tobacco use. ?Drug use. ?Emotional well-being. ?Home and relationship well-being. ?Sexual activity. ?Eating habits. ?History of falls. ?Memory and ability to understand (cognition). ?Work and work Astronomer. ?Screening  ?You may have the following tests or measurements: ?Height, weight, and BMI. ?Blood pressure. ?Lipid and cholesterol levels. These may be checked every 5 years, or more frequently if you are over 67 years old. ?Skin check. ?Lung cancer screening. You may have this screening every year starting at age 52 if you have a 30-pack-year history of smoking and currently smoke or have quit within the past 15 years. ?Fecal occult blood test (FOBT) of the stool. You may have this test every year starting at age 77. ?Flexible sigmoidoscopy or colonoscopy. You may have a sigmoidoscopy every 5 years or a colonoscopy every 10 years starting at age 88. ?Prostate cancer screening. Recommendations will vary depending on your family history and other risks. ?Hepatitis C blood test. ?Hepatitis B blood test. ?Sexually transmitted disease (STD) testing. ?Diabetes screening. This is done by checking your blood sugar (glucose) after you have not eaten for a while (fasting). You may have this done every 1-3 years. ?Abdominal aortic aneurysm (AAA) screening. You may need this if you are a current or former smoker. ?Osteoporosis. You may be screened starting at age 31 if you are at high risk. ?Talk with your health care provider about your test results, treatment options, and if necessary, the need for more tests. ?Vaccines  ?Your health care provider may recommend certain vaccines, such as: ?Influenza vaccine. This is recommended  every year. ?Tetanus, diphtheria, and  acellular pertussis (Tdap, Td) vaccine. You may need a Td booster every 10 years. ?Zoster vaccine. You may need this after age 74. ?Pneumococcal 13-valent conjugate (PCV13) vaccine. One dose is recommended after age 7. ?Pneumococcal polysaccharide (PPSV23) vaccine. One dose is recommended after age 6. ?Talk to your health care provider about which screenings and vaccines you need and how often you need them. ?This information is not intended to replace advice given to you by your health care provider. Make sure you discuss any questions you have with your health care provider. ?Document Released: 04/27/2015 Document Revised: 12/19/2015 Document Reviewed: 01/30/2015 ?Elsevier Interactive Patient Education ? 2017 [employer] ? ?Fall Prevention in the Home ?Falls can cause injuries. They can happen to people of all ages. There are many things you can do to make your home safe and to help prevent falls. ?What can I do on the outside of my home? ?Regularly fix the edges of walkways and driveways and fix any cracks. ?Remove anything that might make you trip as you walk through a door, such as a raised step or threshold. ?Trim any bushes or trees on the path to your home. ?Use bright outdoor lighting. ?Clear any walking paths of anything that might make someone trip, such as rocks or tools. ?Regularly check to see if handrails are loose or broken. Make sure that both sides of any steps have handrails. ?Any raised decks and porches should have guardrails on the edges. ?Have any leaves, snow, or ice cleared regularly. ?Use sand or salt on walking paths during winter. ?Clean up any spills in your garage right away. This includes oil or grease spills. ?What can I do in the bathroom? ?Use night lights. ?Install grab bars by the toilet and in the tub and shower. Do not use towel bars as grab bars. ?Use non-skid mats or decals in the tub or shower. ?If you need to sit down in the shower, use a plastic, non-slip stool. ?Keep  the floor dry. Clean up any water that spills on the floor as soon as it happens. ?Remove soap buildup in the tub or shower regularly. ?Attach bath mats securely with double-sided non-slip rug tape. ?Do not have throw rugs and other things on the floor that can make you trip. ?What can I do in the bedroom? ?Use night lights. ?Make sure that you have a light by your bed that is easy to reach. ?Do not use any sheets or blankets that are too big for your bed. They should not hang down onto the floor. ?Have a firm chair that has side arms. You can use this for support while you get dressed. ?Do not have throw rugs and other things on the floor that can make you trip. ?What can I do in the kitchen? ?Clean up any spills right away. ?Avoid walking on wet floors. ?Keep items that you use a lot in easy-to-reach places. ?If you need to reach something above you, use a strong step stool that has a grab bar. ?Keep electrical cords out of the way. ?Do not use floor polish or wax that makes floors slippery. If you must use wax, use non-skid floor wax. ?Do not have throw rugs and other things on the floor that can make you trip. ?What can I do with my stairs? ?Do not leave any items on the stairs. ?Make sure that there are handrails on both sides of the stairs and use them. Fix handrails that are broken  or loose. Make sure that handrails are as long as the stairways. ?Check any carpeting to make sure that it is firmly attached to the stairs. Fix any carpet that is loose or worn. ?Avoid having throw rugs at the top or bottom of the stairs. If you do have throw rugs, attach them to the floor with carpet tape. ?Make sure that you have a light switch at the top of the stairs and the bottom of the stairs. If you do not have them, ask someone to add them for you. ?What else can I do to help prevent falls? ?Wear shoes that: ?Do not have high heels. ?Have rubber bottoms. ?Are comfortable and fit you well. ?Are closed at the toe. Do not  wear sandals. ?If you use a stepladder: ?Make sure that it is fully opened. Do not climb a closed stepladder. ?Make sure that both sides of the stepladder are locked into place. ?Ask someone to hold it for

## 2021-08-20 NOTE — Progress Notes (Signed)
? ?Subjective:  ? Lonnie Huber is a 76 y.o. male who presents for Medicare Annual/Subsequent preventive examination. ? ?Virtual Visit via Telephone Note ? ?I connected with  Lonnie Huber on 08/20/21 at  3:00 PM EDT by telephone and verified that I am speaking with the correct person using two identifiers. ? ?Location: ?Patient: home ?Provider: [facility] ?Persons participating in the virtual visit: patient/Nurse Health Advisor ?  ?I discussed the limitations, risks, security and privacy concerns of performing an evaluation and management service by telephone and the availability of in person appointments. The patient expressed understanding and agreed to proceed. ? ?Interactive audio and video telecommunications were attempted between this nurse and patient, however failed, due to patient having technical difficulties OR patient did not have access to video capability.  We continued and completed visit with audio only. ? ?Some vital signs may be absent or patient reported.  ? ?Reather LittlerKasey Julius Matus, LPN ? ? ?Review of Systems    ? ?  ? ?   ?Objective:  ?  ?There were no vitals filed for this visit. ?There is no height or weight on file to calculate BMI. ? ? ?  08/20/2021  ?  3:21 PM 08/14/2020  ?  8:33 AM 07/28/2019  ?  2:30 PM 02/09/2018  ?  8:15 AM 01/27/2018  ? 10:39 AM 01/06/2018  ?  1:50 PM 10/10/2016  ?  8:05 AM  ?Advanced Directives  ?Does Patient Have a Medical Advance Directive? No No No  No No No  ?Would patient like information on creating a medical advance directive? No - Patient declined No - Patient declined No - Patient declined No - Patient declined No - Patient declined No - Patient declined No - Patient declined  ? ? ?Current Medications (verified) ?Outpatient Encounter Medications as of 08/20/2021  ?Medication Sig  ? allopurinol (ZYLOPRIM) 100 MG tablet Take 1 tablet (100 mg total) by mouth daily.  ? amLODipine (NORVASC) 10 MG tablet TAKE 1 TABLET EVERY DAY  ? atorvastatin (LIPITOR) 40 MG tablet Take 1 tablet (40  mg total) by mouth daily.  ? losartan (COZAAR) 100 MG tablet TAKE 1 TABLET EVERY DAY  ? Omega 3 1000 MG CAPS Take 3 capsules (3,000 mg total) by mouth daily.  ? ?No facility-administered encounter medications on file as of 08/20/2021.  ? ? ?Allergies (verified) ?Fluzone [influenza virus vaccine]  ? ?History: ?Past Medical History:  ?Diagnosis Date  ? Alcohol abuse 09/20/2014  ? Decreased libido 10/20/2007  ? Failure of erection 09/20/2014  ? Gout   ? Hyperlipidemia   ? Hypertension   ? Thyroid disease   ? ?Past Surgical History:  ?Procedure Laterality Date  ? COLONOSCOPY WITH PROPOFOL N/A 10/10/2016  ? Procedure: COLONOSCOPY WITH PROPOFOL;  Surgeon: Wyline MoodAnna, Kiran, MD;  [facility];  Service: Endoscopy;  Laterality: N/A;  ? LUMBAR FUSION  10/12/2009  ? LUMBAR LAMINECTOMY  10/12/2009  ? REPLACEMENT TOTAL KNEE Right 08/2013  ? ?Family History  ?Problem Relation Age of Onset  ? Coronary artery disease Mother   ? Congestive Heart Failure Father   ? Coronary artery disease Brother   ? COPD Sister   ? Breast cancer Sister [id number]  ? Heart attack Cousin   ? Diabetes Cousin   ? ?Social History  ? ?Socioeconomic History  ? Marital status: Married  ?  Spouse name: Not on file  ? Number of children: Not on file  ? Years of education: Not on file  ? Highest education level:  Not on file  ?Occupational History  ? Occupation: retired  ?Tobacco Use  ? Smoking status: Former  ?  Types: Cigarettes  ?  Quit date: 09/20/1978  ?  Years since quitting: 42.9  ? Smokeless tobacco: Never  ?Vaping Use  ? Vaping Use: Never used  ?Substance and Sexual Activity  ? Alcohol use: Yes  ?  Alcohol/week: 0.0 - 21.0 standard drinks  ?  Comment: " i drink when I want to drink' stated pt  ? Drug use: No  ? Sexual activity: Yes  ?Other Topics Concern  ? Not on file  ?Social History Narrative  ? Not on file  ? ?Social Determinants of Health  ? ?Financial Resource Strain: High Risk  ? Difficulty of Paying Living Expenses: Hard  ?Food Insecurity: Food Insecurity  Present  ? Worried About Programme researcher, broadcasting/film/video in the Last Year: Sometimes true  ? Ran Out of Food in the Last Year: Never true  ?Transportation Needs: No Transportation Needs  ? Lack of Transportation (Medical): No  ? Lack of Transportation (Non-Medical): No  ?Physical Activity: Inactive  ? Days of Exercise per Week: 0 days  ? Minutes of Exercise per Session: 0 min  ?Stress: No Stress Concern Present  ? Feeling of Stress : Not at all  ?Social Connections: Moderately Isolated  ? Frequency of Communication with Friends and Family: More than three times a week  ? Frequency of Social Gatherings with Friends and Family: Three times a week  ? Attends Religious Services: Never  ? Active Member of Clubs or Organizations: No  ? Attends Banker Meetings: Never  ? Marital Status: Married  ? ? ?Tobacco Counseling ?Counseling given: Not Answered ? ? ?Clinical Intake: ? ?  ? ?  ? ?  ? ?  ? ?  ? ? ? ?  ? ?  ? ? ?Activities of Daily Living ? ?  03/05/2021  ?  8:27 AM 09/04/2020  ?  7:55 AM  ?In your present state of health, do you have any difficulty performing the following activities:  ?Hearing? 0 0  ?Vision? 0 0  ?Difficulty concentrating or making decisions? 0 0  ?Walking or climbing stairs? 0 1  ?Dressing or bathing? 0 0  ?Doing errands, shopping? 0 0  ? ? ?Patient Care Team: ?Danelle Berry, PA-C as PCP - General (Family Medicine) ?Morene Crocker, MD as Referring Physician (Neurology) ?Andee Poles, RPH (Inactive) (Pharmacist) ? ?Indicate any recent Medical Services you may have received from other than [facility] providers in the past year (date may be approximate). ? ?   ?Assessment:  ? This is a routine wellness examination for Anna. ? ?Hearing/Vision screen ?Hearing Screening - Comments:: Pt denies hearing difficulty ?Vision Screening - Comments:: Annual vision screenings done by [facility] ? ?Dietary issues and exercise activities discussed: ?  ? ? Goals Addressed   ?None ?  ? ?Depression Screen ? ?   08/20/2021  ?  3:14 PM 03/05/2021  ?  8:27 AM 09/04/2020  ?  7:56 AM 08/14/2020  ?  8:25 AM 03/22/2020  ?  9:47 AM 02/27/2020  ?  8:25 AM 08/26/2019  ?  8:05 AM  ?PHQ 2/9 Scores  ?PHQ - 2 Score 0 0 0 2 0 1 1  ?PHQ- 9 Score  0  4   1  ?  ?Fall Risk ? ?  08/20/2021  ?  3:21 PM 03/05/2021  ?  8:27 AM 09/04/2020  ?  7:55 AM 08/14/2020  ?  8:34 AM 03/22/2020  ?  9:47 AM  ?Fall Risk   ?Falls in the past year? 0 0 0 0 0  ?Number falls in past yr: 0 0 0 0 0  ?Injury with Fall? 0 0 0 0 0  ?Risk for fall due to : Impaired balance/gait No Fall Risks  Impaired balance/gait   ?Follow up Falls prevention discussed Falls prevention discussed Falls evaluation completed Falls prevention discussed Falls evaluation completed  ? ? ?FALL RISK PREVENTION PERTAINING TO THE HOME: ? ?Any stairs in or around the home? Yes  ?If so, are there any without handrails? No  ?Home free of loose throw rugs in walkways, pet beds, electrical cords, etc? Yes  ?Adequate lighting in your home to reduce risk of falls? Yes  ? ?ASSISTIVE DEVICES UTILIZED TO PREVENT FALLS: ? ?Life alert? No  ?Use of a cane, walker or w/c? No  ?Grab bars in the bathroom? No  ?Shower chair or bench in shower? No  ?Elevated toilet seat or a handicapped toilet? No  ? ?TIMED UP AND GO: ? ?Was the test performed? No .  ? ?Cognitive Function: Normal cognitive status assessed by direct observation by this Nurse Health Advisor. No abnormalities found.  ? ?  ?  ? ?  08/14/2020  ?  8:38 AM 07/28/2019  ?  2:40 PM 05/19/2016  ?  1:32 PM  ?6CIT Screen  ?What Year? 0 points 0 points 0 points  ?What month? 0 points 0 points 0 points  ?What time? 0 points 0 points 0 points  ?Count back from 20 0 points 0 points 0 points  ?Months in reverse 4 points 4 points 4 points  ?Repeat phrase 2 points 0 points 8 points  ?Total Score 6 points 4 points 12 points  ? ? ?Immunizations ?Immunization History  ?Administered Date(s) Administered  ? Janssen (J&J) SARS-COV-2 Vaccination 06/25/2019, 03/15/2020  ? Moderna Covid-19  Vaccine Bivalent Booster 81yrs & up 09/05/2020, 03/01/2021  ? Pneumococcal Conjugate-13 05/19/2016  ? Pneumococcal Polysaccharide-23 12/21/2014  ? Tdap 04/26/2012  ? Zoster, Live 04/26/2012  ? ? ?TDAP

## 2021-08-22 ENCOUNTER — Ambulatory Visit: Payer: Medicare HMO

## 2021-08-23 ENCOUNTER — Other Ambulatory Visit: Payer: Self-pay | Admitting: Family Medicine

## 2021-09-03 ENCOUNTER — Ambulatory Visit (INDEPENDENT_AMBULATORY_CARE_PROVIDER_SITE_OTHER): Payer: Medicare HMO | Admitting: Family Medicine

## 2021-09-03 ENCOUNTER — Encounter: Payer: Self-pay | Admitting: Family Medicine

## 2021-09-03 VITALS — BP 116/70 | HR 97 | Temp 97.7°F | Resp 18 | Ht 72.0 in | Wt 199.6 lb

## 2021-09-03 DIAGNOSIS — N182 Chronic kidney disease, stage 2 (mild): Secondary | ICD-10-CM

## 2021-09-03 DIAGNOSIS — M109 Gout, unspecified: Secondary | ICD-10-CM

## 2021-09-03 DIAGNOSIS — Z5181 Encounter for therapeutic drug level monitoring: Secondary | ICD-10-CM | POA: Diagnosis not present

## 2021-09-03 DIAGNOSIS — I10 Essential (primary) hypertension: Secondary | ICD-10-CM

## 2021-09-03 DIAGNOSIS — F1010 Alcohol abuse, uncomplicated: Secondary | ICD-10-CM

## 2021-09-03 DIAGNOSIS — Z1211 Encounter for screening for malignant neoplasm of colon: Secondary | ICD-10-CM

## 2021-09-03 DIAGNOSIS — E7800 Pure hypercholesterolemia, unspecified: Secondary | ICD-10-CM

## 2021-09-03 MED ORDER — LOSARTAN POTASSIUM 100 MG PO TABS: 100.0000 mg | ORAL_TABLET | Freq: Every day | ORAL | 3 refills | Status: DC

## 2021-09-03 MED ORDER — ALLOPURINOL 100 MG PO TABS: 100.0000 mg | ORAL_TABLET | Freq: Every day | ORAL | 3 refills | Status: DC

## 2021-09-03 MED ORDER — ATORVASTATIN CALCIUM 40 MG PO TABS: 40.0000 mg | ORAL_TABLET | Freq: Every day | ORAL | 3 refills | Status: DC

## 2021-09-03 NOTE — Assessment & Plan Note (Addendum)
Losartan for renal protection Monitoring renal function - last GFR was stage 3a - recheck Avoid NSAIDS

## 2021-09-03 NOTE — Assessment & Plan Note (Signed)
Uric acid well controlled, no recent flares Continue allopurinol 100 mg daily and avoid purine rich foods

## 2021-09-03 NOTE — Assessment & Plan Note (Signed)
Compliant with meds, no SE, no myalgias, fatigue or jaundice Lipids done 6 months ago and at goal - will do annually Continue statin

## 2021-09-03 NOTE — Progress Notes (Signed)
Name: Lonnie Huber   MRN: [id number]    DOB: 04/13/1946   Date:09/03/2021       Progress Note  Chief Complaint  Patient presents with   Follow-up     Subjective:   Lonnie Huber is a 76 y.o. male, presents to clinic for routine f/up and med refill  Hypertension:  Currently managed on losartan 100 mg and norvastc  Pt reports good med compliance and denies any SE.   Blood pressure today is well controlled. BP Readings from Last 3 Encounters:  09/03/21 116/70  03/05/21 118/62  09/04/20 122/70   Pt denies CP, SOB, exertional sx, LE edema, palpitation, Ha's, visual disturbances, lightheadedness, hypotension, syncope. Dietary efforts for BP?  none   Hyperlipidemia: Currently treated with lipitor 20, pt reports good med compliance Last Lipids: Lab Results  Component Value Date   CHOL 106 03/05/2021   HDL 46 03/05/2021   LDLCALC 47 03/05/2021   TRIG 53 03/05/2021   CHOLHDL 2.3 03/05/2021   - Denies: Chest pain, shortness of breath, myalgias, claudication  GOUT on allopurinol 100 mg  Uric acid low  Tries to avoid food triggers, no recent flares Lab Results  Component Value Date   LABURIC 4.2 03/05/2021    ETOH "more than I should"   Current Outpatient Medications:    allopurinol (ZYLOPRIM) 100 MG tablet, Take 1 tablet (100 mg total) by mouth daily., Disp: 90 tablet, Rfl: 3   amLODipine (NORVASC) 10 MG tablet, TAKE 1 TABLET EVERY DAY, Disp: 90 tablet, Rfl: 1   atorvastatin (LIPITOR) 40 MG tablet, Take 1 tablet (40 mg total) by mouth daily., Disp: 90 tablet, Rfl: 3   losartan (COZAAR) 100 MG tablet, TAKE 1 TABLET EVERY DAY, Disp: 90 tablet, Rfl: 0   Omega 3 1000 MG CAPS, Take 3 capsules (3,000 mg total) by mouth daily., Disp: , Rfl:   Patient Active Problem List   Diagnosis Date Noted   Hyperlipidemia 04/24/2015   Chronic kidney disease (CKD), stage II (mild) 09/20/2014   Chronic low back pain 09/20/2014   Diverticulosis of colon 09/20/2014   Gout  09/20/2014   Chronic pain of right knee 09/20/2014   Nondependent alcohol abuse, episodic drinking behavior 07/10/2008   Benign essential HTN 06/18/2007    Past Surgical History:  Procedure Laterality Date   COLONOSCOPY WITH PROPOFOL N/A 10/10/2016   Procedure: COLONOSCOPY WITH PROPOFOL;  Surgeon: Jonathon Bellows, MD;  Location[facility] ENDOSCOPY;  Service: Endoscopy;  Laterality: N/A;   LUMBAR FUSION  10/12/2009   LUMBAR LAMINECTOMY  10/12/2009   REPLACEMENT TOTAL KNEE Right 08/2013    Family History  Problem Relation Age of Onset   Coronary artery disease Mother    Congestive Heart Failure Father    Coronary artery disease Brother    COPD Sister    Breast cancer Sister 21   Heart attack Cousin    Diabetes Cousin     Social History   Tobacco Use   Smoking status: Former    Types: Cigarettes    Quit date: 09/20/1978    Years since quitting: 42.9   Smokeless tobacco: Never  Vaping Use   Vaping Use: Never used  Substance Use Topics   Alcohol use: Yes    Alcohol/week: 0.0 - 21.0 standard drinks    Comment: " i drink when I want to drink' stated pt   Drug use: No     Allergies  Allergen Reactions   Fluzone [Influenza Virus Vaccine]  Health Maintenance  Topic Date Due   Zoster Vaccines- Shingrix (1 of 2) 12/04/2021 (Originally 05/15/1997)   COLONOSCOPY (Pts 45-73yrs Insurance coverage will need to be confirmed)  10/10/2021   TETANUS/TDAP  04/26/2022   Pneumonia Vaccine 19+ Years old  Completed   COVID-19 Vaccine  Completed   Hepatitis C Screening  Completed   HPV VACCINES  Aged Out   INFLUENZA VACCINE  Discontinued    Chart Review Today: I personally reviewed active problem list, medication list, allergies, family history, social history, health maintenance, notes from last encounter, lab results, imaging with the patient/caregiver today.   Review of Systems  Constitutional: Negative.   HENT: Negative.    Eyes: Negative.   Respiratory: Negative.     Cardiovascular: Negative.   Gastrointestinal: Negative.   Endocrine: Negative.   Genitourinary: Negative.   Musculoskeletal: Negative.   Skin: Negative.   Allergic/Immunologic: Negative.   Neurological: Negative.   Hematological: Negative.   Psychiatric/Behavioral: Negative.    All other systems reviewed and are negative.   Objective:   Vitals:   09/03/21 0937  BP: 116/70  Pulse: 97  Resp: 18  Temp: 97.7 F (36.5 C)  TempSrc: Oral  SpO2: 98%  Weight: 199 lb 9.6 oz (90.5 kg)  Height: 6' (1.829 m)    Body mass index is 27.07 kg/m.  Physical Exam Vitals and nursing note reviewed.  Constitutional:      General: He is not in acute distress.    Appearance: Normal appearance. He is well-developed, well-groomed and overweight. He is not ill-appearing, toxic-appearing or diaphoretic.  HENT:     Head: Normocephalic and atraumatic.     Right Ear: External ear normal.     Left Ear: External ear normal.  Eyes:     General: No scleral icterus.       Right eye: No discharge.        Left eye: No discharge.     Pupils: Pupils are equal, round, and reactive to light.     Comments: Bilateral conjunctival injection  Cardiovascular:     Rate and Rhythm: Normal rate and regular rhythm.     Pulses: Normal pulses.     Heart sounds: Normal heart sounds. No murmur heard.   No gallop.  Pulmonary:     Effort: Pulmonary effort is normal. No respiratory distress.     Breath sounds: Normal breath sounds. No stridor. No wheezing, rhonchi or rales.  Abdominal:     General: Bowel sounds are normal.     Palpations: Abdomen is soft.     Comments: Obese/protuberant abd  Musculoskeletal:        General: Swelling (right knee) present.     Cervical back: Normal range of motion and neck supple.     Right lower leg: Edema (nonpitting (BL)) present.     Left lower leg: Edema present.  Skin:    General: Skin is warm.     Capillary Refill: Capillary refill takes less than 2 seconds.      Coloration: Skin is not jaundiced or pale.  Neurological:     Mental Status: He is alert. Mental status is at baseline.     Gait: Gait normal.  Psychiatric:        Mood and Affect: Mood normal.        Behavior: Behavior normal. Behavior is cooperative.        Assessment & Plan:   Problem List Items Addressed This Visit       Cardiovascular and  Mediastinum   Benign essential HTN - Primary (Chronic)    BP Readings from Last 3 Encounters:  09/03/21 116/70  03/05/21 118/62  09/04/20 122/70  On norvasc and losartan, BP at goal today      Relevant Medications   atorvastatin (LIPITOR) 40 MG tablet   losartan (COZAAR) 100 MG tablet   Other Relevant Orders   COMPLETE METABOLIC PANEL WITH GFR     Genitourinary   Chronic kidney disease (CKD), stage II (mild)    Losartan for renal protection Monitoring renal function - last GFR was stage 3a - recheck Avoid NSAIDS       Relevant Orders   COMPLETE METABOLIC PANEL WITH GFR     Other   Hyperlipidemia (Chronic)    Compliant with meds, no SE, no myalgias, fatigue or jaundice Lipids done 6 months ago and at goal - will do annually Continue statin       Relevant Medications   atorvastatin (LIPITOR) 40 MG tablet   losartan (COZAAR) 100 MG tablet   Other Relevant Orders   COMPLETE METABOLIC PANEL WITH GFR   Nondependent alcohol abuse, episodic drinking behavior    Increased ETOH use with recent deaths in family, reviewed ETOH affects, discussed moderate use/amount/volume       Relevant Orders   COMPLETE METABOLIC PANEL WITH GFR   Gout    Uric acid well controlled, no recent flares Continue allopurinol 100 mg daily and avoid purine rich foods       Relevant Medications   allopurinol (ZYLOPRIM) 100 MG tablet   Other Relevant Orders   COMPLETE METABOLIC PANEL WITH GFR   Other Visit Diagnoses     Colon cancer screening       Medication monitoring encounter       Relevant Medications   allopurinol (ZYLOPRIM) 100 MG  tablet   atorvastatin (LIPITOR) 40 MG tablet   losartan (COZAAR) 100 MG tablet   Other Relevant Orders   COMPLETE METABOLIC PANEL WITH GFR        No follow-ups on file.   Delsa Grana, PA-C 09/03/21 9:45 AM

## 2021-09-03 NOTE — Assessment & Plan Note (Signed)
Increased ETOH use with recent deaths in family, reviewed ETOH affects, discussed moderate use/amount/volume

## 2021-09-03 NOTE — Assessment & Plan Note (Signed)
BP Readings from Last 3 Encounters:  09/03/21 116/70  03/05/21 118/62  09/04/20 122/70   On norvasc and losartan, BP at goal today

## 2021-09-04 LAB — COMPLETE METABOLIC PANEL WITH GFR
AG Ratio: 1.4 (calc) (ref 1.0–2.5)
ALT: 46 U/L (ref 9–46)
AST: 44 U/L — ABNORMAL HIGH (ref 10–35)
Albumin: 4 g/dL (ref 3.6–5.1)
Alkaline phosphatase (APISO): 67 U/L (ref 35–144)
BUN/Creatinine Ratio: 7 (calc) (ref 6–22)
BUN: 9 mg/dL (ref 7–25)
CO2: 27 mmol/L (ref 20–32)
Calcium: 9.7 mg/dL (ref 8.6–10.3)
Chloride: 101 mmol/L (ref 98–110)
Creat: 1.29 mg/dL — ABNORMAL HIGH (ref 0.70–1.28)
Globulin: 2.8 g/dL (calc) (ref 1.9–3.7)
Glucose, Bld: 111 mg/dL — ABNORMAL HIGH (ref 65–99)
Potassium: 3.9 mmol/L (ref 3.5–5.3)
Sodium: 138 mmol/L (ref 135–146)
Total Bilirubin: 0.9 mg/dL (ref 0.2–1.2)
Total Protein: 6.8 g/dL (ref 6.1–8.1)
eGFR: 58 mL/min/{1.73_m2} — ABNORMAL LOW (ref >=60)

## 2021-09-13 ENCOUNTER — Other Ambulatory Visit: Payer: Self-pay | Admitting: Unknown Physician Specialty

## 2021-09-13 DIAGNOSIS — E7800 Pure hypercholesterolemia, unspecified: Secondary | ICD-10-CM

## 2021-09-13 DIAGNOSIS — Z5181 Encounter for therapeutic drug level monitoring: Secondary | ICD-10-CM

## 2021-09-13 NOTE — Telephone Encounter (Signed)
Patient requesting medication to be sent to [facility] pharmacy instead of [facility] that was sent on 09/03/21. Requested Prescriptions  Pending Prescriptions Disp Refills  . atorvastatin (LIPITOR) 40 MG tablet [Pharmacy Med Name: ATORVASTATIN CALCIUM 40 MG Tablet] 90 tablet 3    Sig: TAKE 1 TABLET EVERY DAY     Cardiovascular:  Antilipid - Statins Failed - 09/13/2021  2:04 PM      Failed - Lipid Panel in normal range within the last 12 months    Cholesterol, Total  Date Value Ref Range Status  04/24/2015 182 100 - 199 mg/dL Final   Cholesterol  Date Value Ref Range Status  03/05/2021 106 <200 mg/dL Final   LDL Cholesterol (Calc)  Date Value Ref Range Status  03/05/2021 47 mg/dL (calc) Final    Comment:    Reference range: <100 . Desirable range <100 mg/dL for primary prevention;   <70 mg/dL for patients with CHD or diabetic patients  with > or = 2 CHD risk factors. Marland Kitchen LDL-C is now calculated using the Martin-Hopkins  calculation, which is a validated novel method providing  better accuracy than the Friedewald equation in the  estimation of LDL-C.  Horald Pollen et al. Lenox Ahr. 1093;235(57): 2061-2068  (http://education.[employer].com/faq/FAQ164)    HDL  Date Value Ref Range Status  03/05/2021 46 > OR = 40 mg/dL Final  32/20/2542 58 >70 mg/dL Final   Triglycerides  Date Value Ref Range Status  03/05/2021 53 <150 mg/dL Final         Passed - Patient is not pregnant      Passed - Valid encounter within last 12 months    Recent Outpatient Visits          1 week ago Benign essential HTN   [facility] Danelle Berry, PA-C   6 months ago Primary hypertension   [facility] Danelle Berry, PA-C   1 year ago Unsteady gait when walking   [facility] Gabriel Cirri, NP   1 year ago Hypertension, unspecified type   [facility] Danelle Berry, PA-C   1 year ago Hypertension, unspecified type   [facility] Danelle Berry, PA-C      Future Appointments            In 5 months Danelle Berry, PA-C [place]

## 2021-09-13 NOTE — Telephone Encounter (Signed)
Called patient to clarify which pharmacy medication to be refilled. Previous request on 09/03/21 [facility]. Patient has not picked up medication and requesting refill to be sent to [facility] mail delivery.

## 2021-09-23 ENCOUNTER — Telehealth: Payer: Self-pay | Admitting: Unknown Physician Specialty

## 2021-09-23 DIAGNOSIS — M109 Gout, unspecified: Secondary | ICD-10-CM

## 2021-09-23 DIAGNOSIS — Z5181 Encounter for therapeutic drug level monitoring: Secondary | ICD-10-CM

## 2021-09-24 NOTE — Telephone Encounter (Signed)
Refilled 09/03/2021 #[id number] 3 rf. Requested Prescriptions  Pending Prescriptions Disp Refills  . allopurinol (ZYLOPRIM) 100 MG tablet [Pharmacy Med Name: ALLOPURINOL 100 MG Tablet] 90 tablet 3    Sig: TAKE 1 TABLET EVERY DAY     Endocrinology:  Gout Agents - allopurinol Failed - 09/23/2021 12:22 PM      Failed - Cr in normal range and within 360 days    Creat  Date Value Ref Range Status  09/03/2021 1.29 (H) 0.70 - 1.28 mg/dL Final         Passed - Uric Acid in normal range and within 360 days    Uric Acid, Serum  Date Value Ref Range Status  03/05/2021 4.2 4.0 - 8.0 mg/dL Final    Comment:    Therapeutic target for gout patients: <6.0 mg/dL .    Uric Acid  Date Value Ref Range Status  04/24/2015 3.1 (L) 3.7 - 8.6 mg/dL Final    Comment:               Therapeutic target for gout patients: <6.0         Passed - Valid encounter within last 12 months    Recent Outpatient Visits          3 weeks ago Benign essential HTN   [facility] Danelle Berry, PA-C   6 months ago Primary hypertension   [facility] Danelle Berry, PA-C   1 year ago Unsteady gait when walking   [facility] Gabriel Cirri, NP   1 year ago Hypertension, unspecified type   [facility] Danelle Berry, PA-C   1 year ago Hypertension, unspecified type   [facility] Danelle Berry, PA-C      Future Appointments            In 5 months Danelle Berry, PA-C [facility]           Passed - CBC within normal limits and completed in the last 12 months    WBC  Date Value Ref Range Status  03/05/2021 5.7 3.8 - 10.8 Thousand/uL Final   RBC  Date Value Ref Range Status  03/05/2021 4.73 4.20 - 5.80 Million/uL Final   Hemoglobin  Date Value Ref Range Status  03/05/2021 14.1 13.2 - 17.1 g/dL Final   HGB  Date Value Ref Range Status  08/31/2013 9.4 (L) 13.0 - 18.0 g/dL Final   HCT  Date Value  Ref Range Status  03/05/2021 42.8 38.5 - 50.0 % Final  08/31/2013 28.0 (L) 40.0 - 52.0 % Final   MCHC  Date Value Ref Range Status  03/05/2021 32.9 32.0 - 36.0 g/dL Final   [facility]  Date Value Ref Range Status  03/05/2021 29.8 27.0 - 33.0 pg Final   MCV  Date Value Ref Range Status  03/05/2021 90.5 80.0 - 100.0 fL Final  08/31/2013 93 80 - 100 fL Final   No results found for: "PLTCOUNTKUC", "LABPLAT", "POCPLA" RDW  Date Value Ref Range Status  03/05/2021 12.8 11.0 - 15.0 % Final  08/31/2013 15.2 (H) 11.5 - 14.5 % Final

## 2021-09-27 ENCOUNTER — Ambulatory Visit: Payer: Medicare HMO | Admitting: Certified Registered"

## 2021-09-27 ENCOUNTER — Encounter
Admission: RE | Disposition: A | Discharge status: Home / Self Care | Payer: Self-pay | Source: Ambulatory Visit | Attending: Gastroenterology

## 2021-09-27 ENCOUNTER — Ambulatory Visit
Admission: RE | Admit: 2021-09-27 | Discharge: 2021-09-27 | Disposition: A | Discharge status: Home / Self Care | Payer: Medicare HMO | Source: Ambulatory Visit | Attending: Gastroenterology | Admitting: Gastroenterology

## 2021-09-27 DIAGNOSIS — Z96651 Presence of right artificial knee joint: Secondary | ICD-10-CM | POA: Insufficient documentation

## 2021-09-27 DIAGNOSIS — Z86010 Personal history of colonic polyps: Secondary | ICD-10-CM | POA: Diagnosis not present

## 2021-09-27 DIAGNOSIS — K5730 Diverticulosis of large intestine without perforation or abscess without bleeding: Secondary | ICD-10-CM | POA: Insufficient documentation

## 2021-09-27 DIAGNOSIS — Z87891 Personal history of nicotine dependence: Secondary | ICD-10-CM | POA: Insufficient documentation

## 2021-09-27 DIAGNOSIS — M109 Gout, unspecified: Secondary | ICD-10-CM | POA: Insufficient documentation

## 2021-09-27 DIAGNOSIS — Z1211 Encounter for screening for malignant neoplasm of colon: Secondary | ICD-10-CM | POA: Diagnosis not present

## 2021-09-27 DIAGNOSIS — E785 Hyperlipidemia, unspecified: Secondary | ICD-10-CM | POA: Insufficient documentation

## 2021-09-27 DIAGNOSIS — I10 Essential (primary) hypertension: Secondary | ICD-10-CM | POA: Insufficient documentation

## 2021-09-27 HISTORY — PX: COLONOSCOPY WITH PROPOFOL: SHX5780

## 2021-09-27 SURGERY — COLONOSCOPY WITH PROPOFOL
Anesthesia: General

## 2021-09-27 MED ORDER — LIDOCAINE HCL (CARDIAC) PF 100 MG/5ML IV SOSY
PREFILLED_SYRINGE | INTRAVENOUS | Status: DC | PRN
  Administered 2021-09-27: 50 mg via INTRAVENOUS

## 2021-09-27 MED ORDER — SODIUM CHLORIDE 0.9 % IV SOLN: INTRAVENOUS | Status: DC

## 2021-09-27 MED ORDER — PROPOFOL 10 MG/ML IV BOLUS
INTRAVENOUS | Status: DC | PRN
  Administered 2021-09-27: 50 mg via INTRAVENOUS

## 2021-09-27 MED ORDER — PROPOFOL 500 MG/50ML IV EMUL
INTRAVENOUS | Status: DC | PRN
  Administered 2021-09-27: 150 ug/kg/min via INTRAVENOUS

## 2021-09-27 MED ORDER — PROPOFOL 1000 MG/100ML IV EMUL
INTRAVENOUS | Status: AC
  Filled 2021-09-27: qty 100

## 2021-09-27 NOTE — Anesthesia Postprocedure Evaluation (Signed)
Anesthesia Post Note  Patient: Celso L Ignasiak  Procedure(s) Performed: COLONOSCOPY WITH PROPOFOL  Patient location during evaluation: Endoscopy Anesthesia Type: General Level of consciousness: awake and alert Pain management: pain level controlled Vital Signs Assessment: post-procedure vital signs reviewed and stable Respiratory status: spontaneous breathing, nonlabored ventilation, respiratory function stable and patient connected to nasal cannula oxygen Cardiovascular status: blood pressure returned to baseline and stable Postop Assessment: no apparent nausea or vomiting Anesthetic complications: no   No notable events documented.   Last Vitals:  Vitals:   09/27/21 0920 09/27/21 0928  BP: 115/76 118/70  Pulse:    Resp:    Temp: (!) 35.6 C   SpO2:      Last Pain:  Vitals:   09/27/21 0928  TempSrc:   PainSc: 0-No pain                 Cleda Mccreedy Kaylen Motl

## 2021-09-27 NOTE — Anesthesia Procedure Notes (Signed)
Procedure Name: MAC Date/Time: 09/27/2021 8:31 AM  Performed by: Jerrye Noble, CRNAPre-anesthesia Checklist: Patient identified, Emergency Drugs available, Suction available and Patient being monitored Patient Re-evaluated:Patient Re-evaluated prior to induction Oxygen Delivery Method: Nasal cannula

## 2021-09-27 NOTE — Anesthesia Preprocedure Evaluation (Signed)
Anesthesia Evaluation  Patient identified by MRN, date of birth, ID band Patient awake    Reviewed: Allergy & Precautions, NPO status , Patient's Chart, lab work & pertinent test results  History of Anesthesia Complications Negative for: history of anesthetic complications  Airway Mallampati: III  TM Distance: >3 FB Neck ROM: full    Dental  (+) Missing   Pulmonary neg shortness of breath, former smoker,    Pulmonary exam normal        Cardiovascular Exercise Tolerance: Good hypertension, (-) anginaNormal cardiovascular exam     Neuro/Psych negative neurological ROS  negative psych ROS   GI/Hepatic negative GI ROS, Neg liver ROS, neg GERD  ,  Endo/Other  negative endocrine ROS  Renal/GU CRFRenal disease  negative genitourinary   Musculoskeletal   Abdominal   Peds  Hematology negative hematology ROS (+)   Anesthesia Other Findings Past Medical History: 09/20/2014: Alcohol abuse 10/20/2007: Decreased libido 09/20/2014: Failure of erection No date: Gout No date: Hyperlipidemia No date: Hypertension No date: Thyroid disease  Past Surgical History: 10/10/2016: COLONOSCOPY WITH PROPOFOL; N/A     Comment:  Procedure: COLONOSCOPY WITH PROPOFOL;  Surgeon: Wyline Mood, MD;  Location: [facility] ENDOSCOPY;  Service:               Endoscopy;  Laterality: N/A; 10/12/2009: LUMBAR FUSION 10/12/2009: LUMBAR LAMINECTOMY 08/2013: REPLACEMENT TOTAL KNEE; Right  BMI    Body Mass Index: 27.12 kg/m      Reproductive/Obstetrics negative OB ROS                             Anesthesia Physical Anesthesia Plan  ASA: 3  Anesthesia Plan: General   Post-op Pain Management:    Induction: Intravenous  PONV Risk Score and Plan: Propofol infusion and TIVA  Airway Management Planned: Natural Airway and Nasal Cannula  Additional Equipment:   Intra-op Plan:   Post-operative Plan:   Informed  Consent: I have reviewed the patients History and Physical, chart, labs and discussed the procedure including the risks, benefits and alternatives for the proposed anesthesia with the patient or authorized representative who has indicated his/her understanding and acceptance.     Dental Advisory Given  Plan Discussed with: Anesthesiologist, CRNA and Surgeon  Anesthesia Plan Comments: (Patient consented for risks of anesthesia including but not limited to:  - adverse reactions to medications - risk of airway placement if required - damage to eyes, teeth, lips or other oral mucosa - nerve damage due to positioning  - sore throat or hoarseness - Damage to heart, brain, nerves, lungs, other parts of body or loss of life  Patient voiced understanding.)        Anesthesia Quick Evaluation

## 2021-09-27 NOTE — Op Note (Signed)
[facility] Gastroenterology Patient Name: Keyion Eischeid Procedure Date: 09/27/2021 8:23 AM MRN: [id number] Account #: [id number] Date of Birth: 30-Nov-1945 Admit Type: Outpatient Age: 76 Room: [facility] ENDO ROOM 4 Gender: Male Note Status: Finalized Instrument Name: Jasper Riling [id number] Procedure:             Colonoscopy Indications:           Surveillance: Personal history of colonic polyps                         (unknown histology) on last colonoscopy 5 years ago Providers:             Jonathon Bellows MD, MD Referring MD:          Delsa Grana (Referring MD) Medicines:             Monitored Anesthesia Care Complications:         No immediate complications. Procedure:             Pre-Anesthesia Assessment:                        - Prior to the procedure, a History and Physical was                         performed, and patient medications, allergies and                         sensitivities were reviewed. The patient's tolerance                         of previous anesthesia was reviewed.                        - The risks and benefits of the procedure and the                         sedation options and risks were discussed with the                         patient. All questions were answered and informed                         consent was obtained.                        - ASA Grade Assessment: II - A patient with mild                         systemic disease.                        After obtaining informed consent, the colonoscope was                         passed under direct vision. Throughout the procedure,                         the patient's blood pressure, pulse, and oxygen  saturations were monitored continuously. The                         Colonoscope was introduced through the anus and                         advanced to the the cecum, identified by the                         appendiceal orifice. The colonoscopy was performed                          with ease. The patient tolerated the procedure well.                         The quality of the bowel preparation was excellent. Findings:      The perianal and digital rectal examinations were normal.      Multiple small and large-mouthed diverticula were found in the entire       colon.      The exam was otherwise without abnormality on direct and retroflexion       views. Impression:            - Diverticulosis in the entire examined colon.                        - The examination was otherwise normal on direct and                         retroflexion views.                        - No specimens collected. Recommendation:        - Discharge patient to home (with escort).                        - Resume previous diet.                        - Continue present medications.                        - Repeat colonoscopy is not recommended due to current                         age (38 years or older) for surveillance. Procedure Code(s):     --- Professional ---                        [phone], Colonoscopy, flexible; diagnostic, including                         collection of specimen(s) by brushing or washing, when                         performed (separate procedure) Diagnosis Code(s):     --- Professional ---                        Z86.010, Personal history of colonic polyps  K57.30, Diverticulosis of large intestine without                         perforation or abscess without bleeding CPT copyright 2019 American Medical Association. All rights reserved. The codes documented in this report are preliminary and upon coder review may  be revised to meet current compliance requirements. Wyline Mood, MD Wyline Mood MD, MD 09/27/2021 8:52:01 AM This report has been signed electronically. Number of Addenda: 0 Note Initiated On: 09/27/2021 8:23 AM Scope Withdrawal Time: 0 hours 9 minutes 21 seconds  Total Procedure Duration: 0 hours 13 minutes 8 seconds   Estimated Blood Loss:  Estimated blood loss: none.      [facility]

## 2021-09-27 NOTE — Transfer of Care (Signed)
Immediate Anesthesia Transfer of Care Note  Patient: Lonnie Huber  Procedure(s) Performed: COLONOSCOPY WITH PROPOFOL  Patient Location: PACU and Endoscopy Unit  Anesthesia Type:General  Level of Consciousness: awake, drowsy and patient cooperative  Airway & Oxygen Therapy: Patient Spontanous Breathing  Post-op Assessment: Report given to RN and Post -op Vital signs reviewed and stable  Post vital signs: Reviewed and stable  Last Vitals:  Vitals Value Taken Time  BP 109/89 09/27/21 0854  Temp    Pulse 102 09/27/21 0853  Resp 29 09/27/21 0853  SpO2 94 % 09/27/21 0853  Vitals shown include unvalidated device data.  Last Pain:  Vitals:   09/27/21 0854  TempSrc:   PainSc: 0-No pain         Complications: No notable events documented.

## 2021-09-27 NOTE — H&P (Signed)
Wyline Mood, MD [address], [place], [place], [address] [address], [place], [place], [address] Phone: [phone]  Fax: [phone]  Primary Care Physician:  Danelle Berry, PA-C   Pre-Procedure History & Physical: HPI:  Lonnie Huber is a 76 y.o. male is here for an colonoscopy.   Past Medical History:  Diagnosis Date   Alcohol abuse 09/20/2014   Decreased libido 10/20/2007   Failure of erection 09/20/2014   Gout    Hyperlipidemia    Hypertension    Thyroid disease     Past Surgical History:  Procedure Laterality Date   COLONOSCOPY WITH PROPOFOL N/A 10/10/2016   Procedure: COLONOSCOPY WITH PROPOFOL;  Surgeon: Wyline Mood, MD;  Location: [facility] ENDOSCOPY;  Service: Endoscopy;  Laterality: N/A;   LUMBAR FUSION  10/12/2009   LUMBAR LAMINECTOMY  10/12/2009   REPLACEMENT TOTAL KNEE Right 08/2013    Prior to Admission medications   Medication Sig Start Date End Date Taking? Authorizing Provider  amLODipine (NORVASC) 10 MG tablet TAKE 1 TABLET EVERY DAY 04/27/21  Yes Danelle Berry, PA-C  atorvastatin (LIPITOR) 40 MG tablet TAKE 1 TABLET EVERY DAY 09/13/21  Yes Danelle Berry, PA-C  losartan (COZAAR) 100 MG tablet Take 1 tablet (100 mg total) by mouth daily. 09/03/21  Yes Danelle Berry, PA-C  allopurinol (ZYLOPRIM) 100 MG tablet Take 1 tablet (100 mg total) by mouth daily. 09/03/21   Danelle Berry, PA-C  Omega 3 1000 MG CAPS Take 3 capsules (3,000 mg total) by mouth daily. 03/31/18   Kerman Passey, MD    Allergies as of 08/21/2021 - Review Complete 03/05/2021  Allergen Reaction Noted   Fluzone [influenza virus vaccine]  12/21/2014    Family History  Problem Relation Age of Onset   Coronary artery disease Mother    Congestive Heart Failure Father    Coronary artery disease Brother    COPD Sister    Breast cancer Sister 66   Heart attack Cousin    Diabetes Cousin     Social History   Socioeconomic History   Marital status: Married    Spouse name: Not  on file   Number of children: Not on file   Years of education: Not on file   Highest education level: Not on file  Occupational History   Occupation: retired  Tobacco Use   Smoking status: Former    Types: Cigarettes    Quit date: 09/20/1978    Years since quitting: 43.0   Smokeless tobacco: Never  Vaping Use   Vaping Use: Never used  Substance and Sexual Activity   Alcohol use: Yes    Alcohol/week: 0.0 - 21.0 standard drinks of alcohol    Comment: " i drink when I want to drink' stated pt   Drug use: No   Sexual activity: Yes  Other Topics Concern   Not on file  Social History Narrative   Not on file   Social Determinants of Health   Financial Resource Strain: High Risk (08/20/2021)   Overall Financial Resource Strain (CARDIA)    Difficulty of Paying Living Expenses: Hard  Food Insecurity: Food Insecurity Present (08/20/2021)   Hunger Vital Sign    Worried About Running Out of Food in the Last Year: Sometimes true    Ran Out of Food in the Last Year: Never true  Transportation Needs: No Transportation Needs (08/20/2021)   PRAPARE - Administrator, Civil Service (Medical): No    Lack of Transportation (Non-Medical): No  Physical Activity: Inactive (08/20/2021)   Exercise Vital Sign    Days of Exercise per Week: 0 days    Minutes of Exercise per Session: 0 min  Stress: No Stress Concern Present (08/20/2021)   [employer] of Occupational Health - Occupational Stress Questionnaire    Feeling of Stress : Not at all  Social Connections: Moderately Isolated (08/20/2021)   Social Connection and Isolation Panel [NHANES]    Frequency of Communication with Friends and Family: More than three times a week    Frequency of Social Gatherings with Friends and Family: Three times a week    Attends Religious Services: Never    Active Member of Clubs or Organizations: No    Attends Banker Meetings: Never    Marital Status: Married  Catering manager Violence: Not  At Risk (08/20/2021)   Humiliation, Afraid, Rape, and Kick questionnaire    Fear of Current or Ex-Partner: No    Emotionally Abused: No    Physically Abused: No    Sexually Abused: No    Review of Systems: See HPI, otherwise negative ROS  Physical Exam: BP 140/75   Pulse 85   Temp (!) 96.4 F (35.8 C) (Temporal)   Resp 18   Ht 6' (1.829 m)   Wt 90.7 kg   SpO2 100%   BMI 27.12 kg/m  General:   Alert,  pleasant and cooperative in NAD Head:  Normocephalic and atraumatic. Neck:  Supple; no masses or thyromegaly. Lungs:  Clear throughout to auscultation, normal respiratory effort.    Heart:  +S1, +S2, Regular rate and rhythm, No edema. Abdomen:  Soft, nontender and nondistended. Normal bowel sounds, without guarding, and without rebound.   Neurologic:  Alert and  oriented x4;  grossly normal neurologically.  Impression/Plan: Lonnie Huber is here for an colonoscopy to be performed for surveillance due to prior history of colon polyps   Risks, benefits, limitations, and alternatives regarding  colonoscopy have been reviewed with the patient.  Questions have been answered.  All parties agreeable.   Wyline Mood, MD  09/27/2021, 8:18 AM

## 2021-09-28 NOTE — Progress Notes (Signed)
Non-identified Voicemail.  No Message Left. 

## 2021-09-30 ENCOUNTER — Encounter: Payer: Self-pay | Admitting: Gastroenterology

## 2021-11-06 ENCOUNTER — Telehealth: Payer: Self-pay | Admitting: Family Medicine

## 2021-11-06 NOTE — Telephone Encounter (Signed)
Copied from CRM [id number]. Topic: General - Call Back - No Documentation >> Nov 06, 2021 10:43 AM Franchot Heidelberg wrote: Reason for CRM: Pt called back to check the status of his disability paperwork. Please advise   Best contact: [phone]

## 2021-11-06 NOTE — Telephone Encounter (Signed)
Called pt to inform him Sheliah Mends is currently out of the office and we will call him once paperwork has been filled out.

## 2021-12-06 ENCOUNTER — Other Ambulatory Visit: Payer: Self-pay | Admitting: Family Medicine

## 2021-12-06 DIAGNOSIS — Z5181 Encounter for therapeutic drug level monitoring: Secondary | ICD-10-CM

## 2021-12-06 DIAGNOSIS — I10 Essential (primary) hypertension: Secondary | ICD-10-CM

## 2021-12-09 ENCOUNTER — Other Ambulatory Visit: Payer: Self-pay

## 2021-12-09 DIAGNOSIS — Z5181 Encounter for therapeutic drug level monitoring: Secondary | ICD-10-CM

## 2021-12-09 DIAGNOSIS — M109 Gout, unspecified: Secondary | ICD-10-CM

## 2021-12-09 MED ORDER — ALLOPURINOL 100 MG PO TABS: 100.0000 mg | ORAL_TABLET | Freq: Every day | ORAL | 2 refills | Status: DC

## 2022-02-12 ENCOUNTER — Other Ambulatory Visit: Payer: Self-pay | Admitting: Family Medicine

## 2022-02-12 DIAGNOSIS — E7800 Pure hypercholesterolemia, unspecified: Secondary | ICD-10-CM

## 2022-02-12 DIAGNOSIS — Z5181 Encounter for therapeutic drug level monitoring: Secondary | ICD-10-CM

## 2022-02-12 NOTE — Telephone Encounter (Signed)
last RF 09/13/21 #90 3 RF too soon  Requested Prescriptions  Refused Prescriptions Disp Refills  . atorvastatin (LIPITOR) 40 MG tablet 90 tablet 3    Sig: Take 1 tablet (40 mg total) by mouth daily.     Cardiovascular:  Antilipid - Statins Failed - 02/12/2022  4:22 PM      Failed - Lipid Panel in normal range within the last 12 months    Cholesterol, Total  Date Value Ref Range Status  04/24/2015 182 100 - 199 mg/dL Final   Cholesterol  Date Value Ref Range Status  03/05/2021 106 <200 mg/dL Final   LDL Cholesterol (Calc)  Date Value Ref Range Status  03/05/2021 47 mg/dL (calc) Final    Comment:    Reference range: <100 . Desirable range <100 mg/dL for primary prevention;   <70 mg/dL for patients with CHD or diabetic patients  with > or = 2 CHD risk factors. Marland Kitchen LDL-C is now calculated using the Martin-Hopkins  calculation, which is a validated novel method providing  better accuracy than the Friedewald equation in the  estimation of LDL-C.  Cresenciano Genre et al. Annamaria Helling. 6834;196(22): 2061-2068  (http://education.[employer].com/faq/FAQ164)    HDL  Date Value Ref Range Status  03/05/2021 46 > OR = 40 mg/dL Final  04/24/2015 58 >39 mg/dL Final   Triglycerides  Date Value Ref Range Status  03/05/2021 53 <150 mg/dL Final         Passed - Patient is not pregnant      Passed - Valid encounter within last 12 months    Recent Outpatient Visits          5 months ago Benign essential HTN   [facility] Delsa Grana, PA-C   11 months ago Primary hypertension   [facility] Delsa Grana, PA-C   1 year ago Unsteady gait when walking   [facility], NP   1 year ago Hypertension, unspecified type   [facility] Delsa Grana, PA-C   1 year ago Hypertension, unspecified type   [facility] Delsa Grana, PA-C      Future Appointments            In 2 weeks Delsa Grana, PA-C [facility], [facility]

## 2022-02-12 NOTE — Telephone Encounter (Signed)
Medication Refill - Medication: atorvastatin (LIPITOR) 40 MG tablet  Has the patient contacted their pharmacy? Yes.   Pt told to contact provider  Preferred Pharmacy (with phone number or street name):  [place], [employer] Phone:  [phone]  Fax:  [phone]     Has the patient been seen for an appointment in the last year OR does the patient have an upcoming appointment? Yes.    Agent: Please be advised that RX refills may take up to 3 business days. We ask that you follow-up with your pharmacy.

## 2022-03-04 ENCOUNTER — Ambulatory Visit: Payer: Medicare HMO | Admitting: Family Medicine

## 2022-03-04 DIAGNOSIS — I10 Essential (primary) hypertension: Secondary | ICD-10-CM

## 2022-03-04 DIAGNOSIS — E7800 Pure hypercholesterolemia, unspecified: Secondary | ICD-10-CM

## 2022-03-04 DIAGNOSIS — N182 Chronic kidney disease, stage 2 (mild): Secondary | ICD-10-CM

## 2022-03-07 ENCOUNTER — Ambulatory Visit: Payer: Medicare HMO | Admitting: Family Medicine

## 2022-03-11 ENCOUNTER — Ambulatory Visit: Payer: Medicare HMO | Admitting: Family Medicine

## 2022-03-12 ENCOUNTER — Ambulatory Visit: Payer: Medicare HMO | Admitting: Nurse Practitioner

## 2022-03-21 ENCOUNTER — Ambulatory Visit: Payer: Medicare HMO | Admitting: Family Medicine

## 2022-03-25 ENCOUNTER — Encounter: Payer: Self-pay | Admitting: Family Medicine

## 2022-03-25 ENCOUNTER — Ambulatory Visit (INDEPENDENT_AMBULATORY_CARE_PROVIDER_SITE_OTHER): Payer: Medicare HMO | Admitting: Family Medicine

## 2022-03-25 VITALS — BP 130/74 | HR 97 | Temp 97.5°F | Resp 16 | Ht 72.0 in | Wt 202.6 lb

## 2022-03-25 DIAGNOSIS — N182 Chronic kidney disease, stage 2 (mild): Secondary | ICD-10-CM

## 2022-03-25 DIAGNOSIS — F1010 Alcohol abuse, uncomplicated: Secondary | ICD-10-CM | POA: Diagnosis not present

## 2022-03-25 DIAGNOSIS — M109 Gout, unspecified: Secondary | ICD-10-CM

## 2022-03-25 DIAGNOSIS — M5441 Lumbago with sciatica, right side: Secondary | ICD-10-CM

## 2022-03-25 DIAGNOSIS — M5442 Lumbago with sciatica, left side: Secondary | ICD-10-CM

## 2022-03-25 DIAGNOSIS — I10 Essential (primary) hypertension: Secondary | ICD-10-CM

## 2022-03-25 DIAGNOSIS — M25561 Pain in right knee: Secondary | ICD-10-CM | POA: Diagnosis not present

## 2022-03-25 DIAGNOSIS — E7800 Pure hypercholesterolemia, unspecified: Secondary | ICD-10-CM | POA: Diagnosis not present

## 2022-03-25 DIAGNOSIS — G8929 Other chronic pain: Secondary | ICD-10-CM

## 2022-03-25 DIAGNOSIS — Z5181 Encounter for therapeutic drug level monitoring: Secondary | ICD-10-CM

## 2022-03-25 NOTE — Progress Notes (Unsigned)
Name: Lonnie Huber   MRN: [id number]    DOB: 04-08-1946   Date:03/25/2022       Progress Note  Chief Complaint  Patient presents with   Follow-up   Hypertension   Hyperlipidemia     Subjective:   Lonnie Huber is a 76 y.o. male, presents to clinic for f/up on routine conditions  Hypertension:  Currently managed on amlodipine 10 and losartan 100mg  Pt reports good med compliance and denies any SE.   Blood pressure today is well controlled. BP Readings from Last 3 Encounters:  03/25/22 130/74  09/27/21 118/70  09/03/21 116/70   Pt denies CP, SOB, exertional sx, LE edema, palpitation, Ha's, visual disturbances, lightheadedness, hypotension, syncope. Dietary efforts for BP?  none   Hyperlipidemia: Currently treated with lipitor 40 mg , pt reports good med compliance Last Lipids: Lab Results  Component Value Date   CHOL 106 03/05/2021   HDL 46 03/05/2021   LDLCALC 47 03/05/2021   TRIG 53 03/05/2021   CHOLHDL 2.3 03/05/2021   - Denies: Chest pain, shortness of breath, myalgias, claudication   Lab Results  Component Value Date   LABURIC 4.2 03/05/2021   ETOH - still drinking and "sometimes he goes a little bit overboard" with social drinking or with hard issued with her family members health he gets upset and drinks more to cope   Current Outpatient Medications:    allopurinol (ZYLOPRIM) 100 MG tablet, Take 1 tablet (100 mg total) by mouth daily., Disp: 90 tablet, Rfl: 2   amLODipine (NORVASC) 10 MG tablet, TAKE 1 TABLET EVERY DAY, Disp: 90 tablet, Rfl: 1   atorvastatin (LIPITOR) 40 MG tablet, TAKE 1 TABLET EVERY DAY, Disp: 90 tablet, Rfl: 3   losartan (COZAAR) 100 MG tablet, TAKE 1 TABLET EVERY DAY, Disp: 90 tablet, Rfl: 3   Omega 3 1000 MG CAPS, Take 3 capsules (3,000 mg total) by mouth daily., Disp: , Rfl:   Patient Active Problem List   Diagnosis Date Noted   Hyperlipidemia 04/24/2015   Chronic kidney disease (CKD), stage II (mild) 09/20/2014   Chronic  low back pain 09/20/2014   Diverticulosis of colon 09/20/2014   Gout 09/20/2014   Chronic pain of right knee 09/20/2014   Nondependent alcohol abuse, episodic drinking behavior 07/10/2008   Benign essential HTN 06/18/2007    Past Surgical History:  Procedure Laterality Date   COLONOSCOPY WITH PROPOFOL N/A 10/10/2016   Procedure: COLONOSCOPY WITH PROPOFOL;  Surgeon: 10/12/2016, MD;  Location: [facility] ENDOSCOPY;  Service: Endoscopy;  Laterality: N/A;   COLONOSCOPY WITH PROPOFOL N/A 09/27/2021   Procedure: COLONOSCOPY WITH PROPOFOL;  Surgeon: 09/29/2021, MD;  Location: [facility] ENDOSCOPY;  Service: Gastroenterology;  Laterality: N/A;   LUMBAR FUSION  10/12/2009   LUMBAR LAMINECTOMY  10/12/2009   REPLACEMENT TOTAL KNEE Right 08/2013    Family History  Problem Relation Age of Onset   Coronary artery disease Mother    Congestive Heart Failure Father    Coronary artery disease Brother    COPD Sister    Breast cancer Sister 16   Heart attack Cousin    Diabetes Cousin     Social History   Tobacco Use   Smoking status: Former    Types: Cigarettes    Quit date: 09/20/1978    Years since quitting: 43.5   Smokeless tobacco: Never  Vaping Use   Vaping Use: Never used  Substance Use Topics   Alcohol use: Yes    Alcohol/week: 0.0 - 21.0  standard drinks of alcohol    Comment: " i drink when I want to drink' stated pt   Drug use: No     Allergies  Allergen Reactions   Fluzone [Influenza Virus Vaccine]     Health Maintenance  Topic Date Due   COVID-19 Vaccine (5 - 2023-24 season) 04/10/2022 (Originally 12/13/2021)   Zoster Vaccines- Shingrix (1 of 2) 06/24/2022 (Originally 05/15/1997)   INFLUENZA VACCINE  07/13/2022 (Originally 11/12/2021)   DTaP/Tdap/Td (2 - Td or Tdap) 04/26/2022   Medicare Annual Wellness (AWV)  08/21/2022   COLONOSCOPY (Pts 45-21yrs Insurance coverage will need to be confirmed)  09/28/2026   Pneumonia Vaccine 67+ Years old  Completed   Hepatitis C Screening  Completed    HPV VACCINES  Aged Out    Chart Review Today: I personally reviewed active problem list, medication list, allergies, family history, social history, health maintenance, notes from last encounter, lab results, imaging with the patient/caregiver today.   Review of Systems  Constitutional: Negative.   HENT: Negative.    Eyes: Negative.   Respiratory: Negative.    Cardiovascular: Negative.   Gastrointestinal: Negative.   Endocrine: Negative.   Genitourinary: Negative.   Musculoskeletal: Negative.   Skin: Negative.   Allergic/Immunologic: Negative.   Neurological: Negative.   Hematological: Negative.   Psychiatric/Behavioral: Negative.    All other systems reviewed and are negative.    Objective:   Vitals:   03/25/22 0954  BP: 130/74  Pulse: 97  Resp: 16  Temp: (!) 97.5 F (36.4 C)  TempSrc: Oral  SpO2: 98%  Weight: 202 lb 9.6 oz (91.9 kg)  Height: 6' (1.829 m)    Body mass index is 27.48 kg/m.  Physical Exam Vitals and nursing note reviewed.  Constitutional:      General: He is not in acute distress.    Appearance: Normal appearance. He is well-developed. He is not ill-appearing, toxic-appearing or diaphoretic.  HENT:     Head: Normocephalic and atraumatic.     Right Ear: External ear normal.     Left Ear: External ear normal.     Nose: Nose normal.  Eyes:     General: No scleral icterus.       Right eye: No discharge.        Left eye: No discharge.     Conjunctiva/sclera: Conjunctivae normal.  Neck:     Trachea: No tracheal deviation.  Cardiovascular:     Rate and Rhythm: Normal rate and regular rhythm.     Pulses: Normal pulses.     Heart sounds: Normal heart sounds. No murmur heard.    No friction rub. No gallop.  Pulmonary:     Effort: Pulmonary effort is normal. No respiratory distress.     Breath sounds: Normal breath sounds. No stridor. No wheezing, rhonchi or rales.  Abdominal:     General: Bowel sounds are normal. There is no distension.      Palpations: Abdomen is soft.  Musculoskeletal:        General: Normal range of motion.     Right lower leg: No edema.     Left lower leg: No edema.  Skin:    General: Skin is warm and dry.     Findings: No rash.  Neurological:     Mental Status: He is alert.     Motor: No abnormal muscle tone.     Coordination: Coordination normal.  Psychiatric:        Mood and Affect: Mood normal.  Behavior: Behavior normal.         Assessment & Plan:   Problem List Items Addressed This Visit       Cardiovascular and Mediastinum   Benign essential HTN - Primary (Chronic)    Well-controlled today, 130/74, much better than prior OV Continue amlodipine 10 mg and losartan 100 mg daily Encouraged to decrease EtOH again and continue diet and lifestyle efforts due for labs      Relevant Orders   COMPLETE METABOLIC PANEL WITH GFR (Completed)   Lipid panel (Completed)     Genitourinary   Chronic kidney disease (CKD), stage II (mild)    Monitoring renal function, losartan max dose for renal protection, avoid NSAIDs Goody powders and decreased EtOH      Relevant Orders   COMPLETE METABOLIC PANEL WITH GFR (Completed)     Other   Hyperlipidemia (Chronic)   Relevant Orders   COMPLETE METABOLIC PANEL WITH GFR (Completed)   Lipid panel (Completed)   Nondependent alcohol abuse, episodic drinking behavior    Some increased alcohol consumption recently with his sister who is ill in the hospital, self-medicating      Chronic low back pain   Gout    No recent flares, recheck uric acid annually and continue allopurinol -dose adjustments per lab results      Relevant Orders   COMPLETE METABOLIC PANEL WITH GFR (Completed)   Uric acid (Completed)   Chronic pain of right knee   Other Visit Diagnoses     Medication monitoring encounter       Relevant Orders   COMPLETE METABOLIC PANEL WITH GFR (Completed)   Lipid panel (Completed)   CBC with Differential/Platelet (Completed)   Uric  acid (Completed)        No follow-ups on file.   Danelle Berry, PA-C 03/25/22 10:14 AM

## 2022-03-26 LAB — CBC WITH DIFFERENTIAL/PLATELET
Absolute Monocytes: 470 cells/uL (ref 200–950)
Basophils Absolute: 22 cells/uL (ref 0–200)
Basophils Relative: 0.4 %
Eosinophils Absolute: 49 cells/uL (ref 15–500)
Eosinophils Relative: 0.9 %
HCT: 41 % (ref 38.5–50.0)
Hemoglobin: 14.1 g/dL (ref 13.2–17.1)
Lymphs Abs: 1642 cells/uL (ref 850–3900)
MCH: 30.4 pg (ref 27.0–33.0)
MCHC: 34.4 g/dL (ref 32.0–36.0)
MCV: 88.4 fL (ref 80.0–100.0)
MPV: 10.9 fL (ref 7.5–12.5)
Monocytes Relative: 8.7 %
Neutro Abs: 3218 cells/uL (ref 1500–7800)
Neutrophils Relative %: 59.6 %
Platelets: 232 10*3/uL (ref 140–400)
RBC: 4.64 10*6/uL (ref 4.20–5.80)
RDW: 13.4 % (ref 11.0–15.0)
Total Lymphocyte: 30.4 %
WBC: 5.4 10*3/uL (ref 3.8–10.8)

## 2022-03-26 LAB — COMPLETE METABOLIC PANEL WITH GFR
AG Ratio: 1.1 (calc) (ref 1.0–2.5)
ALT: 47 U/L — ABNORMAL HIGH (ref 9–46)
AST: 47 U/L — ABNORMAL HIGH (ref 10–35)
Albumin: 3.9 g/dL (ref 3.6–5.1)
Alkaline phosphatase (APISO): 62 U/L (ref 35–144)
BUN: 10 mg/dL (ref 7–25)
CO2: 27 mmol/L (ref 20–32)
Calcium: 9.7 mg/dL (ref 8.6–10.3)
Chloride: 100 mmol/L (ref 98–110)
Creat: 1.16 mg/dL (ref 0.70–1.28)
Globulin: 3.4 g/dL (calc) (ref 1.9–3.7)
Glucose, Bld: 103 mg/dL — ABNORMAL HIGH (ref 65–99)
Potassium: 4.1 mmol/L (ref 3.5–5.3)
Sodium: 136 mmol/L (ref 135–146)
Total Bilirubin: 0.7 mg/dL (ref 0.2–1.2)
Total Protein: 7.3 g/dL (ref 6.1–8.1)
eGFR: 66 mL/min/{1.73_m2} (ref >=60)

## 2022-03-26 LAB — URIC ACID: Uric Acid, Serum: 3.2 mg/dL — ABNORMAL LOW (ref 4.0–8.0)

## 2022-03-26 LAB — LIPID PANEL
Cholesterol: 130 mg/dL (ref <200)
HDL: 51 mg/dL (ref >=40)
LDL Cholesterol (Calc): 65 mg/dL (calc)
Non-HDL Cholesterol (Calc): 79 mg/dL (calc) (ref <130)
Total CHOL/HDL Ratio: 2.5 (calc) (ref <5.0)
Triglycerides: 62 mg/dL (ref <150)

## 2022-03-27 NOTE — Assessment & Plan Note (Signed)
No recent flares, recheck uric acid annually and continue allopurinol -dose adjustments per lab results

## 2022-03-27 NOTE — Assessment & Plan Note (Signed)
Well-controlled today, 130/74, much better than prior OV Continue amlodipine 10 mg and losartan 100 mg daily Encouraged to decrease EtOH again and continue diet and lifestyle efforts due for labs

## 2022-03-27 NOTE — Assessment & Plan Note (Signed)
Some increased alcohol consumption recently with his sister who is ill in the hospital, self-medicating

## 2022-03-27 NOTE — Assessment & Plan Note (Signed)
Monitoring renal function, losartan max dose for renal protection, avoid NSAIDs Goody powders and decreased EtOH

## 2022-03-31 ENCOUNTER — Telehealth: Payer: Self-pay

## 2022-03-31 NOTE — Telephone Encounter (Signed)
Pt called for lab results - Shared provider's note.  Pt will cut back on alcohol.  Danelle Berry, PA-C 03/26/2022  5:20 PM EST     Please let the pt know his cholesterol, kidney function, electrolytes and uric acid all look good He had no anemia His liver enzymes were a little elevated - try to cut back on alcohol amount and/or frequency But overall everything looked great - and we will keep an eye on the liver

## 2022-08-01 ENCOUNTER — Other Ambulatory Visit: Payer: Self-pay

## 2022-08-01 ENCOUNTER — Emergency Department: Payer: Medicare HMO

## 2022-08-01 ENCOUNTER — Encounter: Payer: Self-pay | Admitting: Intensive Care

## 2022-08-01 ENCOUNTER — Emergency Department
Admission: EM | Admit: 2022-08-01 | Discharge: 2022-08-01 | Disposition: A | Discharge status: Home / Self Care | Payer: Medicare HMO | Attending: Emergency Medicine | Admitting: Emergency Medicine

## 2022-08-01 DIAGNOSIS — M8588 Other specified disorders of bone density and structure, other site: Secondary | ICD-10-CM | POA: Diagnosis not present

## 2022-08-01 DIAGNOSIS — S39012A Strain of muscle, fascia and tendon of lower back, initial encounter: Secondary | ICD-10-CM

## 2022-08-01 DIAGNOSIS — S3992XA Unspecified injury of lower back, initial encounter: Secondary | ICD-10-CM | POA: Diagnosis present

## 2022-08-01 DIAGNOSIS — Y92410 Unspecified street and highway as the place of occurrence of the external cause: Secondary | ICD-10-CM | POA: Diagnosis not present

## 2022-08-01 MED ORDER — METHOCARBAMOL 500 MG PO TABS: 500.0000 mg | ORAL_TABLET | Freq: Four times a day (QID) | ORAL | 0 refills | Status: DC

## 2022-08-01 MED ORDER — PREDNISONE 50 MG PO TABS: 50.0000 mg | ORAL_TABLET | Freq: Every day | ORAL | 0 refills | Status: DC
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
## 2022-08-01 NOTE — ED Notes (Signed)
Patient declined discharge vital signs. 

## 2022-08-05 ENCOUNTER — Telehealth: Payer: Self-pay
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
## 2022-09-19 NOTE — Progress Notes (Deleted)
I connected with  Lonnie Huber on 09/19/22 by a audio enabled telemedicine application and verified that I am speaking with the correct person using two identifiers.  Patient Location: Home  Provider Location: Office/Clinic  I discussed the limitations of evaluation and management by telemedicine. The patient expressed understanding and agreed to proceed.  Subjective:   Lonnie Huber is a 77 y.o. male who presents for Medicare Annual/Subsequent preventive examination.  Review of Systems    ***       Objective:    There were no vitals filed for this visit. There is no height or weight on file to calculate BMI.     08/01/2022    5:04 PM 09/27/2021    7:55 AM 08/20/2021    3:21 PM 08/14/2020    8:33 AM 07/28/2019    2:30 PM 02/09/2018    8:15 AM 01/27/2018   10:39 AM  Advanced Directives  Does Patient Have a Medical Advance Directive? No No No No No  No  Would patient like information on creating a medical advance directive? No - Patient declined  No - Patient declined No - Patient declined No - Patient declined No - Patient declined No - Patient declined    Current Medications (verified) Outpatient Encounter Medications as of 09/19/2022  Medication Sig   allopurinol (ZYLOPRIM) 100 MG tablet Take 1 tablet (100 mg total) by mouth daily.   amLODipine (NORVASC) 10 MG tablet TAKE 1 TABLET EVERY DAY   atorvastatin (LIPITOR) 40 MG tablet TAKE 1 TABLET EVERY DAY   losartan (COZAAR) 100 MG tablet TAKE 1 TABLET EVERY DAY   methocarbamol (ROBAXIN) 500 MG tablet Take 1 tablet (500 mg total) by mouth 4 (four) times daily.   Omega 3 1000 MG CAPS Take 3 capsules (3,000 mg total) by mouth daily.   predniSONE (DELTASONE) 50 MG tablet Take 1 tablet (50 mg total) by mouth daily with breakfast.   No facility-administered encounter medications on file as of 09/19/2022.    Allergies (verified) Fluzone [influenza virus vaccine]   History: Past Medical History:  Diagnosis Date   Alcohol  abuse 09/20/2014   Decreased libido 10/20/2007   Failure of erection 09/20/2014   Gout    Hyperlipidemia    Hypertension    Thyroid disease    Past Surgical History:  Procedure Laterality Date   COLONOSCOPY WITH PROPOFOL N/A 10/10/2016   Procedure: COLONOSCOPY WITH PROPOFOL;  Surgeon: Wyline Mood, MD;  Location: [facility] ENDOSCOPY;  Service: Endoscopy;  Laterality: N/A;   COLONOSCOPY WITH PROPOFOL N/A 09/27/2021   Procedure: COLONOSCOPY WITH PROPOFOL;  Surgeon: Wyline Mood, MD;  Location: [facility] ENDOSCOPY;  Service: Gastroenterology;  Laterality: N/A;   LUMBAR FUSION  10/12/2009   LUMBAR LAMINECTOMY  10/12/2009   REPLACEMENT TOTAL KNEE Right 08/2013   Family History  Problem Relation Age of Onset   Coronary artery disease Mother    Congestive Heart Failure Father    Coronary artery disease Brother    COPD Sister    Breast cancer Sister 70   Heart attack Cousin    Diabetes Cousin    Social History   Socioeconomic History   Marital status: Married    Spouse name: Not on file   Number of children: Not on file   Years of education: Not on file   Highest education level: Not on file  Occupational History   Occupation: retired  Tobacco Use   Smoking status: Former    Types: Cigarettes    Quit date:  09/20/1978    Years since quitting: 44.0   Smokeless tobacco: Never  Vaping Use   Vaping Use: Never used  Substance and Sexual Activity   Alcohol use: Not Currently    Alcohol/week: 22.0 standard drinks of alcohol    Types: 12 Cans of beer, 10 Shots of liquor per week   Drug use: No   Sexual activity: Yes  Other Topics Concern   Not on file  Social History Narrative   Not on file   Social Determinants of Health   Financial Resource Strain: High Risk (08/20/2021)   Overall Financial Resource Strain (CARDIA)    Difficulty of Paying Living Expenses: Hard  Food Insecurity: Food Insecurity Present (08/20/2021)   Hunger Vital Sign    Worried About Running Out of Food in the Last Year:  Sometimes true    Ran Out of Food in the Last Year: Never true  Transportation Needs: No Transportation Needs (08/20/2021)   PRAPARE - Administrator, Civil Service (Medical): No    Lack of Transportation (Non-Medical): No  Physical Activity: Inactive (08/20/2021)   Exercise Vital Sign    Days of Exercise per Week: 0 days    Minutes of Exercise per Session: 0 min  Stress: No Stress Concern Present (08/20/2021)   [employer] of Occupational Health - Occupational Stress Questionnaire    Feeling of Stress : Not at all  Social Connections: Moderately Isolated (08/20/2021)   Social Connection and Isolation Panel [NHANES]    Frequency of Communication with Friends and Family: More than three times a week    Frequency of Social Gatherings with Friends and Family: Three times a week    Attends Religious Services: Never    Active Member of Clubs or Organizations: No    Attends Banker Meetings: Never    Marital Status: Married    Tobacco Counseling Counseling given: Not Answered   Clinical Intake:                 Diabetic?***         Activities of Daily Living    03/25/2022    9:53 AM  In your present state of health, do you have any difficulty performing the following activities:  Hearing? 0  Vision? 0  Difficulty concentrating or making decisions? 0  Walking or climbing stairs? 1  Dressing or bathing? 0  Doing errands, shopping? 0    Patient Care Team: Danelle Berry, PA-C as PCP - General (Family Medicine) Morene Crocker, MD as Referring Physician (Neurology) Andee Poles, [facility] (Inactive) (Pharmacist)  Indicate any recent Medical Services you may have received from other than [facility] providers in the past year (date may be approximate).     Assessment:   This is a routine wellness examination for Lonnie Huber.  Hearing/Vision screen No results found.  Dietary issues and exercise activities discussed:     Goals Addressed   None     Depression Screen    03/25/2022    9:53 AM 09/03/2021    9:40 AM 08/20/2021    3:14 PM 03/05/2021    8:27 AM 09/04/2020    7:56 AM 08/14/2020    8:25 AM 03/22/2020    9:47 AM  PHQ 2/9 Scores  PHQ - 2 Score 0 1 0 0 0 2 0  PHQ- 9 Score 0 2  0  4     Fall Risk    03/25/2022    9:53 AM 09/03/2021    9:39  AM 08/20/2021    3:21 PM 03/05/2021    8:27 AM 09/04/2020    7:55 AM  Fall Risk   Falls in the past year? 0 0 0 0 0  Number falls in past yr: 0  0 0 0  Injury with Fall? 0  0 0 0  Risk for fall due to : No Fall Risks No Fall Risks Impaired balance/gait No Fall Risks   Follow up Falls prevention discussed;Education provided;Falls evaluation completed Falls prevention discussed Falls prevention discussed Falls prevention discussed Falls evaluation completed    FALL RISK PREVENTION PERTAINING TO THE HOME:  Any stairs in or around the home? {YES/NO:21197} If so, are there any without handrails? {YES/NO:21197} Home free of loose throw rugs in walkways, pet beds, electrical cords, etc? {YES/NO:21197} Adequate lighting in your home to reduce risk of falls? {YES/NO:21197}  ASSISTIVE DEVICES UTILIZED TO PREVENT FALLS:  Life alert? {YES/NO:21197} Use of a cane, walker or w/c? {YES/NO:21197} Grab bars in the bathroom? {YES/NO:21197} Shower chair or bench in shower? {YES/NO:21197} Elevated toilet seat or a handicapped toilet? {YES/NO:21197}  TIMED UP AND GO:  Was the test performed? {YES/NO:21197}.  Length of time to ambulate 10 feet: *** sec.   {Appearance of ZOXW:9604540}  Cognitive Function:        08/14/2020    8:38 AM 07/28/2019    2:40 PM 05/19/2016    1:32 PM  6CIT Screen  What Year? 0 points 0 points 0 points  What month? 0 points 0 points 0 points  What time? 0 points 0 points 0 points  Count back from 20 0 points 0 points 0 points  Months in reverse 4 points 4 points 4 points  Repeat phrase 2 points 0 points 8 points  Total Score 6 points 4 points 12 points     Immunizations Immunization History  Administered Date(s) Administered   Janssen (J&J) SARS-COV-2 Vaccination 06/25/2019, 03/15/2020   Moderna Covid-19 Vaccine Bivalent Booster 50yrs & up 09/05/2020, 03/01/2021   Pneumococcal Conjugate-13 05/19/2016   Pneumococcal Polysaccharide-23 12/21/2014   Tdap 04/26/2012   Zoster, Live 04/26/2012    {TDAP status:2101805}  {Flu Vaccine status:2101806}  {Pneumococcal vaccine status:2101807}  {Covid-19 vaccine status:2101808}  Qualifies for Shingles Vaccine? {YES/NO:21197}  Zostavax completed {YES/NO:21197}  {Shingrix Completed?:2101804}  Screening Tests Health Maintenance  Topic Date Due   Zoster Vaccines- Shingrix (1 of 2) Never done   COVID-19 Vaccine (5 - 2023-24 season) 12/13/2021   DTaP/Tdap/Td (2 - Td or Tdap) 04/26/2022   Medicare Annual Wellness (AWV)  08/21/2022   INFLUENZA VACCINE  11/13/2022   Colonoscopy  09/28/2026   Pneumonia Vaccine 19+ Years old  Completed   Hepatitis C Screening  Completed   HPV VACCINES  Aged Out    Health Maintenance  Health Maintenance Due  Topic Date Due   Zoster Vaccines- Shingrix (1 of 2) Never done   COVID-19 Vaccine (5 - 2023-24 season) 12/13/2021   DTaP/Tdap/Td (2 - Td or Tdap) 04/26/2022   Medicare Annual Wellness (AWV)  08/21/2022    {Colorectal cancer screening:2101809}  Lung Cancer Screening: (Low Dose CT Chest recommended if Age 42-80 years, 30 pack-year currently smoking OR have quit w/in 15years.) {DOES NOT does:27190::"does not"} qualify.   Lung Cancer Screening Referral: ***  Additional Screening:  Hepatitis C Screening: {DOES NOT does:27190::"does not"} qualify; Completed ***  Vision Screening: Recommended annual ophthalmology exams for early detection of glaucoma and other disorders of the eye. Is the patient up to date with their annual eye exam?  {  YES/NO:21197} Who is the provider or what is the name of the office in which the patient attends annual eye exams?  *** If pt is not established with a provider, would they like to be referred to a provider to establish care? {YES/NO:21197}.   Dental Screening: Recommended annual dental exams for proper oral hygiene  Community Resource Referral / Chronic Care Management: CRR required this visit?  {YES/NO:21197}  CCM required this visit?  {YES/NO:21197}     Plan:     I have personally reviewed and noted the following in the patient's chart:   Medical and social history Use of alcohol, tobacco or illicit drugs  Current medications and supplements including opioid prescriptions. {Opioid Prescriptions:[phone]} Functional ability and status Nutritional status Physical activity Advanced directives List of other physicians Hospitalizations, surgeries, and ER visits in previous 12 months Vitals Screenings to include cognitive, depression, and falls Referrals and appointments  In addition, I have reviewed and discussed with patient certain preventive protocols, quality metrics, and best practice recommendations. A written personalized care plan for preventive services as well as general preventive health recommendations were provided to patient.     Sue Lush, LPN   04/19/1094   Nurse Notes: ***

## 2022-09-25 ENCOUNTER — Ambulatory Visit: Payer: Medicare HMO | Admitting: Family Medicine

## 2022-10-02 ENCOUNTER — Ambulatory Visit (INDEPENDENT_AMBULATORY_CARE_PROVIDER_SITE_OTHER): Payer: Medicare HMO

## 2022-10-02 VITALS — Ht 70.5 in | Wt 201.0 lb

## 2022-10-02 DIAGNOSIS — Z5941 Food insecurity: Secondary | ICD-10-CM | POA: Diagnosis not present

## 2022-10-02 DIAGNOSIS — Z0000 Encounter for general adult medical examination without abnormal findings: Secondary | ICD-10-CM

## 2022-10-02 NOTE — Progress Notes (Signed)
Subjective:   Lonnie Huber is a 77 y.o. male who presents for Medicare Annual/Subsequent preventive examination.  Visit Complete: Virtual  I connected with  Lonnie Huber on 10/02/22 by a audio enabled telemedicine application and verified that I am speaking with the correct person using two identifiers.  Patient Location: Home  Provider Location: Office/Clinic  I discussed the limitations of evaluation and management by telemedicine. The patient expressed understanding and agreed to proceed.  Patient Medicare AWV questionnaire was completed by the patient on (not done); I have confirmed that all information answered by patient is correct and no changes since this date.  Review of Systems        Objective:    Today's Vitals   10/02/22 1257  Weight: 201 lb (91.2 kg)  Height: 5' 10.5" (1.791 m)   Body mass index is 28.43 kg/m.     10/02/2022    1:11 PM 08/01/2022    5:04 PM 09/27/2021    7:55 AM 08/20/2021    3:21 PM 08/14/2020    8:33 AM 07/28/2019    2:30 PM 02/09/2018    8:15 AM  Advanced Directives  Does Patient Have a Medical Advance Directive? No No No No No No   Would patient like information on creating a medical advance directive?  No - Patient declined  No - Patient declined No - Patient declined No - Patient declined No - Patient declined    Current Medications (verified) Outpatient Encounter Medications as of 10/02/2022  Medication Sig   allopurinol (ZYLOPRIM) 100 MG tablet Take 1 tablet (100 mg total) by mouth daily.   amLODipine (NORVASC) 10 MG tablet TAKE 1 TABLET EVERY DAY   atorvastatin (LIPITOR) 40 MG tablet TAKE 1 TABLET EVERY DAY   losartan (COZAAR) 100 MG tablet TAKE 1 TABLET EVERY DAY   methocarbamol (ROBAXIN) 500 MG tablet Take 1 tablet (500 mg total) by mouth 4 (four) times daily.   Omega 3 1000 MG CAPS Take 3 capsules (3,000 mg total) by mouth daily.   predniSONE (DELTASONE) 50 MG tablet Take 1 tablet (50 mg total) by mouth daily with  breakfast.   No facility-administered encounter medications on file as of 10/02/2022.    Allergies (verified) Fluzone [influenza virus vaccine]   History: Past Medical History:  Diagnosis Date   Alcohol abuse 09/20/2014   Decreased libido 10/20/2007   Failure of erection 09/20/2014   Gout    Hyperlipidemia    Hypertension    Thyroid disease    Past Surgical History:  Procedure Laterality Date   COLONOSCOPY WITH PROPOFOL N/A 10/10/2016   Procedure: COLONOSCOPY WITH PROPOFOL;  Surgeon: Wyline Mood, MD;  Location: [facility] ENDOSCOPY;  Service: Endoscopy;  Laterality: N/A;   COLONOSCOPY WITH PROPOFOL N/A 09/27/2021   Procedure: COLONOSCOPY WITH PROPOFOL;  Surgeon: Wyline Mood, MD;  Location: [facility] ENDOSCOPY;  Service: Gastroenterology;  Laterality: N/A;   LUMBAR FUSION  10/12/2009   LUMBAR LAMINECTOMY  10/12/2009   REPLACEMENT TOTAL KNEE Right 08/2013   Family History  Problem Relation Age of Onset   Coronary artery disease Mother    Congestive Heart Failure Father    Coronary artery disease Brother    COPD Sister    Breast cancer Sister 45   Heart attack Cousin    Diabetes Cousin    Social History   Socioeconomic History   Marital status: Married    Spouse name: Not on file   Number of children: Not on file   Years of  education: Not on file   Highest education level: Not on file  Occupational History   Occupation: retired  Tobacco Use   Smoking status: Former    Types: Cigarettes    Quit date: 09/20/1978    Years since quitting: 44.0   Smokeless tobacco: Never  Vaping Use   Vaping Use: Never used  Substance and Sexual Activity   Alcohol use: Not Currently    Alcohol/week: 22.0 standard drinks of alcohol    Types: 12 Cans of beer, 10 Shots of liquor per week   Drug use: No   Sexual activity: Yes  Other Topics Concern   Not on file  Social History Narrative   Not on file   Social Determinants of Health   Financial Resource Strain: Medium Risk (10/02/2022)   Overall  Financial Resource Strain (CARDIA)    Difficulty of Paying Living Expenses: Somewhat hard  Food Insecurity: Food Insecurity Present (10/02/2022)   Hunger Vital Sign    Worried About Running Out of Food in the Last Year: Sometimes true    Ran Out of Food in the Last Year: Never true  Transportation Needs: No Transportation Needs (10/02/2022)   PRAPARE - Administrator, Civil Service (Medical): No    Lack of Transportation (Non-Medical): No  Physical Activity: Inactive (10/02/2022)   Exercise Vital Sign    Days of Exercise per Week: 0 days    Minutes of Exercise per Session: 0 min  Stress: No Stress Concern Present (10/02/2022)   [employer] of Occupational Health - Occupational Stress Questionnaire    Feeling of Stress : Not at all  Social Connections: Moderately Isolated (10/02/2022)   Social Connection and Isolation Panel [NHANES]    Frequency of Communication with Friends and Family: More than three times a week    Frequency of Social Gatherings with Friends and Family: Three times a week    Attends Religious Services: Never    Active Member of Clubs or Organizations: No    Attends Banker Meetings: Never    Marital Status: Married    Tobacco Counseling Counseling given: Not Answered  Clinical Intake:  Pre-visit preparation completed: No  Pain : No/denies pain   BMI - recorded: 28.43 Nutritional Status: BMI 25 -29 Overweight Nutritional Risks: None Diabetes: No  How often do you need to have someone help you when you read instructions, pamphlets, or other written materials from your doctor or pharmacy?: 1 - Never  Interpreter Needed?: No  Comments: lives with wife and nephew Information entered by :: B.Elizabethann Lackey,LPN   Activities of Daily Living    10/02/2022    1:12 PM 03/25/2022    9:53 AM  In your present state of health, do you have any difficulty performing the following activities:  Hearing? 1 0  Vision? 0 0  Difficulty  concentrating or making decisions?  0  Walking or climbing stairs? 0 1  Dressing or bathing? 0 0  Doing errands, shopping? 0 0  Preparing Food and eating ? N   Using the Toilet? N   In the past six months, have you accidently leaked urine? N   Do you have problems with loss of bowel control? N   Managing your Medications? N   Managing your Finances? N   Housekeeping or managing your Housekeeping? N     Patient Care Team: Danelle Berry, PA-C as PCP - General (Family Medicine) Morene Crocker, MD as Referring Physician (Neurology) Andee Poles, [facility] (Inactive) (Pharmacist)  Indicate any recent Medical Services you may have received from other than [facility] providers in the past year (date may be approximate).     Assessment:   This is a routine wellness examination for Alonzo.  Hearing/Vision screen Hearing Screening - Comments:: Adequate hearing Vision Screening - Comments:: Adequate vision after cataract surgery Dr Marti Sleigh  Dietary issues and exercise activities discussed:     Goals Addressed               This Visit's Progress     "I need to get my meds straight" (pt-stated)   On track     Current Barriers:  Knowledge deficit related to medication use and doses  Pharmacist Clinical Goal(s):  Over the next 14 days, patient will be 100% adherent to medications as prescribed as evidenced by [facility] report.   Interventions: Reviewed medications in dept, provided counseling on proper use, administration, and disposal of all medications Counseled on Medicare Advantage plan and the "Medicare Gap" Transfer atorvastatin prescription to [facility]  Patient Self Care Activities:  Take all medications as prescribed  *initial goal documentation          Depression Screen    10/02/2022    1:07 PM 03/25/2022    9:53 AM 09/03/2021    9:40 AM 08/20/2021    3:14 PM 03/05/2021    8:27 AM 09/04/2020    7:56 AM 08/14/2020    8:25 AM  PHQ 2/9 Scores  PHQ - 2  Score 0 0 1 0 0 0 2  PHQ- 9 Score  0 2  0  4    Fall Risk    10/02/2022    1:00 PM 03/25/2022    9:53 AM 09/03/2021    9:39 AM 08/20/2021    3:21 PM 03/05/2021    8:27 AM  Fall Risk   Falls in the past year? 0 0 0 0 0  Number falls in past yr: 0 0  0 0  Injury with Fall? 0 0  0 0  Risk for fall due to : Impaired balance/gait No Fall Risks No Fall Risks Impaired balance/gait No Fall Risks  Follow up Education provided;Falls prevention discussed Falls prevention discussed;Education provided;Falls evaluation completed Falls prevention discussed Falls prevention discussed Falls prevention discussed    MEDICARE RISK AT HOME:  Medicare Risk at Home - 10/02/22 1302     Any stairs in or around the home? No    If so, are there any without handrails? No    Home free of loose throw rugs in walkways, pet beds, electrical cords, etc? Yes    Adequate lighting in your home to reduce risk of falls? Yes    Life alert? No    Use of a cane, walker or w/c? No    Grab bars in the bathroom? Yes    Shower chair or bench in shower? No    Elevated toilet seat or a handicapped toilet? Yes             TIMED UP AND GO:  Was the test performed?  No    Cognitive Function:        10/02/2022    1:14 PM 08/14/2020    8:38 AM 07/28/2019    2:40 PM 05/19/2016    1:32 PM  6CIT Screen  What Year? 4 points 0 points 0 points 0 points  What month? 0 points 0 points 0 points 0 points  What time? 0 points 0 points 0 points  0 points  Count back from 20 0 points 0 points 0 points 0 points  Months in reverse 4 points 4 points 4 points 4 points  Repeat phrase 2 points 2 points 0 points 8 points  Total Score 10 points 6 points 4 points 12 points    Immunizations Immunization History  Administered Date(s) Administered   Janssen (J&J) SARS-COV-2 Vaccination 06/25/2019, 03/15/2020   Moderna Covid-19 Vaccine Bivalent Booster 45yrs & up 09/05/2020, 03/01/2021   Pneumococcal Conjugate-13 05/19/2016    Pneumococcal Polysaccharide-23 12/21/2014   Tdap 04/26/2012   Zoster, Live 04/26/2012    TDAP status: Up to date  Flu Vaccine status: Declined, Education has been provided regarding the importance of this vaccine but patient still declined. Advised may receive this vaccine at local pharmacy or Health Dept. Aware to provide a copy of the vaccination record if obtained from local pharmacy or Health Dept. Verbalized acceptance and understanding.  Pneumococcal vaccine status: Up to date  Covid-19 vaccine status: Completed vaccines  Qualifies for Shingles Vaccine? Yes   Zostavax completed No   Shingrix Completed?: No.    Education has been provided regarding the importance of this vaccine. Patient has been advised to call insurance company to determine out of pocket expense if they have not yet received this vaccine. Advised may also receive vaccine at local pharmacy or Health Dept. Verbalized acceptance and understanding.  Screening Tests Health Maintenance  Topic Date Due   Zoster Vaccines- Shingrix (1 of 2) Never done   COVID-19 Vaccine (5 - 2023-24 season) 12/13/2021   DTaP/Tdap/Td (2 - Td or Tdap) 04/26/2022   INFLUENZA VACCINE  11/13/2022   Medicare Annual Wellness (AWV)  10/02/2023   Colonoscopy  09/28/2026   Pneumonia Vaccine 83+ Years old  Completed   Hepatitis C Screening  Completed   HPV VACCINES  Aged Out    Health Maintenance  Health Maintenance Due  Topic Date Due   Zoster Vaccines- Shingrix (1 of 2) Never done   COVID-19 Vaccine (5 - 2023-24 season) 12/13/2021   DTaP/Tdap/Td (2 - Td or Tdap) 04/26/2022    Colorectal cancer screening: No longer required.   Lung Cancer Screening: (Low Dose CT Chest recommended if Age 55-80 years, 20 pack-year currently smoking OR have quit w/in 15years.) does not qualify.   Lung Cancer Screening Referral: no  Additional Screening:  Hepatitis C Screening: does not qualify; Completed yes  Vision Screening: Recommended annual  ophthalmology exams for early detection of glaucoma and other disorders of the eye. Is the patient up to date with their annual eye exam?  Yes  Who is the provider or what is the name of the office in which the patient attends annual eye exams? Dr Marti Sleigh If pt is not established with a provider, would they like to be referred to a provider to establish care? No .   Dental Screening: Recommended annual dental exams for proper oral hygiene  Diabetic Foot Exam: n/a  Community Resource Referral / Chronic Care Management: CRR required this visit?  Yes   CCM required this visit?  No   Plan:     I have personally reviewed and noted the following in the patient's chart:   Medical and social history Use of alcohol, tobacco or illicit drugs  Current medications and supplements including opioid prescriptions. Patient is not currently taking opioid prescriptions. Functional ability and status Nutritional status Physical activity Advanced directives List of other physicians Hospitalizations, surgeries, and ER visits in previous 12 months Vitals Screenings to  include cognitive, depression, and falls Referrals and appointments  In addition, I have reviewed and discussed with patient certain preventive protocols, quality metrics, and best practice recommendations. A written personalized care plan for preventive services as well as general preventive health recommendations were provided to patient.     Sue Lush, LPN   5/40/9811   After Visit Summary: (Declined) Due to this being a telephonic visit, with patients personalized plan was offered to patient but patient Declined AVS at this time   Nurse Notes: pt relays financially paying for basic things difficult and says he has food insecurities sometimes. He relays a Child psychotherapist was to come to his house a couple of years ago for assessment but never came. Pt relays he has had panic attacks when he has gone out driving somewhere and  forgets the way he is going.

## 2022-10-06 ENCOUNTER — Telehealth: Payer: Self-pay | Admitting: *Deleted

## 2022-10-06 NOTE — Progress Notes (Signed)
  Care Coordination   Note   10/06/2022 Name: Lonnie Huber MRN: [id number] DOB: December 12, 1945  [name] is a 77 y.o. year old male who sees Danelle Berry, [place] for primary care. I reached out to [employer] by phone today to offer care coordination services.  [name] was given information about Care Coordination services today including:   The Care Coordination services include support from the care team which includes your Nurse Coordinator, Clinical Social Worker, or Pharmacist.  The Care Coordination team is here to help remove barriers to the health concerns and goals most important to you. Care Coordination services are voluntary, and the patient may decline or stop services at any time by request to their care team member.   Care Coordination Consent Status: Patient agreed to services and verbal consent obtained.   Follow up plan:  Telephone appointment with care coordination team member scheduled for:  10/15/2022  Encounter Outcome:  Pt. Scheduled from referral   [name], [facility] Care Coordination Care Guide Direct Dial: [phone]

## 2022-10-13 ENCOUNTER — Ambulatory Visit: Payer: Medicare HMO | Admitting: Family Medicine

## 2022-10-15 ENCOUNTER — Ambulatory Visit: Payer: Self-pay | Admitting: *Deleted

## 2022-10-15 NOTE — Patient Outreach (Signed)
  Care Coordination   10/15/2022 Name: LAMARIO MU MRN: [id number] DOB: 27-Sep-1945   Care Coordination Outreach Attempts:  An unsuccessful telephone outreach was attempted for a scheduled appointment today.  Follow Up Plan:  Additional outreach attempts will be made to offer the patient care coordination information and services.   Encounter Outcome:  No Answer   Care Coordination Interventions:  No, not indicated    Dionicio Shelnutt, LCSW Clinical Social Worker  [facility] Care Management [phone]

## 2022-10-17 ENCOUNTER — Ambulatory Visit: Payer: Self-pay

## 2022-10-17 NOTE — Telephone Encounter (Signed)
No answer from pt left detailed vm. °

## 2022-10-17 NOTE — Telephone Encounter (Signed)
Chief Complaint: Fall Symptoms: Bilateral knee pain and hip pain 8 out of 10. Frequency: Larey Seat on Wednesday Pertinent Negatives: Patient denies dizziness, headache, or other injuries. Disposition: [] ED /[] Urgent Care (no appt availability in office) / [] Appointment(In office/virtual)/ []  [place] [facility]/ [] Home Care/ [x] Refused Recommended Disposition /[] [place] [facility]/ []  Follow-up with PCP Additional Notes: Patient reports falling after standing up from recliner chair on Wednesday. Patient reports sore knees and hip after fall. Patient reports staying in bed due to the knee pain, only getting up to use the bedside commode. Patient states he is not going to walk any further due to the knee pain.Patient canceled appointment with PCP scheduled for Monday and stated he will call back to rescheduled if he can get around better by Monday. Asked patient if anyone was available to help him get to the appointment. Patient stated his wife can drive him but he doesn't know if he will be ready to move around that much by Monday. Patient continues to refuse appointment at this time.  Reason for Disposition  MILD weakness (i.e., does not interfere with ability to work, go to school, normal activities)  (Exception: Mild weakness is a chronic symptom.)  Answer Assessment - Initial Assessment Questions 1. MECHANISM: "How did the fall happen?"     I fell after standing up 2. DOMESTIC VIOLENCE AND ELDER ABUSE SCREENING: "Did you fall because someone pushed you or tried to hurt you?" If Yes, ask: "Are you safe now?"     No 3. ONSET: "When did the fall happen?" (e.g., minutes, hours, or days ago)     Wednesday 4. LOCATION: "What part of the body hit the ground?" (e.g., back, buttocks, head, hips, knees, hands, head, stomach)     Hip and both knees 5. INJURY: "Did you hurt (injure) yourself when you fell?" If Yes, ask: "What did you injure? Tell me more about this?" (e.g., body area; type of injury;  pain severity)"     My knees are hurting 6. PAIN: "Is there any pain?" If Yes, ask: "How bad is the pain?" (e.g., Scale 1-10; or mild,  moderate, severe)   - NONE (0): No pain   - MILD (1-3): Doesn't interfere with normal activities    - MODERATE (4-7): Interferes with normal activities or awakens from sleep    - SEVERE (8-10): Excruciating pain, unable to do any normal activities      8 out of 10 7. SIZE: For cuts, bruises, or swelling, ask: "How large is it?" (e.g., inches or centimeters)      No      9. OTHER SYMPTOMS: "Do you have any other symptoms?" (e.g., dizziness, fever, weakness; new onset or worsening).      My knees are just sore 10. CAUSE: "What do you think caused the fall (or falling)?" (e.g., tripped, dizzy spell)       I'm not sure, I just stood up from my chair and fell.  Protocols used: Falls and [facility]

## 2022-10-20 ENCOUNTER — Ambulatory Visit: Payer: Medicare HMO | Admitting: Family Medicine

## 2022-10-24 ENCOUNTER — Ambulatory Visit: Payer: Self-pay | Admitting: *Deleted

## 2022-10-24 NOTE — Patient Instructions (Signed)
Visit Information  Thank you for taking time to visit with me today. Please don't hesitate to contact me if I can be of assistance to you.   Following are the goals we discussed today:  Please contact this social worker with any additional community resource needs   If you are experiencing a Mental Health or Behavioral Health Crisis or need someone to talk to, please call 911   The patient verbalized understanding of instructions, educational materials, and care plan provided today and DECLINED offer to receive copy of patient instructions, educational materials, and care plan.   No further follow up required: patient to contact this Child psychotherapist with any additional community resource or metal health needs  Laureles, LCSW Clinical Social Worker  [facility] Care Management [phone]

## 2022-10-24 NOTE — Patient Outreach (Signed)
  Care Coordination   Initial Visit Note   10/24/2022 Name: Lonnie Huber MRN: [id number] DOB: 1946-02-08  Lonnie Huber is a 77 y.o. year old male who sees Danelle Berry, [place] for primary care. I spoke with  Lonnie Huber by phone today.  What matters to the patients health and wellness today?  Needs assessment completed, patient states that he has no community resource needs at this time. Denies need for mental health resources at this time.    Goals Addressed             This Visit's Progress    care coordination activities       Interventions Today    Flowsheet Row Most Recent Value  Chronic Disease   Chronic disease during today's visit Hypertension (HTN), Other  [chronic pain]  General Interventions   General Interventions Discussed/Reviewed Doctor Visits  Doctor Visits Discussed/Reviewed Annual Wellness Visits  [10/08/23 AWV]  Mental Health Interventions   Mental Health Discussed/Reviewed Mental Health Discussed, Coping Strategies, Depression, Anxiety  [reports panic attacks "come and go" remains "calm and they pass" uses deep breathing excercises, declines ongoing mental health counseling, denies suicidal/homicidal thoughts]  Safety Interventions   Safety Discussed/Reviewed Safety Discussed  [encouraged to call the crisis line in the event of a mental health couseling]              SDOH assessments and interventions completed:  Yes  SDOH Interventions Today    Flowsheet Row Most Recent Value  SDOH Interventions   Food Insecurity Interventions Intervention Not Indicated  Housing Interventions Intervention Not Indicated  Transportation Interventions Intervention Not Indicated        Care Coordination Interventions:  Yes, provided   Follow up plan: No further intervention required.   Encounter Outcome:  Pt. Visit Completed

## 2022-11-05 ENCOUNTER — Encounter: Payer: Self-pay | Admitting: Family Medicine

## 2022-11-05 ENCOUNTER — Ambulatory Visit (INDEPENDENT_AMBULATORY_CARE_PROVIDER_SITE_OTHER): Payer: Medicare HMO | Admitting: Family Medicine

## 2022-11-05 ENCOUNTER — Ambulatory Visit: Payer: Medicare HMO | Admitting: Family Medicine

## 2022-11-05 VITALS — BP 124/80 | HR 100 | Temp 97.9°F | Resp 16 | Ht 72.0 in | Wt 195.1 lb

## 2022-11-05 DIAGNOSIS — I10 Essential (primary) hypertension: Secondary | ICD-10-CM

## 2022-11-05 DIAGNOSIS — E7800 Pure hypercholesterolemia, unspecified: Secondary | ICD-10-CM | POA: Diagnosis not present

## 2022-11-05 DIAGNOSIS — R7989 Other specified abnormal findings of blood chemistry: Secondary | ICD-10-CM | POA: Diagnosis not present

## 2022-11-05 DIAGNOSIS — N1831 Chronic kidney disease, stage 3a: Secondary | ICD-10-CM | POA: Insufficient documentation

## 2022-11-05 DIAGNOSIS — M109 Gout, unspecified: Secondary | ICD-10-CM | POA: Diagnosis not present

## 2022-11-05 DIAGNOSIS — G8929 Other chronic pain: Secondary | ICD-10-CM | POA: Diagnosis not present

## 2022-11-05 DIAGNOSIS — M25569 Pain in unspecified knee: Secondary | ICD-10-CM

## 2022-11-05 DIAGNOSIS — Z5181 Encounter for therapeutic drug level monitoring: Secondary | ICD-10-CM | POA: Diagnosis not present

## 2022-11-05 DIAGNOSIS — N182 Chronic kidney disease, stage 2 (mild): Secondary | ICD-10-CM

## 2022-11-05 MED ORDER — LOSARTAN POTASSIUM 100 MG PO TABS: 100.0000 mg | ORAL_TABLET | Freq: Every day | ORAL | 1 refills | Status: DC

## 2022-11-05 MED ORDER — ALLOPURINOL 100 MG PO TABS: 100.0000 mg | ORAL_TABLET | Freq: Every day | ORAL | 1 refills | Status: DC

## 2022-11-05 MED ORDER — ATORVASTATIN CALCIUM 40 MG PO TABS: 40.0000 mg | ORAL_TABLET | Freq: Every day | ORAL | 3 refills | Status: DC

## 2022-11-05 MED ORDER — AMLODIPINE BESYLATE 10 MG PO TABS: 10.0000 mg | ORAL_TABLET | Freq: Every day | ORAL | 1 refills | Status: DC

## 2022-11-05 NOTE — Assessment & Plan Note (Signed)
Labs - continue monitoring

## 2022-11-05 NOTE — Assessment & Plan Note (Signed)
BP at goal today, has been stable, well controlled on amlodipine and losartan  BP Readings from Last 3 Encounters:  11/05/22 124/80  08/01/22 126/88  03/25/22 130/74

## 2022-11-05 NOTE — Assessment & Plan Note (Signed)
Last labs in Dec, ldl was well controlled on atorvastatin 40 mg dose He is taking in the am Not due for labs - will recheck annually He reports good compliance and no SE or concerns

## 2022-11-05 NOTE — Assessment & Plan Note (Signed)
Recheck labs Discussed with pt concerning levels of AST/ALT, fatty liver disease, ETOH liver disease and monitoring

## 2022-11-05 NOTE — Progress Notes (Signed)
Name: LORIE MELICHAR   MRN: [id number]    DOB: [birth year]   Date:11/05/2022       Progress Note  Chief Complaint  Patient presents with   Follow-up   Hypertension   Hyperlipidemia     Subjective:   Lonnie Huber is a 77 y.o. male, presents to clinic for routine f/up  Pt out of some of his meds, he has cancelled appts or rescheduled - he is not sure what meds he is out of   Hyperlipidemia: Currently treated with atorvastatin 40 , pt reports good med compliance Last Lipids: Lab Results  Component Value Date   CHOL 130 03/25/2022   HDL 51 03/25/2022   LDLCALC 65 03/25/2022   TRIG 62 03/25/2022   CHOLHDL 2.5 03/25/2022   - Denies: Chest pain, shortness of breath, myalgias, claudication  Hypertension:  Currently managed on amlodipine and losartan Pt reports good med compliance and denies any SE.   Blood pressure today is well controlled. BP Readings from Last 3 Encounters:  11/05/22 124/80  08/01/22 126/88  03/25/22 130/74   Pt denies CP, SOB, exertional sx, LE edema, palpitation, Ha's, visual disturbances, lightheadedness, hypotension, syncope. Dietary efforts for BP?      Gout -  Uric acid level was well controlled and low, allopurinol dose decreased no gout flares recently  Chronic knee pain, - stil lmanagin doesn't want surgery but is established with ortho   Current Outpatient Medications:    allopurinol (ZYLOPRIM) 100 MG tablet, Take 1 tablet (100 mg total) by mouth daily., Disp: 90 tablet, Rfl: 2   amLODipine (NORVASC) 10 MG tablet, TAKE 1 TABLET EVERY DAY, Disp: 90 tablet, Rfl: 1   atorvastatin (LIPITOR) 40 MG tablet, TAKE 1 TABLET EVERY DAY, Disp: 90 tablet, Rfl: 3   losartan (COZAAR) 100 MG tablet, TAKE 1 TABLET EVERY DAY, Disp: 90 tablet, Rfl: 3   methocarbamol (ROBAXIN) 500 MG tablet, Take 1 tablet (500 mg total) by mouth 4 (four) times daily., Disp: 20 tablet, Rfl: 0   Omega 3 1000 MG CAPS, Take 3 capsules (3,000 mg total) by mouth daily., Disp: ,  Rfl:    predniSONE (DELTASONE) 50 MG tablet, Take 1 tablet (50 mg total) by mouth daily with breakfast., Disp: 5 tablet, Rfl: 0  Patient Active Problem List   Diagnosis Date Noted   Hyperlipidemia 04/24/2015   Chronic kidney disease (CKD), stage II (mild) 09/20/2014   Chronic low back pain 09/20/2014   Diverticulosis of colon 09/20/2014   Gout 09/20/2014   Chronic pain of right knee 09/20/2014   Nondependent alcohol abuse, episodic drinking behavior 07/10/2008   Benign essential HTN 06/18/2007    Past Surgical History:  Procedure Laterality Date   COLONOSCOPY WITH PROPOFOL N/A 10/10/2016   Procedure: COLONOSCOPY WITH PROPOFOL;  Surgeon: Wyline Mood, MD;  Location: [facility] ENDOSCOPY;  Service: Endoscopy;  Laterality: N/A;   COLONOSCOPY WITH PROPOFOL N/A 09/27/2021   Procedure: COLONOSCOPY WITH PROPOFOL;  Surgeon: Wyline Mood, MD;  Location: [facility] ENDOSCOPY;  Service: Gastroenterology;  Laterality: N/A;   LUMBAR FUSION  10/12/2009   LUMBAR LAMINECTOMY  10/12/2009   REPLACEMENT TOTAL KNEE Right 08/2013    Family History  Problem Relation Age of Onset   Coronary artery disease Mother    Congestive Heart Failure Father    Coronary artery disease Brother    COPD Sister    Breast cancer Sister 38   Heart attack Cousin    Diabetes Cousin     Social History  Tobacco Use   Smoking status: Former    Current packs/day: 0.00    Types: Cigarettes    Quit date: 09/20/1978    Years since quitting: 44.1   Smokeless tobacco: Never  Vaping Use   Vaping status: Never Used  Substance Use Topics   Alcohol use: Not Currently    Alcohol/week: 22.0 standard drinks of alcohol    Types: 12 Cans of beer, 10 Shots of liquor per week   Drug use: No     Allergies  Allergen Reactions   Fluzone [Influenza Virus Vaccine]     Health Maintenance  Topic Date Due   DTaP/Tdap/Td (2 - Td or Tdap) 04/26/2022   COVID-19 Vaccine (5 - 2023-24 season) 11/21/2022 (Originally 12/13/2021)   Zoster Vaccines-  Shingrix (1 of 2) 02/05/2023 (Originally 05/15/1997)   INFLUENZA VACCINE  11/13/2022   Medicare Annual Wellness (AWV)  10/02/2023   Colonoscopy  09/28/2026   Pneumonia Vaccine 17+ Years old  Completed   Hepatitis C Screening  Completed   HPV VACCINES  Aged Out    Chart Review Today: I personally reviewed active problem list, medication list, allergies, family history, social history, health maintenance, notes from last encounter, lab results, imaging with the patient/caregiver today.   Review of Systems  Constitutional: Negative.   HENT: Negative.    Eyes: Negative.   Respiratory: Negative.    Cardiovascular: Negative.   Gastrointestinal: Negative.   Endocrine: Negative.   Genitourinary: Negative.   Musculoskeletal: Negative.   Skin: Negative.   Allergic/Immunologic: Negative.   Neurological: Negative.   Hematological: Negative.   Psychiatric/Behavioral: Negative.    All other systems reviewed and are negative.    Objective:   Vitals:   11/05/22 1423 11/05/22 1441  BP: 124/80   Pulse: (!) 122 100  Resp: 16   Temp: 97.9 F (36.6 C)   TempSrc: Oral   SpO2: 97%   Weight: 195 lb 1.6 oz (88.5 kg)   Height: 6' (1.829 m)     Body mass index is 26.46 kg/m.  Physical Exam Vitals and nursing note reviewed.  Constitutional:      General: He is not in acute distress.    Appearance: Normal appearance. He is well-developed. He is not ill-appearing, toxic-appearing or diaphoretic.  HENT:     Head: Normocephalic and atraumatic.     Right Ear: External ear normal.     Left Ear: External ear normal.     Nose: Nose normal.  Eyes:     General:        Right eye: No discharge.        Left eye: No discharge.     Conjunctiva/sclera: Conjunctivae normal.  Neck:     Trachea: No tracheal deviation.  Cardiovascular:     Rate and Rhythm: Normal rate and regular rhythm.     Pulses: Normal pulses.     Heart sounds: Normal heart sounds. No murmur heard.    No gallop.  Pulmonary:      Effort: Pulmonary effort is normal. No respiratory distress.     Breath sounds: Normal breath sounds. No stridor. No wheezing, rhonchi or rales.  Musculoskeletal:        General: Swelling (bilateral knees) present.  Skin:    General: Skin is warm and dry.     Findings: No rash.  Neurological:     Mental Status: He is alert. Mental status is at baseline.     Motor: No abnormal muscle tone.     Coordination:  Coordination normal.     Gait: Gait abnormal (antalgic gait - using cane).  Psychiatric:        Mood and Affect: Mood normal.        Behavior: Behavior normal.        Assessment & Plan:   Problem List Items Addressed This Visit       Cardiovascular and Mediastinum   Benign essential HTN - Primary (Chronic)    BP at goal today, has been stable, well controlled on amlodipine and losartan  BP Readings from Last 3 Encounters:  11/05/22 124/80  08/01/22 126/88  03/25/22 130/74         Relevant Medications   amLODipine (NORVASC) 10 MG tablet   losartan (COZAAR) 100 MG tablet   atorvastatin (LIPITOR) 40 MG tablet   Other Relevant Orders   COMPLETE METABOLIC PANEL WITH GFR     Genitourinary   Chronic kidney disease (CKD), stage II (mild)    Labs - continue monitoring        Other   Hyperlipidemia (Chronic)    Last labs in Dec, ldl was well controlled on atorvastatin 40 mg dose He is taking in the am Not due for labs - will recheck annually He reports good compliance and no SE or concerns      Relevant Medications   amLODipine (NORVASC) 10 MG tablet   losartan (COZAAR) 100 MG tablet   atorvastatin (LIPITOR) 40 MG tablet   Other Relevant Orders   COMPLETE METABOLIC PANEL WITH GFR   Gout    No recent gout flares, last uric acid was low of normal range and allopurinol dose was reduced Recheck labs      Relevant Medications   allopurinol (ZYLOPRIM) 100 MG tablet   Other Relevant Orders   Uric acid   Elevated LFTs    Recheck labs Discussed with pt  concerning levels of AST/ALT, fatty liver disease, ETOH liver disease and monitoring      Other Visit Diagnoses     Medication monitoring encounter       Relevant Medications   allopurinol (ZYLOPRIM) 100 MG tablet   losartan (COZAAR) 100 MG tablet   atorvastatin (LIPITOR) 40 MG tablet   Other Relevant Orders   COMPLETE METABOLIC PANEL WITH GFR   Chronic knee pain, unspecified laterality       continued chronic pain, antalgic gait, pt still does not want to do surgery        Return in about 6 months (around 05/08/2023) for HTN HLD labs.   Danelle Berry, PA-C 11/05/22 2:54 PM

## 2022-11-05 NOTE — Assessment & Plan Note (Signed)
No recent gout flares, last uric acid was low of normal range and allopurinol dose was reduced Recheck labs

## 2022-11-06 LAB — COMPLETE METABOLIC PANEL WITH GFR
AG Ratio: 1.3 (calc) (ref 1.0–2.5)
ALT: 8 U/L — ABNORMAL LOW (ref 9–46)
AST: 20 U/L (ref 10–35)
Albumin: 4 g/dL (ref 3.6–5.1)
Alkaline phosphatase (APISO): 109 U/L (ref 35–144)
BUN: 8 mg/dL (ref 7–25)
CO2: 27 mmol/L (ref 20–32)
Calcium: 9.3 mg/dL (ref 8.6–10.3)
Chloride: 100 mmol/L (ref 98–110)
Creat: 1.14 mg/dL (ref 0.70–1.28)
Glucose, Bld: 109 mg/dL — ABNORMAL HIGH (ref 65–99)
Potassium: 3.9 mmol/L (ref 3.5–5.3)
Sodium: 136 mmol/L (ref 135–146)
Total Bilirubin: 1.4 mg/dL — ABNORMAL HIGH (ref 0.2–1.2)
Total Protein: 7.2 g/dL (ref 6.1–8.1)
eGFR: 67 mL/min/{1.73_m2} (ref >=60)

## 2022-11-06 LAB — URIC ACID: Uric Acid, Serum: 3.5 mg/dL — ABNORMAL LOW (ref 4.0–8.0)

## 2023-03-02 ENCOUNTER — Telehealth: Payer: Self-pay | Admitting: Family Medicine

## 2023-03-02 NOTE — Telephone Encounter (Signed)
Pt is aware that he have refills on file (allopurinol, amlopidpine, atorvastatin and losartan) but he is asking that we call it back in to his pharmacy. He tries to do it but his call will never go through. Please call [facility].

## 2023-03-02 NOTE — Telephone Encounter (Signed)
Called pharmacy and spoke to Ripon, she was bale to get the remaining refills out for patient will notify patient.

## 2023-04-23 ENCOUNTER — Ambulatory Visit: Payer: Medicare HMO | Admitting: Physician Assistant

## 2023-04-29 ENCOUNTER — Encounter: Payer: Self-pay | Admitting: Physician Assistant

## 2023-04-29 ENCOUNTER — Ambulatory Visit (INDEPENDENT_AMBULATORY_CARE_PROVIDER_SITE_OTHER): Payer: Medicare HMO | Admitting: Physician Assistant

## 2023-04-29 VITALS — BP 126/84 | HR 84 | Resp 16 | Ht 72.0 in | Wt 204.0 lb

## 2023-04-29 DIAGNOSIS — M109 Gout, unspecified: Secondary | ICD-10-CM | POA: Diagnosis not present

## 2023-04-29 DIAGNOSIS — I10 Essential (primary) hypertension: Secondary | ICD-10-CM | POA: Diagnosis not present

## 2023-04-29 DIAGNOSIS — R739 Hyperglycemia, unspecified: Secondary | ICD-10-CM

## 2023-04-29 DIAGNOSIS — E7800 Pure hypercholesterolemia, unspecified: Secondary | ICD-10-CM

## 2023-04-29 MED ORDER — LOSARTAN POTASSIUM 100 MG PO TABS: 100.0000 mg | ORAL_TABLET | Freq: Every day | ORAL | 1 refills | Status: DC

## 2023-04-29 MED ORDER — ALLOPURINOL 100 MG PO TABS: 100.0000 mg | ORAL_TABLET | Freq: Every day | ORAL | 1 refills | Status: DC

## 2023-04-29 MED ORDER — ATORVASTATIN CALCIUM 40 MG PO TABS: 40.0000 mg | ORAL_TABLET | Freq: Every day | ORAL | 1 refills | Status: DC

## 2023-04-29 MED ORDER — AMLODIPINE BESYLATE 10 MG PO TABS: 10.0000 mg | ORAL_TABLET | Freq: Every day | ORAL | 1 refills | Status: DC

## 2023-04-29 NOTE — Assessment & Plan Note (Signed)
 Chronic, stable He denies recent gout flares Appears to be tolerating Allopurinol  without concerns Recheck uric acid today for monitoring  Continue current regimen Follow up in 6 months or sooner if concerns arise

## 2023-04-29 NOTE — Assessment & Plan Note (Signed)
 Chronic, historic condition Appears stable and at goal in office today Currently taking Amlodipine  10 mg PO every day and Losartan  100 mg PO every day Continue current regimen Recommend monitoring at home a few times per week as tolerated  Follow up in 6 months or sooner if concerns arise

## 2023-04-29 NOTE — Assessment & Plan Note (Signed)
 Chronic, historic condition Appears to be tolerating current regimen without concerns Continue atorvastatin  40 mg PO every day  Recheck lipid panel today. Results to dictate further management  Follow up in 6 months or sooner if concerns arise

## 2023-04-29 NOTE — Progress Notes (Signed)
 Established Patient Office Visit  Name: Lonnie Huber   MRN: [id number]    DOB: [birth year]   Date:04/29/2023  Today's Provider: Glynis Lass, MHS, PA-C Introduced myself to the patient as a PA-C and provided education on APPs in clinical practice.         Subjective  Chief Complaint  Chief Complaint  Patient presents with   Medical Management of Chronic Issues    6 months   Hypertension    HPI  HYPERTENSION / HYPERLIPIDEMIA Satisfied with current treatment? yes Duration of hypertension: years BP monitoring frequency: not checking BP range:  BP medication side effects: no  BP meds: amlodipine  and losartan  (cozaar ) Duration of hyperlipidemia: years Cholesterol medication side effects: no Cholesterol supplements: fish oil (omega 3) supplement   cholesterol medications: atorvastain (lipitor) Medication compliance: good compliance Aspirin: no Recent stressors: no Recurrent headaches: no Visual changes: no Palpitations: no Dyspnea: no Chest pain: no Lower extremity edema: no Dizzy/lightheaded: no   Gout  Reports he has not had a gout flare recently  Has been taking Allopurinol  as directed without concerns    Patient Active Problem List   Diagnosis Date Noted   Stage 3a chronic kidney disease (HCC) 11/05/2022   Elevated LFTs 11/05/2022   Hyperlipidemia 04/24/2015   Chronic kidney disease (CKD), stage II (mild) 09/20/2014   Chronic low back pain 09/20/2014   Diverticulosis of colon 09/20/2014   Gout 09/20/2014   Chronic pain of right knee 09/20/2014   Nondependent alcohol abuse, episodic drinking behavior 07/10/2008   Benign essential HTN 06/18/2007    Past Surgical History:  Procedure Laterality Date   COLONOSCOPY WITH PROPOFOL  N/A 10/10/2016   Procedure: COLONOSCOPY WITH PROPOFOL ;  Surgeon: Luke Salaam, MD;  Location: [facility] ENDOSCOPY;  Service: Endoscopy;  Laterality: N/A;   COLONOSCOPY WITH PROPOFOL  N/A 09/27/2021   Procedure: COLONOSCOPY WITH  PROPOFOL ;  Surgeon: Luke Salaam, MD;  Location: [facility] ENDOSCOPY;  Service: Gastroenterology;  Laterality: N/A;   LUMBAR FUSION  10/12/2009   LUMBAR LAMINECTOMY  10/12/2009   REPLACEMENT TOTAL KNEE Right 08/2013    Family History  Problem Relation Age of Onset   Coronary artery disease Mother    Congestive Heart Failure Father    Coronary artery disease Brother    COPD Sister    Breast cancer Sister 81   Heart attack Cousin    Diabetes Cousin     Social History   Tobacco Use   Smoking status: Former    Current packs/day: 0.00    Types: Cigarettes    Quit date: 09/20/1978    Years since quitting: 44.6   Smokeless tobacco: Never  Substance Use Topics   Alcohol use: Not Currently    Alcohol/week: 22.0 standard drinks of alcohol    Types: 12 Cans of beer, 10 Shots of liquor per week     Current Outpatient Medications:    Omega 3 1000 MG CAPS, Take 3 capsules (3,000 mg total) by mouth daily., Disp: , Rfl:    allopurinol  (ZYLOPRIM ) 100 MG tablet, Take 1 tablet (100 mg total) by mouth daily., Disp: 90 tablet, Rfl: 1   amLODipine  (NORVASC ) 10 MG tablet, Take 1 tablet (10 mg total) by mouth daily., Disp: 90 tablet, Rfl: 1   atorvastatin  (LIPITOR) 40 MG tablet, Take 1 tablet (40 mg total) by mouth at bedtime., Disp: 90 tablet, Rfl: 1   losartan  (COZAAR ) 100 MG tablet, Take 1 tablet (100 mg total) by mouth daily., Disp:  90 tablet, Rfl: 1   methocarbamol  (ROBAXIN ) 500 MG tablet, Take 1 tablet (500 mg total) by mouth 4 (four) times daily. (Patient not taking: Reported on 04/29/2023), Disp: 20 tablet, Rfl: 0   predniSONE  (DELTASONE ) 50 MG tablet, Take 1 tablet (50 mg total) by mouth daily with breakfast. (Patient not taking: Reported on 04/29/2023), Disp: 5 tablet, Rfl: 0  Allergies  Allergen Reactions   Fluzone [Influenza Virus Vaccine]     I personally reviewed active problem list, medication list, allergies, health maintenance, notes from last encounter, lab results with the  patient/caregiver today.   ROS  See HPI for relevant ROS   Objective  Vitals:   04/29/23 0820  BP: 126/84  Pulse: 84  Resp: 16  SpO2: 98%  Weight: 204 lb (92.5 kg)  Height: 6' (1.829 m)    Body mass index is 27.67 kg/m.  Physical Exam Vitals reviewed.  Constitutional:      General: He is awake.     Appearance: Normal appearance. He is well-developed and well-groomed.  HENT:     Head: Normocephalic and atraumatic.  Cardiovascular:     Rate and Rhythm: Normal rate and regular rhythm.     Pulses: Normal pulses.          Radial pulses are 2+ on the right side and 2+ on the left side.     Heart sounds: Normal heart sounds. No murmur heard.    No friction rub. No gallop.  Pulmonary:     Effort: Pulmonary effort is normal.     Breath sounds: Normal breath sounds. No decreased air movement. No decreased breath sounds, wheezing, rhonchi or rales.  Musculoskeletal:     Cervical back: Normal range of motion.     Right lower leg: No edema.     Left lower leg: No edema.  Neurological:     General: No focal deficit present.     Mental Status: He is alert and oriented to person, place, and time. Mental status is at baseline.     GCS: GCS eye subscore is 4. GCS verbal subscore is 5. GCS motor subscore is 6.  Psychiatric:        Attention and Perception: Attention and perception normal.        Mood and Affect: Mood and affect normal.        Speech: Speech normal.        Behavior: Behavior normal. Behavior is cooperative.        Thought Content: Thought content normal.        Cognition and Memory: Cognition normal.      No results found for this or any previous visit (from the past 2160 hours).   PHQ2/9:    11/05/2022    2:23 PM 10/24/2022   10:22 AM 10/02/2022    1:07 PM 03/25/2022    9:53 AM 09/03/2021    9:40 AM  Depression screen PHQ 2/9  Decreased Interest 0 0 0 0 1  Down, Depressed, Hopeless 0 1 0 0 0  PHQ - 2 Score 0 1 0 0 1  Altered sleeping 0   0 0  Tired,  decreased energy 0   0 1  Change in appetite 0   0 0  Feeling bad or failure about yourself  0   0 0  Trouble concentrating 0   0 0  Moving slowly or fidgety/restless 0   0 0  Suicidal thoughts 0   0 0  PHQ-9 Score 0  0 2  Difficult doing work/chores Not difficult at all   Not difficult at all Not difficult at all      Fall Risk:    11/05/2022    2:23 PM 10/02/2022    1:00 PM 03/25/2022    9:53 AM 09/03/2021    9:39 AM 08/20/2021    3:21 PM  Fall Risk   Falls in the past year? 0 0 0 0 0  Number falls in past yr: 0 0 0  0  Injury with Fall? 0 0 0  0  Risk for fall due to : No Fall Risks Impaired balance/gait No Fall Risks No Fall Risks Impaired balance/gait  Follow up Falls prevention discussed;Education provided;Falls evaluation completed Education provided;Falls prevention discussed Falls prevention discussed;Education provided;Falls evaluation completed Falls prevention discussed Falls prevention discussed      Functional Status Survey:      Assessment & Plan  Problem List Items Addressed This Visit       Cardiovascular and Mediastinum   Benign essential HTN - Primary (Chronic)   Chronic, historic condition Appears stable and at goal in office today Currently taking Amlodipine  10 mg PO every day and Losartan  100 mg PO every day Continue current regimen Recommend monitoring at home a few times per week as tolerated  Follow up in 6 months or sooner if concerns arise        Relevant Medications   amLODipine  (NORVASC ) 10 MG tablet   atorvastatin  (LIPITOR) 40 MG tablet   losartan  (COZAAR ) 100 MG tablet   Other Relevant Orders   COMPLETE METABOLIC PANEL WITH GFR   CBC w/Diff/Platelet     Other   Hyperlipidemia (Chronic)   Chronic, historic condition Appears to be tolerating current regimen without concerns Continue atorvastatin  40 mg PO every day  Recheck lipid panel today. Results to dictate further management  Follow up in 6 months or sooner if concerns  arise        Relevant Medications   amLODipine  (NORVASC ) 10 MG tablet   atorvastatin  (LIPITOR) 40 MG tablet   losartan  (COZAAR ) 100 MG tablet   Other Relevant Orders   Lipid Profile   Gout   Chronic, stable He denies recent gout flares Appears to be tolerating Allopurinol  without concerns Recheck uric acid today for monitoring  Continue current regimen Follow up in 6 months or sooner if concerns arise        Relevant Medications   allopurinol  (ZYLOPRIM ) 100 MG tablet   Other Relevant Orders   Uric acid   Other Visit Diagnoses       Hyperglycemia       Relevant Orders   HgB A1c     Review of labs demonstrates elevated glucose levels for last 2 labs Will get A1c for DM screening  Results to dictate further management     Return in about 6 months (around 10/27/2023) for HLD, HTN, Annual Physical.   I, Jaxin Fulfer E Carra Brindley, PA-C, have reviewed all documentation for this visit. The documentation on 04/29/23 for the exam, diagnosis, procedures, and orders are all accurate and complete.   Glynis Lass, MHS, PA-C [facility]

## 2023-04-30 LAB — COMPLETE METABOLIC PANEL WITH GFR
AG Ratio: 1.2 (calc) (ref 1.0–2.5)
ALT: 19 U/L (ref 9–46)
AST: 25 U/L (ref 10–35)
Albumin: 3.9 g/dL (ref 3.6–5.1)
Alkaline phosphatase (APISO): 105 U/L (ref 35–144)
BUN: 9 mg/dL (ref 7–25)
CO2: 30 mmol/L (ref 20–32)
Calcium: 9.5 mg/dL (ref 8.6–10.3)
Chloride: 100 mmol/L (ref 98–110)
Creat: 1.19 mg/dL (ref 0.70–1.28)
Globulin: 3.3 g/dL (ref 1.9–3.7)
Glucose, Bld: 99 mg/dL (ref 65–99)
Potassium: 4.3 mmol/L (ref 3.5–5.3)
Sodium: 138 mmol/L (ref 135–146)
Total Bilirubin: 1.3 mg/dL — ABNORMAL HIGH (ref 0.2–1.2)
Total Protein: 7.2 g/dL (ref 6.1–8.1)
eGFR: 64 mL/min/{1.73_m2} (ref >=60)

## 2023-04-30 LAB — CBC WITH DIFFERENTIAL/PLATELET
Absolute Lymphocytes: 1628 {cells}/uL (ref 850–3900)
Absolute Monocytes: 578 {cells}/uL (ref 200–950)
Basophils Absolute: 30 {cells}/uL (ref 0–200)
Basophils Relative: 0.5 %
Eosinophils Absolute: 100 {cells}/uL (ref 15–500)
Eosinophils Relative: 1.7 %
HCT: 46.4 % (ref 38.5–50.0)
Hemoglobin: 15.5 g/dL (ref 13.2–17.1)
MCH: 29 pg (ref 27.0–33.0)
MCHC: 33.4 g/dL (ref 32.0–36.0)
MCV: 86.7 fL (ref 80.0–100.0)
MPV: 11.7 fL (ref 7.5–12.5)
Monocytes Relative: 9.8 %
Neutro Abs: 3564 {cells}/uL (ref 1500–7800)
Neutrophils Relative %: 60.4 %
Platelets: 192 10*3/uL (ref 140–400)
RBC: 5.35 10*6/uL (ref 4.20–5.80)
RDW: 12.8 % (ref 11.0–15.0)
Total Lymphocyte: 27.6 %
WBC: 5.9 10*3/uL (ref 3.8–10.8)

## 2023-04-30 LAB — LIPID PANEL
Cholesterol: 146 mg/dL (ref <200)
HDL: 74 mg/dL (ref >=40)
LDL Cholesterol (Calc): 57 mg/dL
Non-HDL Cholesterol (Calc): 72 mg/dL (ref <130)
Total CHOL/HDL Ratio: 2 (calc) (ref <5.0)
Triglycerides: 70 mg/dL (ref <150)

## 2023-04-30 LAB — HEMOGLOBIN A1C
Hgb A1c MFr Bld: 5.5 %{Hb} (ref <5.7)
Mean Plasma Glucose: 111 mg/dL
eAG (mmol/L): 6.2 mmol/L

## 2023-04-30 LAB — URIC ACID: Uric Acid, Serum: 4.7 mg/dL (ref 4.0–8.0)

## 2023-05-04 NOTE — Progress Notes (Signed)
Your labs are back. Your electrolytes, liver and kidney function were overall normal at this time Your CBC is overall normal- no signs of anemia Your cholesterol looks great Your A1c is 5.5% which is in normal range.  Your Uric Acid was normal. - we typically check this for concerns with gout. Please continue your medications as directed. Let us know if you have further questions or concerns.

## 2023-06-08 ENCOUNTER — Emergency Department
Admission: EM | Admit: 2023-06-08 | Discharge: 2023-06-08 | Disposition: A | Discharge status: Home / Self Care | Payer: Medicare HMO | Attending: Emergency Medicine | Admitting: Emergency Medicine

## 2023-06-08 ENCOUNTER — Emergency Department: Payer: Medicare HMO

## 2023-06-08 ENCOUNTER — Other Ambulatory Visit: Payer: Self-pay

## 2023-06-08 DIAGNOSIS — I959 Hypotension, unspecified: Secondary | ICD-10-CM | POA: Diagnosis not present

## 2023-06-08 DIAGNOSIS — E860 Dehydration: Secondary | ICD-10-CM | POA: Insufficient documentation

## 2023-06-08 DIAGNOSIS — N189 Chronic kidney disease, unspecified: Secondary | ICD-10-CM | POA: Insufficient documentation

## 2023-06-08 DIAGNOSIS — R55 Syncope and collapse: Secondary | ICD-10-CM | POA: Insufficient documentation

## 2023-06-08 LAB — CBC
HCT: 48.9 % (ref 39.0–52.0)
Hemoglobin: 16 g/dL (ref 13.0–17.0)
MCH: 29.1 pg (ref 26.0–34.0)
MCHC: 32.7 g/dL (ref 30.0–36.0)
MCV: 89.1 fL (ref 80.0–100.0)
Platelets: 196 10*3/uL (ref 150–400)
RBC: 5.49 MIL/uL (ref 4.22–5.81)
RDW: 14.2 % (ref 11.5–15.5)
WBC: 6.9 10*3/uL (ref 4.0–10.5)
nRBC: 0 % (ref 0.0–0.2)

## 2023-06-08 LAB — BASIC METABOLIC PANEL
Anion gap: 12 (ref 5–15)
BUN: 13 mg/dL (ref 8–23)
CO2: 21 mmol/L — ABNORMAL LOW (ref 22–32)
Calcium: 8.6 mg/dL — ABNORMAL LOW (ref 8.9–10.3)
Chloride: 103 mmol/L (ref 98–111)
Creatinine, Ser: 1.6 mg/dL — ABNORMAL HIGH (ref 0.61–1.24)
GFR, Estimated: 44 mL/min — ABNORMAL LOW (ref >60)
Glucose, Bld: 100 mg/dL — ABNORMAL HIGH (ref 70–99)
Potassium: 3.7 mmol/L (ref 3.5–5.1)
Sodium: 136 mmol/L (ref 135–145)

## 2023-06-08 MED ORDER — SODIUM CHLORIDE 0.9 % IV BOLUS
500.0000 mL | Freq: Once | INTRAVENOUS | Status: AC
  Administered 2023-06-08: 500 mL via INTRAVENOUS

## 2023-06-08 NOTE — ED Triage Notes (Signed)
 Pt arrives to ED from grandsons basketball game via [facility] with c/c of syncopal episode. Pt was at grandson's basketball game and had a syncopal episode while standing up in the stands. Pt denies hitting head or losing consciousness. EMS reports transport vitals of initial BP 88/60, P90, R 15, O2 Sat 99%. BP improved after 500cc NS given IV. Repeat BP 102/60. At time of assessment, Pt A&Ox4, NAD.

## 2023-06-08 NOTE — ED Provider Notes (Signed)
 EKG inter by me at 2020 heart rate 85 QRS 80 QTc 440 Normal sinus rhythm occasional PAC.  No evidence of acute ischemia.  Mild nonspecific T wave flattening versus slight artifact   Sharyn Creamer, MD 06/08/23 2019

## 2023-06-08 NOTE — ED Provider Notes (Signed)
 [facility] Provider Note    Event Date/Time   First MD Initiated Contact with Patient 06/08/23 2037     (approximate)   History   Loss of Consciousness   HPI  Lonnie Huber is a 78 y.o. male with past medical history of chronic kidney disease hyperlipidemia transaminitis   The patient has been in normal health.  He was at his grandsons basketball game, after the game ended he stood up and stands and when he did he started feeling very lightheaded.  He did not fall or pass out but he felt sort of hazy though he was going to pass out, so he laid down.  He did not pass out and a few other people assisted him to his feet.  EMS was called and found he was hypotensive.  Patient had no chest pain no nausea or vomiting no numbness or weakness.  Just felt like he was very faint.  He reports this has actually happened to him several times in the past, but today seem to be the worst.  It is happening many times over the last several years  He does report that he does not drink a lot of water, usually only has a few sips of water daily with his medications.  He does have chronic kidney disease  Additionally he recently lost his sister, she died at the hospital today  No chest pain, no trouble breathing, no skipped beats.  Feels fine and back to normal now  Physical Exam   Triage Vital Signs: ED Triage Vitals  Encounter Vitals Group     BP 06/08/23 2017 108/75     Systolic BP Percentile --      Diastolic BP Percentile --      Pulse Rate 06/08/23 2017 82     Resp 06/08/23 2017 18     Temp 06/08/23 2017 97.6 F (36.4 C)     Temp Source 06/08/23 2017 Oral     SpO2 06/08/23 2017 98 %     Weight 06/08/23 2019 206 lb (93.4 kg)     Height 06/08/23 2019 5\' 11"  (1.803 m)     Head Circumference --      Peak Flow --      Pain Score 06/08/23 2019 0     Pain Loc --      Pain Education --      Exclude from Growth Chart --     Most recent vital signs: Vitals:    06/08/23 2017  BP: 108/75  Pulse: 82  Resp: 18  Temp: 97.6 F (36.4 C)  SpO2: 98%     General: Awake, no distress.  Fully awake alert.  Family at bedside.  Patient reports no ongoing symptoms.  Reports he feels perfectly fine now CV:  Good peripheral perfusion.  Normal tones and rate Resp:  Normal effort.  Question if he might have slightly reduced lung sounds over the right compared to the left chest though no distress, no wheezing, no dense decrease in lung sounds over the right chest. Abd:  No distention.  Soft nontender nondistended Other:  Moves all extremities well.  Fully awake and alert.  Mucous membranes slightly dry.   ED Results / Procedures / Treatments   Labs (all labs ordered are listed, but only abnormal results are displayed) Labs Reviewed  BASIC METABOLIC PANEL - Abnormal; Notable for the following components:      Result Value   CO2 21 (*)  Glucose, Bld 100 (*)    Creatinine, Ser 1.60 (*)    Calcium 8.6 (*)    GFR, Estimated 44 (*)    All other components within normal limits  CBC     EKG   EKG inter by me at 2020 heart rate 85 QRS 80 QTc 440 Normal sinus rhythm occasional PAC.  No evidence of acute ischemia.  Mild nonspecific T wave flattening versus slight artifact         RADIOLOGY  Chest x-ray interpreted by me as large hiatal hernia, chronic on Right  DG Chest 2 View Result Date: 06/08/2023 CLINICAL DATA:  Syncope.  Decreased lung sounds on the right. EXAM: CHEST - 2 VIEW COMPARISON:  08/10/2013 FINDINGS: Right posterior diaphragmatic hernia containing gas-filled bowel. This is similar to prior study. Heart size and pulmonary vascularity are normal. Lungs are clear. No pleural effusion or pneumothorax. Mediastinal contours appear intact. IMPRESSION: No active cardiopulmonary disease. Electronically Signed   By: [name] M.D.   On: 06/08/2023 22:40      PROCEDURES:  Critical Care performed: No  Procedures   MEDICATIONS  ORDERED IN ED: Medications  sodium chloride 0.9 % bolus 500 mL (500 mLs Intravenous New Bag/Given 06/08/23 2158)     IMPRESSION / MDM / ASSESSMENT AND PLAN / ED COURSE  I reviewed the triage vital signs and the nursing notes.                              Differential diagnosis includes, but is not limited to, syncope possibly of an orthostatic type, vasovagal, less likely considered would be causes such as , murmur, cardiac dysrhythmia, acute anemia, electrolyte abnormality etc.  Very reassuring examination his hypotension has resolved after IV fluids with EMS and he reports symptoms occurred while standing without having true syncope.  Very reassuring reevaluation at this time.  Currently asymptomatic.  Will check CBC metabolic panel.  No chest pain no cardiac symptoms to suggest ACS or PE  Patient's presentation is most consistent with acute complicated illness / injury requiring diagnostic workup.      Clinical Course as of 06/08/23 2316  Mon Jun 08, 2023  2228 Normal CBC [MQ]  2228 Patient resting comfortably without distress or further symptomatology. [MQ]  2315 Laboratory studies suggestive of very mild elevation in patient's baseline creatinine.  This is a suspect this is most likely prerenal in nature, no associated acute intra-abdominal pain no chest pain or shortness of breath.  No recent illness or systemic symptoms.  He is well-hydrated now feeling improved resting comfortably asymptomatic this time.  Agreeable with plan for discharge careful return precautions [MQ]  2316 Return precautions and treatment recommendations and follow-up discussed with the patient who is agreeable with the plan.  [MQ]    Clinical Course User Index [MQ] Sharyn Creamer, MD     FINAL CLINICAL IMPRESSION(S) / ED DIAGNOSES   Final diagnoses:  Near syncope  Dehydration, mild     Rx / DC Orders   ED Discharge Orders     None        Note:  This document was prepared using Dragon voice  recognition software and may include unintentional dictation errors.   Sharyn Creamer, MD 06/08/23 [phone]

## 2023-10-20 ENCOUNTER — Other Ambulatory Visit: Payer: Self-pay | Admitting: Family Medicine

## 2023-10-20 DIAGNOSIS — M109 Gout, unspecified: Secondary | ICD-10-CM

## 2023-10-20 DIAGNOSIS — I10 Essential (primary) hypertension: Secondary | ICD-10-CM

## 2023-10-20 DIAGNOSIS — E7800 Pure hypercholesterolemia, unspecified: Secondary | ICD-10-CM

## 2023-10-20 NOTE — Telephone Encounter (Unsigned)
 Copied from CRM [id number]. Topic: Clinical - Medication Refill >> Oct 20, 2023  9:19 AM Silvana PARAS wrote: Medication: allopurinol  (ZYLOPRIM ) 100 MG tablet, amLODipine  (NORVASC ) 10 MG tablet, atorvastatin  (LIPITOR) 40 MG tablet, methocarbamol  (ROBAXIN ) 500 MG tablet.   Has the patient contacted their pharmacy? Yes (Agent: If no, request that the patient contact the pharmacy for the refill. If patient does not wish to contact the pharmacy document the reason why and proceed with request.) (Agent: If yes, when and what did the pharmacy advise?)  This is the patient's preferred pharmacy:  [facility] [place], [place] - [address] [place] [address] Phone: [phone] Fax: [phone]  Is this the correct pharmacy for this prescription? Yes If no, delete pharmacy and type the correct one.   Has the prescription been filled recently? Yes  Is the patient out of the medication? Yes  Has the patient been seen for an appointment in the last year OR does the patient have an upcoming appointment? Yes  Can we respond through MyChart? No  Agent: Please be advised that Rx refills may take up to 3 business days. We ask that you follow-up with your pharmacy.

## 2023-10-22 MED ORDER — ATORVASTATIN CALCIUM 40 MG PO TABS: 40.0000 mg | ORAL_TABLET | Freq: Every day | ORAL | 0 refills | Status: DC

## 2023-10-22 MED ORDER — AMLODIPINE BESYLATE 10 MG PO TABS: 10.0000 mg | ORAL_TABLET | Freq: Every day | ORAL | 0 refills | Status: DC

## 2023-10-22 MED ORDER — ALLOPURINOL 100 MG PO TABS: 100.0000 mg | ORAL_TABLET | Freq: Every day | ORAL | 0 refills | Status: DC

## 2023-10-22 NOTE — Telephone Encounter (Signed)
 Requested medication (s) are due for refill today: yes  Requested medication (s) are on the active medication list: yes  Last refill:  08/01/22  Future visit scheduled: yes  Notes to clinic:  Unable to refill per protocol, cannot delegate.      Requested Prescriptions  Pending Prescriptions Disp Refills   methocarbamol  (ROBAXIN ) 500 MG tablet 20 tablet 0    Sig: Take 1 tablet (500 mg total) by mouth 4 (four) times daily.     Not Delegated - Analgesics:  Muscle Relaxants Failed - 10/22/2023 12:34 PM      Failed - This refill cannot be delegated      Failed - Valid encounter within last 6 months    Recent Outpatient Visits   None     Future Appointments             In 5 days Tapia, [name], PA-C [place] [facility]            Signed Prescriptions Disp Refills   allopurinol  (ZYLOPRIM ) 100 MG tablet 90 tablet 0    Sig: Take 1 tablet (100 mg total) by mouth daily.     Endocrinology:  Gout Agents - allopurinol  Failed - 10/22/2023 12:34 PM      Failed - Cr in normal range and within 360 days    Creat  Date Value Ref Range Status  04/29/2023 1.19 0.70 - 1.28 mg/dL Final   Creatinine, Ser  Date Value Ref Range Status  06/08/2023 1.60 (H) 0.61 - 1.24 mg/dL Final         Failed - Valid encounter within last 12 months    Recent Outpatient Visits   None     Future Appointments             In 5 days Leavy Mole, PA-C [place] [facility]            Passed - Uric Acid in normal range and within 360 days    Uric Acid, Serum  Date Value Ref Range Status  04/29/2023 4.7 4.0 - 8.0 mg/dL Final    Comment:    Therapeutic target for gout patients: <6.0 mg/dL .    Uric Acid  Date Value Ref Range Status  04/24/2015 3.1 (L) 3.7 - 8.6 mg/dL Final    Comment:               Therapeutic target for gout patients: <6.0         Passed - CBC within normal limits and completed in the last 12 months    WBC  Date Value Ref  Range Status  06/08/2023 6.9 4.0 - 10.5 K/uL Final   RBC  Date Value Ref Range Status  06/08/2023 5.49 4.22 - 5.81 MIL/uL Final   Hemoglobin  Date Value Ref Range Status  06/08/2023 16.0 13.0 - 17.0 g/dL Final   HGB  Date Value Ref Range Status  08/31/2013 9.4 (L) 13.0 - 18.0 g/dL Final   HCT  Date Value Ref Range Status  06/08/2023 48.9 39.0 - 52.0 % Final  08/31/2013 28.0 (L) 40.0 - 52.0 % Final   MCHC  Date Value Ref Range Status  06/08/2023 32.7 30.0 - 36.0 g/dL Final   [facility]  Date Value Ref Range Status  06/08/2023 29.1 26.0 - 34.0 pg Final   MCV  Date Value Ref Range Status  06/08/2023 89.1 80.0 - 100.0 fL Final  08/31/2013 93 80 - 100 fL Final  No results found for: PLTCOUNTKUC, LABPLAT, POCPLA RDW  Date Value Ref Range Status  06/08/2023 14.2 11.5 - 15.5 % Final  08/31/2013 15.2 (H) 11.5 - 14.5 % Final          amLODipine  (NORVASC ) 10 MG tablet 90 tablet 0    Sig: Take 1 tablet (10 mg total) by mouth daily.     Cardiovascular: Calcium  Channel Blockers 2 Failed - 10/22/2023 12:34 PM      Failed - Valid encounter within last 6 months    Recent Outpatient Visits   None     Future Appointments             In 5 days Leavy Mole, PA-C [place]            Passed - Last BP in normal range    BP Readings from Last 1 Encounters:  06/08/23 108/75         Passed - Last Heart Rate in normal range    Pulse Readings from Last 1 Encounters:  06/08/23 82          atorvastatin  (LIPITOR) 40 MG tablet 90 tablet 0    Sig: Take 1 tablet (40 mg total) by mouth at bedtime.     Cardiovascular:  Antilipid - Statins Failed - 10/22/2023 12:34 PM      Failed - Valid encounter within last 12 months    Recent Outpatient Visits   None     Future Appointments             In 5 days Tapia, [name], PA-C [facility]            Failed - Lipid Panel in normal range within the last 12  months    Cholesterol, Total  Date Value Ref Range Status  04/24/2015 182 100 - 199 mg/dL Final   Cholesterol  Date Value Ref Range Status  04/29/2023 146 <200 mg/dL Final   LDL Cholesterol (Calc)  Date Value Ref Range Status  04/29/2023 57 mg/dL (calc) Final    Comment:    Reference range: <100 . Desirable range <100 mg/dL for primary prevention;   <70 mg/dL for patients with CHD or diabetic patients  with > or = 2 CHD risk factors. SABRA LDL-C is now calculated using the Martin-Hopkins  calculation, which is a validated novel method providing  better accuracy than the Friedewald equation in the  estimation of LDL-C.  Gladis APPLETHWAITE et al. SANDREA. 7986;689(80): 2061-2068  (http://education.[facility].com/faq/FAQ164)    HDL  Date Value Ref Range Status  04/29/2023 74 > OR = 40 mg/dL Final  98/89/7982 58 >60 mg/dL Final   Triglycerides  Date Value Ref Range Status  04/29/2023 70 <150 mg/dL Final         Passed - Patient is not pregnant

## 2023-10-22 NOTE — Telephone Encounter (Signed)
 Requested Prescriptions  Pending Prescriptions Disp Refills   allopurinol  (ZYLOPRIM ) 100 MG tablet 90 tablet 0    Sig: Take 1 tablet (100 mg total) by mouth daily.     Endocrinology:  Gout Agents - allopurinol  Failed - 10/22/2023 12:32 PM      Failed - Cr in normal range and within 360 days    Creat  Date Value Ref Range Status  04/29/2023 1.19 0.70 - 1.28 mg/dL Final   Creatinine, Ser  Date Value Ref Range Status  06/08/2023 1.60 (H) 0.61 - 1.24 mg/dL Final         Failed - Valid encounter within last 12 months    Recent Outpatient Visits   None     Future Appointments             In 5 days Leavy Mole, PA-C [place] [facility]            Passed - Uric Acid in normal range and within 360 days    Uric Acid, Serum  Date Value Ref Range Status  04/29/2023 4.7 4.0 - 8.0 mg/dL Final    Comment:    Therapeutic target for gout patients: <6.0 mg/dL .    Uric Acid  Date Value Ref Range Status  04/24/2015 3.1 (L) 3.7 - 8.6 mg/dL Final    Comment:               Therapeutic target for gout patients: <6.0         Passed - CBC within normal limits and completed in the last 12 months    WBC  Date Value Ref Range Status  06/08/2023 6.9 4.0 - 10.5 K/uL Final   RBC  Date Value Ref Range Status  06/08/2023 5.49 4.22 - 5.81 MIL/uL Final   Hemoglobin  Date Value Ref Range Status  06/08/2023 16.0 13.0 - 17.0 g/dL Final   HGB  Date Value Ref Range Status  08/31/2013 9.4 (L) 13.0 - 18.0 g/dL Final   HCT  Date Value Ref Range Status  06/08/2023 48.9 39.0 - 52.0 % Final  08/31/2013 28.0 (L) 40.0 - 52.0 % Final   MCHC  Date Value Ref Range Status  06/08/2023 32.7 30.0 - 36.0 g/dL Final   [facility]  Date Value Ref Range Status  06/08/2023 29.1 26.0 - 34.0 pg Final   MCV  Date Value Ref Range Status  06/08/2023 89.1 80.0 - 100.0 fL Final  08/31/2013 93 80 - 100 fL Final   No results found for: PLTCOUNTKUC, LABPLAT, POCPLA RDW  Date Value  Ref Range Status  06/08/2023 14.2 11.5 - 15.5 % Final  08/31/2013 15.2 (H) 11.5 - 14.5 % Final          amLODipine  (NORVASC ) 10 MG tablet 90 tablet 0    Sig: Take 1 tablet (10 mg total) by mouth daily.     Cardiovascular: Calcium  Channel Blockers 2 Failed - 10/22/2023 12:32 PM      Failed - Valid encounter within last 6 months    Recent Outpatient Visits   None     Future Appointments             In 5 days Leavy Mole, PA-C [place] [facility]            Passed - Last BP in normal range    BP Readings from Last 1 Encounters:  06/08/23 108/75         Passed - Last  Heart Rate in normal range    Pulse Readings from Last 1 Encounters:  06/08/23 82          atorvastatin  (LIPITOR) 40 MG tablet 90 tablet 0    Sig: Take 1 tablet (40 mg total) by mouth at bedtime.     Cardiovascular:  Antilipid - Statins Failed - 10/22/2023 12:32 PM      Failed - Valid encounter within last 12 months    Recent Outpatient Visits   None     Future Appointments             In 5 days Tapia, [name], PA-C [place]            Failed - Lipid Panel in normal range within the last 12 months    Cholesterol, Total  Date Value Ref Range Status  04/24/2015 182 100 - 199 mg/dL Final   Cholesterol  Date Value Ref Range Status  04/29/2023 146 <200 mg/dL Final   LDL Cholesterol (Calc)  Date Value Ref Range Status  04/29/2023 57 mg/dL (calc) Final    Comment:    Reference range: <100 . Desirable range <100 mg/dL for primary prevention;   <70 mg/dL for patients with CHD or diabetic patients  with > or = 2 CHD risk factors. SABRA LDL-C is now calculated using the Martin-Hopkins  calculation, which is a validated novel method providing  better accuracy than the Friedewald equation in the  estimation of LDL-C.  Gladis APPLETHWAITE et al. SANDREA. 7986;689(80): 2061-2068  (http://education.[facility].com/faq/FAQ164)    HDL  Date Value  Ref Range Status  04/29/2023 74 > OR = 40 mg/dL Final  98/89/7982 58 >60 mg/dL Final   Triglycerides  Date Value Ref Range Status  04/29/2023 70 <150 mg/dL Final         Passed - Patient is not pregnant       methocarbamol  (ROBAXIN ) 500 MG tablet 20 tablet 0    Sig: Take 1 tablet (500 mg total) by mouth 4 (four) times daily.     Not Delegated - Analgesics:  Muscle Relaxants Failed - 10/22/2023 12:32 PM      Failed - This refill cannot be delegated      Failed - Valid encounter within last 6 months    Recent Outpatient Visits   None     Future Appointments             In 5 days Tapia, [name], PA-C [facility], [place], [facility]

## 2023-10-27 ENCOUNTER — Encounter: Payer: Self-pay | Admitting: Family Medicine

## 2023-10-27 ENCOUNTER — Ambulatory Visit (INDEPENDENT_AMBULATORY_CARE_PROVIDER_SITE_OTHER): Payer: Self-pay | Admitting: Family Medicine

## 2023-10-27 VITALS — BP 128/70 | HR 84 | Resp 16 | Ht 71.0 in | Wt 188.0 lb

## 2023-10-27 DIAGNOSIS — I10 Essential (primary) hypertension: Secondary | ICD-10-CM | POA: Diagnosis not present

## 2023-10-27 DIAGNOSIS — M109 Gout, unspecified: Secondary | ICD-10-CM

## 2023-10-27 DIAGNOSIS — E7800 Pure hypercholesterolemia, unspecified: Secondary | ICD-10-CM

## 2023-10-27 DIAGNOSIS — R739 Hyperglycemia, unspecified: Secondary | ICD-10-CM | POA: Diagnosis not present

## 2023-10-27 DIAGNOSIS — I493 Ventricular premature depolarization: Secondary | ICD-10-CM

## 2023-10-27 LAB — COMPREHENSIVE METABOLIC PANEL WITH GFR
AG Ratio: 1.3 (calc) (ref 1.0–2.5)
ALT: 17 U/L (ref 9–46)
AST: 24 U/L (ref 10–35)
Albumin: 4.2 g/dL (ref 3.6–5.1)
Alkaline phosphatase (APISO): 91 U/L (ref 35–144)
BUN: 8 mg/dL (ref 7–25)
CO2: 28 mmol/L (ref 20–32)
Calcium: 9.9 mg/dL (ref 8.6–10.3)
Chloride: 94 mmol/L — ABNORMAL LOW (ref 98–110)
Creat: 1.17 mg/dL (ref 0.70–1.28)
Globulin: 3.2 g/dL (ref 1.9–3.7)
Glucose, Bld: 109 mg/dL — ABNORMAL HIGH (ref 65–99)
Potassium: 4.2 mmol/L (ref 3.5–5.3)
Sodium: 131 mmol/L — ABNORMAL LOW (ref 135–146)
Total Bilirubin: 1.4 mg/dL — ABNORMAL HIGH (ref 0.2–1.2)
Total Protein: 7.4 g/dL (ref 6.1–8.1)
eGFR: 65 mL/min/1.73m2 (ref >=60)

## 2023-10-27 MED ORDER — LOSARTAN POTASSIUM 100 MG PO TABS: 100.0000 mg | ORAL_TABLET | Freq: Every day | ORAL | 1 refills | Status: DC

## 2023-10-27 NOTE — Assessment & Plan Note (Signed)
 On statin atorvastatin  40 mg daily with poor diet/lifestyle  Good med compliance Labs done 6 months ago and well controlled Will continue to monitor annualy and watching LFTS Lab Results  Component Value Date   CHOL 146 04/29/2023   HDL 74 04/29/2023   LDLCALC 57 04/29/2023   TRIG 70 04/29/2023   CHOLHDL 2.0 04/29/2023

## 2023-10-27 NOTE — Progress Notes (Unsigned)
 Name: Lonnie Huber   MRN: [id number]    DOB: [birth year]   Date:10/27/2023       Progress Note  Chief Complaint  Patient presents with   Medical Management of Chronic Issues   Hyperlipidemia   Hypertension   Depression    Has had a lot of family deaths and feeling depressed- does not want any counseling at this time.      Subjective:   Lonnie Huber is a 78 y.o. male, presents to clinic for routine follow up on chronic conditions  Handicap - application needs  HTN  -  BP Readings from Last 3 Encounters:  10/27/23 128/70  06/08/23 108/75  04/29/23 126/84   Lipids done last OV, good statin compliance Lab Results  Component Value Date   CHOL 146 04/29/2023   HDL 74 04/29/2023   LDLCALC 57 04/29/2023   TRIG 70 04/29/2023   CHOLHDL 2.0 04/29/2023   Multiple deaths in family in the past 2-6 months    10/27/2023    8:46 AM 11/05/2022    2:23 PM 10/24/2022   10:22 AM  Depression screen PHQ 2/9  Decreased Interest 2 0 0  Down, Depressed, Hopeless 3 0 1  PHQ - 2 Score 5 0 1  Altered sleeping 1 0   Tired, decreased energy 0 0   Change in appetite 0 0   Feeling bad or failure about yourself  3 0   Trouble concentrating 0 0   Moving slowly or fidgety/restless 0 0   Suicidal thoughts 3 0   PHQ-9 Score 12 0   Difficult doing work/chores  Not difficult at all    ETOH drinking more   Discussed the use of AI scribe software for clinical note transcription with the patient, who gave verbal consent to proceed.  History of Present Illness Lonnie Huber is a 78 year old male with hypertension, hyperlipidemia, and chronic kidney disease who presents for a six-month routine follow-up.  Lightheadedness and decreased physical activity - Occasional lightheadedness, particularly with positional changes such as standing up quickly - Decreased physical activity, spending most of his time at home  Alcohol use and psychosocial stressors - History of alcohol abuse -  Increased alcohol consumption, especially when financially able - Uses alcohol as a coping mechanism following the recent loss of six family members in the past two months - Significant psychosocial stress related to bereavement  Hypertension management - Hypertension managed with losartan  and amlodipine  - Blood pressure perceived as well-controlled  Hyperlipidemia management - Hyperlipidemia managed with atorvastatin  40 mg  Chronic kidney disease monitoring - Chronic kidney disease, here for recheck of labs to monitor kidney function and liver enzymes  Gout management - On allopurinol  for gout - No current gout flares  Musculoskeletal pain - Chronic back and knee pain, no significant issues currently - No significant back pain at present  General symptoms and review of systems - No trouble breathing when lying down - No itching - No reported symptoms of jaundice or fluid retention     Current Outpatient Medications:    allopurinol  (ZYLOPRIM ) 100 MG tablet, Take 1 tablet (100 mg total) by mouth daily., Disp: 90 tablet, Rfl: 0   amLODipine  (NORVASC ) 10 MG tablet, Take 1 tablet (10 mg total) by mouth daily., Disp: 90 tablet, Rfl: 0   atorvastatin  (LIPITOR) 40 MG tablet, Take 1 tablet (40 mg total) by mouth at bedtime., Disp: 90 tablet, Rfl: 0   losartan  (COZAAR ) 100 MG tablet,  Take 1 tablet (100 mg total) by mouth daily., Disp: 90 tablet, Rfl: 1   Omega 3 1000 MG CAPS, Take 3 capsules (3,000 mg total) by mouth daily., Disp: , Rfl:   Patient Active Problem List   Diagnosis Date Noted   Stage 3a chronic kidney disease (HCC) 11/05/2022   Elevated LFTs 11/05/2022   Hyperlipidemia 04/24/2015   Chronic kidney disease (CKD), stage II (mild) 09/20/2014   Chronic low back pain 09/20/2014   Diverticulosis of colon 09/20/2014   Gout 09/20/2014   Chronic pain of right knee 09/20/2014   Nondependent alcohol abuse, episodic drinking behavior 07/10/2008   Benign essential HTN  06/18/2007    Past Surgical History:  Procedure Laterality Date   BACK SURGERY     COLONOSCOPY WITH PROPOFOL  N/A 10/10/2016   Procedure: COLONOSCOPY WITH PROPOFOL ;  Surgeon: Therisa Bi, MD;  Location: [facility] ENDOSCOPY;  Service: Endoscopy;  Laterality: N/A;   COLONOSCOPY WITH PROPOFOL  N/A 09/27/2021   Procedure: COLONOSCOPY WITH PROPOFOL ;  Surgeon: Therisa Bi, MD;  Location: [facility] ENDOSCOPY;  Service: Gastroenterology;  Laterality: N/A;   JOINT REPLACEMENT     LUMBAR FUSION  10/12/2009   LUMBAR LAMINECTOMY  10/12/2009   REPLACEMENT TOTAL KNEE Right 08/12/2013    Family History  Problem Relation Age of Onset   Coronary artery disease Mother    Congestive Heart Failure Father    Coronary artery disease Brother    COPD Sister    Breast cancer Sister 28   Heart attack Cousin    Diabetes Cousin     Social History   Tobacco Use   Smoking status: Former    Current packs/day: 0.00    Types: Cigarettes    Quit date: 09/20/1978    Years since quitting: 45.1   Smokeless tobacco: Never  Vaping Use   Vaping status: Never Used  Substance Use Topics   Alcohol use: Not Currently    Alcohol/week: 50.0 standard drinks of alcohol    Types: 50 Glasses of wine per week   Drug use: No     Allergies  Allergen Reactions   Fluzone [Influenza Virus Vaccine]     Health Maintenance  Topic Date Due   Medicare Annual Wellness (AWV)  10/02/2023   COVID-19 Vaccine (6 - 2024-25 season) 11/12/2023 (Originally 12/14/2022)   Zoster Vaccines- Shingrix (1 of 2) 01/27/2024 (Originally 05/15/1997)   DTaP/Tdap/Td (2 - Td or Tdap) 10/26/2024 (Originally 04/26/2022)   INFLUENZA VACCINE  11/13/2023   Colonoscopy  09/28/2026   Pneumococcal Vaccine: 50+ Years  Completed   Hepatitis C Screening  Completed   Hepatitis B Vaccines  Aged Out   HPV VACCINES  Aged Out   Meningococcal B Vaccine  Aged Out    Chart Review Today: I personally reviewed active problem list, medication list, allergies, family history,  social history, health maintenance, notes from last encounter, lab results, imaging with the patient/caregiver today.   Review of Systems  Constitutional: Negative.   HENT: Negative.    Eyes: Negative.   Respiratory: Negative.    Cardiovascular: Negative.   Gastrointestinal: Negative.   Endocrine: Negative.   Genitourinary: Negative.   Musculoskeletal: Negative.   Skin: Negative.   Allergic/Immunologic: Negative.   Neurological: Negative.   Hematological: Negative.   Psychiatric/Behavioral: Negative.    All other systems reviewed and are negative.    Objective:   Vitals:   10/27/23 0841  BP: 128/70  Pulse: 84  Resp: 16  SpO2: 99%  Weight: 188 lb (85.3 kg)  Height:  5' 11 (1.803 m)    Body mass index is 26.22 kg/m.  Physical Exam Vitals and nursing note reviewed.  Constitutional:      General: He is not in acute distress.    Appearance: Normal appearance. He is well-developed. He is not ill-appearing, toxic-appearing or diaphoretic.  HENT:     Head: Normocephalic and atraumatic.     Right Ear: External ear normal.     Left Ear: External ear normal.     Nose: Nose normal.  Eyes:     General: No scleral icterus.       Right eye: No discharge.        Left eye: No discharge.     Conjunctiva/sclera: Conjunctivae normal.  Neck:     Trachea: No tracheal deviation.  Cardiovascular:     Rate and Rhythm: Normal rate. Rhythm irregular.     Pulses: Normal pulses.     Heart sounds: No murmur heard.    No gallop.  Pulmonary:     Effort: Pulmonary effort is normal. No respiratory distress.     Breath sounds: Normal breath sounds. No stridor. No wheezing, rhonchi or rales.  Musculoskeletal:     Right lower leg: Edema present.     Left lower leg: Edema present.  Skin:    General: Skin is warm and dry.     Findings: No rash.  Neurological:     Mental Status: He is alert.     Motor: No abnormal muscle tone.     Coordination: Coordination normal.     Gait: Gait  abnormal (antalgic).  Psychiatric:        Mood and Affect: Mood normal.        Behavior: Behavior normal.        Results for orders placed or performed during the hospital encounter of 06/08/23  CBC   Collection Time: 06/08/23  9:56 PM  Result Value Ref Range   WBC 6.9 4.0 - 10.5 K/uL   RBC 5.49 4.22 - 5.81 MIL/uL   Hemoglobin 16.0 13.0 - 17.0 g/dL   HCT 51.0 60.9 - 47.9 %   MCV 89.1 80.0 - 100.0 fL   MCH 29.1 26.0 - 34.0 pg   MCHC 32.7 30.0 - 36.0 g/dL   RDW 85.7 88.4 - 84.4 %   Platelets 196 150 - 400 K/uL   nRBC 0.0 0.0 - 0.2 %  Basic metabolic panel   Collection Time: 06/08/23  9:56 PM  Result Value Ref Range   Sodium 136 135 - 145 mmol/L   Potassium 3.7 3.5 - 5.1 mmol/L   Chloride 103 98 - 111 mmol/L   CO2 21 (L) 22 - 32 mmol/L   Glucose, Bld 100 (H) 70 - 99 mg/dL   BUN 13 8 - 23 mg/dL   Creatinine, Ser 8.39 (H) 0.61 - 1.24 mg/dL   Calcium  8.6 (L) 8.9 - 10.3 mg/dL   GFR, Estimated 44 (L) >60 mL/min   Anion gap 12 5 - 15   ECG interpretation   Date: 10/27/23  Rate: ***  Rhythm: normal sinus rhythm  QRS Axis: normal  Intervals: normal  ST/T Wave abnormalities: normal  Conduction Disutrbances: none  Narrative Interpretation:   Old EKG Reviewed: ***No significant changes noted     Assessment & Plan:   Assessment and Plan Assessment & Plan Hypertension Blood pressure is well-controlled with current antihypertensive regimen. Occasional lightheadedness, likely due to postural changes. - Continue Losartan  100 mg daily and Amlodipine  10 mg daily -  Advise to rise slowly from sitting or lying positions to prevent lightheadedness  Elevated liver function tests Elevated liver enzymes likely related to alcohol use. Discussed potential liver damage with continued alcohol consumption and symptoms of advanced liver disease such as jaundice, fluid retention, and pruritus. - Monitor liver function tests - Advise on reducing alcohol intake to prevent further liver  damage  Nondependent alcohol abuse, episodic drinking behavior Increased alcohol consumption, particularly in social settings and during stress. Discussed impact on liver health and potential for liver damage. Emphasized importance of reducing intake to prevent long-term damage. - Advise on reducing alcohol intake - Offer referral to counseling services for support with alcohol use and coping with recent losses  Chronic kidney disease, stage II Chronic kidney disease requires monitoring to prevent progression. - Check kidney function tests  Hyperlipidemia Hyperlipidemia is managed with Atorvastatin  40 mg daily. - Continue Atorvastatin  40 mg daily  Gout Gout is managed with Allopurinol  100 mg daily. - Continue Allopurinol  100 mg daily  Chronic low back pain Chronic low back pain is present.  Stress due to recent family losses Experiencing significant stress due to recent family losses. Discussed potential benefits of counseling for emotional support and availability of social work resources. - Offer referral to Child psychotherapist for assistance with resources and support - Discuss potential benefits of counseling for emotional support  Irregular heart rate and lightheadedness Requires follow-up for EKG to assess heart rhythm due to irregular heart rate and lightheadedness. Previous ER visit showed no significant abnormalities, but further evaluation is necessary to rule out arrhythmias. - Perform EKG to assess heart rhythm  Recording duration: 12 minutes     Pure hypercholesterolemia  Benign essential HTN  Gout, unspecified cause, unspecified chronicity, unspecified site  Hyperglycemia   Premature beats, no palpitations, some near syncope and ED visit Feb for syncope, feels lightheaded with position    Return in about 4 months (around 02/27/2024) for routine f/up, MDD, SDOH needs, HTN, ETOH .   Michelene Cower, PA-C 10/27/23 9:01 AM

## 2023-10-29 ENCOUNTER — Ambulatory Visit: Payer: Self-pay | Admitting: Family Medicine

## 2023-11-18 NOTE — Addendum Note (Signed)
 Addended by: YVONE WARREN BROCKS on: 11/18/2023 11:32 AM   Modules accepted: Orders

## 2023-12-27 DIAGNOSIS — M8580 Other specified disorders of bone density and structure, unspecified site: Secondary | ICD-10-CM | POA: Diagnosis not present

## 2023-12-27 DIAGNOSIS — E785 Hyperlipidemia, unspecified: Secondary | ICD-10-CM | POA: Diagnosis not present

## 2023-12-27 DIAGNOSIS — N529 Male erectile dysfunction, unspecified: Secondary | ICD-10-CM | POA: Diagnosis not present

## 2023-12-27 DIAGNOSIS — I951 Orthostatic hypotension: Secondary | ICD-10-CM | POA: Diagnosis not present

## 2023-12-27 DIAGNOSIS — F1020 Alcohol dependence, uncomplicated: Secondary | ICD-10-CM | POA: Diagnosis not present

## 2023-12-27 DIAGNOSIS — N1832 Chronic kidney disease, stage 3b: Secondary | ICD-10-CM | POA: Diagnosis not present

## 2023-12-27 DIAGNOSIS — R2681 Unsteadiness on feet: Secondary | ICD-10-CM | POA: Diagnosis not present

## 2023-12-27 DIAGNOSIS — Z6828 Body mass index (BMI) 28.0-28.9, adult: Secondary | ICD-10-CM | POA: Diagnosis not present

## 2023-12-27 DIAGNOSIS — Z91199 Patient's noncompliance with other medical treatment and regimen due to unspecified reason: Secondary | ICD-10-CM | POA: Diagnosis not present

## 2023-12-27 DIAGNOSIS — E663 Overweight: Secondary | ICD-10-CM | POA: Diagnosis not present

## 2023-12-27 DIAGNOSIS — I129 Hypertensive chronic kidney disease with stage 1 through stage 4 chronic kidney disease, or unspecified chronic kidney disease: Secondary | ICD-10-CM | POA: Diagnosis not present

## 2023-12-27 DIAGNOSIS — M1990 Unspecified osteoarthritis, unspecified site: Secondary | ICD-10-CM | POA: Diagnosis not present

## 2023-12-27 DIAGNOSIS — M109 Gout, unspecified: Secondary | ICD-10-CM | POA: Diagnosis not present

## 2023-12-27 DIAGNOSIS — H269 Unspecified cataract: Secondary | ICD-10-CM | POA: Diagnosis not present

## 2024-02-29 ENCOUNTER — Ambulatory Visit: Admitting: Family Medicine

## 2024-02-29 ENCOUNTER — Ambulatory Visit: Admitting: Nurse Practitioner

## 2024-03-16 ENCOUNTER — Encounter: Payer: Self-pay | Admitting: Nurse Practitioner

## 2024-03-16 ENCOUNTER — Ambulatory Visit: Admitting: Nurse Practitioner

## 2024-03-16 VITALS — BP 136/78 | HR 94 | Temp 98.1°F | Resp 18 | Ht 71.0 in | Wt 208.0 lb

## 2024-03-16 DIAGNOSIS — I10 Essential (primary) hypertension: Secondary | ICD-10-CM

## 2024-03-16 DIAGNOSIS — M5442 Lumbago with sciatica, left side: Secondary | ICD-10-CM | POA: Diagnosis not present

## 2024-03-16 DIAGNOSIS — E7800 Pure hypercholesterolemia, unspecified: Secondary | ICD-10-CM

## 2024-03-16 DIAGNOSIS — M1A0710 Idiopathic chronic gout, right ankle and foot, without tophus (tophi): Secondary | ICD-10-CM

## 2024-03-16 DIAGNOSIS — F1010 Alcohol abuse, uncomplicated: Secondary | ICD-10-CM

## 2024-03-16 DIAGNOSIS — N1831 Chronic kidney disease, stage 3a: Secondary | ICD-10-CM | POA: Diagnosis not present

## 2024-03-16 DIAGNOSIS — M5441 Lumbago with sciatica, right side: Secondary | ICD-10-CM

## 2024-03-16 DIAGNOSIS — G8929 Other chronic pain: Secondary | ICD-10-CM | POA: Diagnosis not present

## 2024-03-16 DIAGNOSIS — M25561 Pain in right knee: Secondary | ICD-10-CM

## 2024-03-16 DIAGNOSIS — M109 Gout, unspecified: Secondary | ICD-10-CM

## 2024-03-16 LAB — CBC WITH DIFFERENTIAL/PLATELET
Absolute Lymphocytes: 2126 {cells}/uL (ref 850–3900)
Absolute Monocytes: 553 {cells}/uL (ref 200–950)
Basophils Absolute: 20 {cells}/uL (ref 0–200)
Basophils Relative: 0.3 %
Eosinophils Absolute: 260 {cells}/uL (ref 15–500)
Eosinophils Relative: 4 %
HCT: 49.3 % (ref 39.4–51.1)
Hemoglobin: 16.5 g/dL (ref 13.2–17.1)
MCH: 30 pg (ref 27.0–33.0)
MCHC: 33.5 g/dL (ref 31.6–35.4)
MCV: 89.6 fL (ref 81.4–101.7)
MPV: 11.5 fL (ref 7.5–12.5)
Monocytes Relative: 8.5 %
Neutro Abs: 3543 {cells}/uL (ref 1500–7800)
Neutrophils Relative %: 54.5 %
Platelets: 203 Thousand/uL (ref 140–400)
RBC: 5.5 Million/uL (ref 4.20–5.80)
RDW: 12.7 % (ref 11.0–15.0)
Total Lymphocyte: 32.7 %
WBC: 6.5 Thousand/uL (ref 3.8–10.8)

## 2024-03-16 LAB — COMPREHENSIVE METABOLIC PANEL WITH GFR
AG Ratio: 1.5 (calc) (ref 1.0–2.5)
ALT: 14 U/L (ref 9–46)
AST: 19 U/L (ref 10–35)
Albumin: 4.2 g/dL (ref 3.6–5.1)
Alkaline phosphatase (APISO): 83 U/L (ref 35–144)
BUN/Creatinine Ratio: 13 (calc) (ref 6–22)
BUN: 17 mg/dL (ref 7–25)
CO2: 27 mmol/L (ref 20–32)
Calcium: 9.7 mg/dL (ref 8.6–10.3)
Chloride: 99 mmol/L (ref 98–110)
Creat: 1.35 mg/dL — ABNORMAL HIGH (ref 0.70–1.28)
Globulin: 2.8 g/dL (ref 1.9–3.7)
Glucose, Bld: 102 mg/dL — ABNORMAL HIGH (ref 65–99)
Potassium: 4.2 mmol/L (ref 3.5–5.3)
Sodium: 135 mmol/L (ref 135–146)
Total Bilirubin: 1.6 mg/dL — ABNORMAL HIGH (ref 0.2–1.2)
Total Protein: 7 g/dL (ref 6.1–8.1)
eGFR: 54 mL/min/1.73m2 — ABNORMAL LOW (ref >=60)

## 2024-03-16 LAB — LIPID PANEL
Cholesterol: 129 mg/dL (ref <200)
HDL: 49 mg/dL (ref >=40)
LDL Cholesterol (Calc): 63 mg/dL
Non-HDL Cholesterol (Calc): 80 mg/dL (ref <130)
Total CHOL/HDL Ratio: 2.6 (calc) (ref <5.0)
Triglycerides: 85 mg/dL (ref <150)

## 2024-03-16 MED ORDER — ALLOPURINOL 100 MG PO TABS: 100.0000 mg | ORAL_TABLET | Freq: Every day | ORAL | 1 refills | Status: AC

## 2024-03-16 MED ORDER — ATORVASTATIN CALCIUM 40 MG PO TABS: 40.0000 mg | ORAL_TABLET | Freq: Every day | ORAL | 1 refills | Status: AC

## 2024-03-16 MED ORDER — AMLODIPINE BESYLATE 10 MG PO TABS: 10.0000 mg | ORAL_TABLET | Freq: Every day | ORAL | 1 refills | Status: AC

## 2024-03-16 MED ORDER — LOSARTAN POTASSIUM 100 MG PO TABS: 100.0000 mg | ORAL_TABLET | Freq: Every day | ORAL | 1 refills | Status: AC

## 2024-03-16 NOTE — Progress Notes (Signed)
 BP (!) 156/74   Pulse 94   Temp 98.1 F (36.7 C)   Resp 18   Ht 5' 11 (1.803 m)   Wt 208 lb (94.3 kg)   SpO2 96%   BMI 29.01 kg/m    Subjective:    Patient ID: Lonnie Huber, male    DOB: [birth year], 78 y.o.   MRN: [id number]  HPI: [email or web address] Huber is a 78 y.o. male  Chief Complaint  Patient presents with   Medical Management of Chronic Issues   Medication Refill   Discussed the use of AI scribe software for clinical note transcription with the patient, who gave verbal consent to proceed.  History of Present Illness ELIMELECH Huber is a 79 year old male who presents for a four month follow-up.  Hypertension - Blood pressure today is 156/75 mmHg - Takes amlodipine  10 mg daily and losartan  100 mg daily as prescribed - Does not monitor blood pressure at home BP Readings from Last 3 Encounters:  03/16/24 136/78  10/27/23 128/70  06/08/23 108/75    Hyperlipidemia- continue atorvastatin  40 mg daily Lipid Panel     Component Value Date/Time   CHOL 146 04/29/2023 0902   CHOL 182 04/24/2015 1148   TRIG 70 04/29/2023 0902   HDL 74 04/29/2023 0902   HDL 58 04/24/2015 1148   CHOLHDL 2.0 04/29/2023 0902   VLDL 9 08/19/2016 0912   LDLCALC 57 04/29/2023 0902   LABVLDL 13 04/24/2015 1148     Gout - Currently asymptomatic - Takes allopurinol  100 mg daily as prescribed - Remains well when adherent to medication regimen  Chronic musculoskeletal pain - Chronic low back pain and knee pain - requesting handicap sticker   Mood and anxiety symptoms - Previous depression screening was elevated due to multiple family deaths - Current depression screening is negative - Anxiety screening is negative  Alcohol use disorder - History of alcohol abuse -has cut back on drinking.          03/16/2024    8:59 AM 10/27/2023    8:46 AM 11/05/2022    2:23 PM  Depression screen PHQ 2/9  Decreased Interest 0 2 0  Down, Depressed, Hopeless 0 3 0  PHQ - 2 Score 0 5 0   Altered sleeping 0 1 0  Tired, decreased energy 0 0 0  Change in appetite 0 0 0  Feeling bad or failure about yourself  0 3 0  Trouble concentrating 0 0 0  Moving slowly or fidgety/restless 0 0 0  Suicidal thoughts 0 3 0  PHQ-9 Score 0 12  0   Difficult doing work/chores Not difficult at all  Not difficult at all     Data saved with a previous flowsheet row definition       03/16/2024    8:58 AM 09/03/2021    9:40 AM  GAD 7 : Generalized Anxiety Score  Nervous, Anxious, on Edge 0 1  Control/stop worrying 0 0  Worry too much - different things 0 0  Trouble relaxing 0 1  Restless 0 0  Easily annoyed or irritable 0 1  Afraid - awful might happen 0 0  Total GAD 7 Score 0 3  Anxiety Difficulty Not difficult at all Not difficult at all     Relevant past medical, surgical, family and social history reviewed and updated as indicated. Interim medical history since our last visit reviewed. Allergies and medications reviewed and updated.  Review of Systems  Constitutional: Negative  for fever or weight change.  Respiratory: Negative for cough and shortness of breath.   Cardiovascular: Negative for chest pain or palpitations.  Gastrointestinal: Negative for abdominal pain, no bowel changes.  Musculoskeletal: Negative for gait problem or joint swelling.  Skin: Negative for rash.  Neurological: Negative for dizziness or headache.  No other specific complaints in a complete review of systems (except as listed in HPI above).      Objective:      BP (!) 156/74   Pulse 94   Temp 98.1 F (36.7 C)   Resp 18   Ht 5' 11 (1.803 m)   Wt 208 lb (94.3 kg)   SpO2 96%   BMI 29.01 kg/m    Wt Readings from Last 3 Encounters:  03/16/24 208 lb (94.3 kg)  10/27/23 188 lb (85.3 kg)  06/08/23 206 lb (93.4 kg)    Physical Exam VITALS: BP- 156/75 GENERAL: Alert, cooperative, well developed, no acute distress HEENT: Normocephalic, normal oropharynx, moist mucous membranes CHEST: Clear to  auscultation bilaterally, no wheezes, rhonchi, or crackles CARDIOVASCULAR: Normal heart rate and rhythm, S1 and S2 normal without murmurs ABDOMEN: Soft, non-tender, non-distended, without organomegaly, normal bowel sounds EXTREMITIES: No cyanosis or edema NEUROLOGICAL: Cranial nerves grossly intact, moves all extremities without gross motor or sensory deficit  Results for orders placed or performed in visit on 10/27/23  Comprehensive Metabolic Panel (CMET)   Collection Time: 10/27/23  9:22 AM  Result Value Ref Range   Glucose, Bld 109 (H) 65 - 99 mg/dL   BUN 8 7 - 25 mg/dL   Creat 8.82 9.29 - 8.71 mg/dL   eGFR 65 > OR = 60 fO/fpw/8.26f7   BUN/Creatinine Ratio SEE NOTE: 6 - 22 (calc)   Sodium 131 (L) 135 - 146 mmol/L   Potassium 4.2 3.5 - 5.3 mmol/L   Chloride 94 (L) 98 - 110 mmol/L   CO2 28 20 - 32 mmol/L   Calcium  9.9 8.6 - 10.3 mg/dL   Total Protein 7.4 6.1 - 8.1 g/dL   Albumin 4.2 3.6 - 5.1 g/dL   Globulin 3.2 1.9 - 3.7 g/dL (calc)   AG Ratio 1.3 1.0 - 2.5 (calc)   Total Bilirubin 1.4 (H) 0.2 - 1.2 mg/dL   Alkaline phosphatase (APISO) 91 35 - 144 U/L   AST 24 10 - 35 U/L   ALT 17 9 - 46 U/L          Assessment & Plan:   Problem List Items Addressed This Visit       Cardiovascular and Mediastinum   Benign essential HTN - Primary (Chronic)     Genitourinary   Stage 3a chronic kidney disease (HCC)     Other   Hyperlipidemia (Chronic)   Nondependent alcohol abuse, episodic drinking behavior   Chronic low back pain   Gout   Chronic pain of right knee     Assessment and Plan Assessment & Plan Essential hypertension Blood pressure is elevated at 156/75 mmHg, possibly due to frustration from the visit. No home blood pressure monitoring reported. - Continue current antihypertensive regimen with amlodipine  and losartan .  Chronic kidney disease Kidney function is well-managed with a GFR of 65. improved  Gout Well-controlled with allopurinol  100 mg daily. No  recent gout flares reported. - Continue allopurinol  100 mg daily.  Hyperlipidemia Lipid panel is normal. Current management with atorvastatin  40 mg daily is effective. - Continue atorvastatin  40 mg daily.  Chronic low back pain and chronic right knee pain Chronic  low back and right knee pain are ongoing issues. -handicap sticker from filled out  Alcohol abuse History of alcohol abuse. Advised to reduce alcohol intake to prevent further liver damage. Liver function has improved. - Advised reduction of alcohol intake.        Follow up plan: Return in about 6 months (around 09/14/2024) for follow up.

## 2024-03-17 ENCOUNTER — Ambulatory Visit: Payer: Self-pay | Admitting: Nurse Practitioner

## 2024-06-03 ENCOUNTER — Ambulatory Visit

## 2024-09-15 ENCOUNTER — Ambulatory Visit: Admitting: Nurse Practitioner
